# Patient Record
Sex: Male | Born: 1955 | Race: White | Hispanic: No | Marital: Single | Smoking: Never smoker
Health system: Southern US, Community
[De-identification: ages and names within clinical notes are randomized; demographics above are authoritative.]

## PROBLEM LIST (undated history)

## (undated) DIAGNOSIS — Z8739 Personal history of other diseases of the musculoskeletal system and connective tissue: Secondary | ICD-10-CM

## (undated) DIAGNOSIS — E039 Hypothyroidism, unspecified: Secondary | ICD-10-CM

## (undated) DIAGNOSIS — M1990 Unspecified osteoarthritis, unspecified site: Secondary | ICD-10-CM

## (undated) DIAGNOSIS — S83249A Other tear of medial meniscus, current injury, unspecified knee, initial encounter: Secondary | ICD-10-CM

## (undated) DIAGNOSIS — I10 Essential (primary) hypertension: Secondary | ICD-10-CM

## (undated) DIAGNOSIS — N529 Male erectile dysfunction, unspecified: Secondary | ICD-10-CM

## (undated) DIAGNOSIS — E785 Hyperlipidemia, unspecified: Secondary | ICD-10-CM

## (undated) DIAGNOSIS — G4730 Sleep apnea, unspecified: Secondary | ICD-10-CM

## (undated) DIAGNOSIS — M545 Low back pain, unspecified: Secondary | ICD-10-CM

## (undated) DIAGNOSIS — T7840XA Allergy, unspecified, initial encounter: Secondary | ICD-10-CM

## (undated) DIAGNOSIS — K219 Gastro-esophageal reflux disease without esophagitis: Secondary | ICD-10-CM

## (undated) DIAGNOSIS — N4889 Other specified disorders of penis: Secondary | ICD-10-CM

## (undated) DIAGNOSIS — Z87442 Personal history of urinary calculi: Secondary | ICD-10-CM

## (undated) DIAGNOSIS — G479 Sleep disorder, unspecified: Secondary | ICD-10-CM

## (undated) DIAGNOSIS — Z8719 Personal history of other diseases of the digestive system: Secondary | ICD-10-CM

## (undated) DIAGNOSIS — N189 Chronic kidney disease, unspecified: Secondary | ICD-10-CM

## (undated) DIAGNOSIS — E079 Disorder of thyroid, unspecified: Secondary | ICD-10-CM

## (undated) HISTORY — DX: Essential (primary) hypertension: I10

## (undated) HISTORY — DX: Disorder of thyroid, unspecified: E07.9

## (undated) HISTORY — DX: Chronic kidney disease, unspecified: N18.9

## (undated) HISTORY — DX: Allergy, unspecified, initial encounter: T78.40XA

## (undated) HISTORY — DX: Gastro-esophageal reflux disease without esophagitis: K21.9

## (undated) HISTORY — DX: Low back pain, unspecified: M54.50

## (undated) HISTORY — DX: Other specified disorders of penis: N48.89

## (undated) HISTORY — DX: Low back pain: M54.5

## (undated) HISTORY — DX: Male erectile dysfunction, unspecified: N52.9

## (undated) HISTORY — DX: Hyperlipidemia, unspecified: E78.5

## (undated) HISTORY — PX: TONSILLECTOMY: SUR1361

---

## 1999-01-21 ENCOUNTER — Emergency Department (HOSPITAL_COMMUNITY): Admission: EM | Admit: 1999-01-21 | Discharge: 1999-01-21 | Payer: Self-pay | Admitting: Emergency Medicine

## 2005-06-02 ENCOUNTER — Encounter (INDEPENDENT_AMBULATORY_CARE_PROVIDER_SITE_OTHER): Payer: Self-pay | Admitting: Specialist

## 2005-06-02 ENCOUNTER — Ambulatory Visit (HOSPITAL_BASED_OUTPATIENT_CLINIC_OR_DEPARTMENT_OTHER): Admission: RE | Admit: 2005-06-02 | Discharge: 2005-06-02 | Payer: Self-pay | Admitting: Urology

## 2005-10-01 ENCOUNTER — Inpatient Hospital Stay (HOSPITAL_COMMUNITY): Admission: EM | Admit: 2005-10-01 | Discharge: 2005-10-02 | Payer: Self-pay | Admitting: *Deleted

## 2005-10-01 ENCOUNTER — Ambulatory Visit: Payer: Self-pay | Admitting: *Deleted

## 2005-10-01 ENCOUNTER — Emergency Department (HOSPITAL_COMMUNITY): Admission: EM | Admit: 2005-10-01 | Discharge: 2005-10-01 | Payer: Self-pay | Admitting: Emergency Medicine

## 2009-05-28 ENCOUNTER — Inpatient Hospital Stay (HOSPITAL_COMMUNITY): Admission: EM | Admit: 2009-05-28 | Discharge: 2009-05-29 | Payer: Self-pay | Admitting: Emergency Medicine

## 2009-07-02 ENCOUNTER — Ambulatory Visit: Payer: Self-pay | Admitting: Psychiatry

## 2009-07-02 ENCOUNTER — Inpatient Hospital Stay (HOSPITAL_COMMUNITY): Admission: EM | Admit: 2009-07-02 | Discharge: 2009-07-03 | Payer: Self-pay | Admitting: Emergency Medicine

## 2010-05-05 LAB — COMPREHENSIVE METABOLIC PANEL
ALT: 23 U/L (ref 0–53)
AST: 19 U/L (ref 0–37)
Albumin: 3.4 g/dL — ABNORMAL LOW (ref 3.5–5.2)
Alkaline Phosphatase: 62 U/L (ref 39–117)
BUN: 5 mg/dL — ABNORMAL LOW (ref 6–23)
CO2: 23 mEq/L (ref 19–32)
Calcium: 9.7 mg/dL (ref 8.4–10.5)
Chloride: 116 mEq/L — ABNORMAL HIGH (ref 96–112)
Creatinine, Ser: 0.94 mg/dL (ref 0.4–1.5)
GFR calc Af Amer: >60 mL/min (ref >60)
GFR calc non Af Amer: >60 mL/min (ref >60)
Glucose, Bld: 100 mg/dL — ABNORMAL HIGH (ref 70–99)
Potassium: 3.7 mEq/L (ref 3.5–5.1)
Sodium: 142 mEq/L (ref 135–145)
Total Bilirubin: 0.6 mg/dL (ref 0.3–1.2)
Total Protein: 6 g/dL (ref 6.0–8.3)

## 2010-05-05 LAB — RAPID URINE DRUG SCREEN, HOSP PERFORMED
Amphetamines: NOT DETECTED
Barbiturates: NOT DETECTED
Benzodiazepines: NOT DETECTED
Cocaine: NOT DETECTED
Opiates: POSITIVE — AB
Tetrahydrocannabinol: NOT DETECTED

## 2010-05-05 LAB — HEPATIC FUNCTION PANEL
ALT: 25 U/L (ref 0–53)
ALT: 27 U/L (ref 0–53)
ALT: 31 U/L (ref 0–53)
ALT: 33 U/L (ref 0–53)
AST: 19 U/L (ref 0–37)
AST: 22 U/L (ref 0–37)
AST: 23 U/L (ref 0–37)
AST: 31 U/L (ref 0–37)
Albumin: 3.4 g/dL — ABNORMAL LOW (ref 3.5–5.2)
Albumin: 3.6 g/dL (ref 3.5–5.2)
Albumin: 3.6 g/dL (ref 3.5–5.2)
Albumin: 3.8 g/dL (ref 3.5–5.2)
Alkaline Phosphatase: 58 U/L (ref 39–117)
Alkaline Phosphatase: 60 U/L (ref 39–117)
Alkaline Phosphatase: 61 U/L (ref 39–117)
Alkaline Phosphatase: 70 U/L (ref 39–117)
Bilirubin, Direct: 0.1 mg/dL (ref 0.0–0.3)
Bilirubin, Direct: <0.1 mg/dL (ref 0.0–0.3)
Bilirubin, Direct: <0.1 mg/dL (ref 0.0–0.3)
Bilirubin, Direct: <0.1 mg/dL (ref 0.0–0.3)
Indirect Bilirubin: 0.7 mg/dL (ref 0.3–0.9)
Total Bilirubin: 0.6 mg/dL (ref 0.3–1.2)
Total Bilirubin: 0.8 mg/dL (ref 0.3–1.2)
Total Bilirubin: 0.8 mg/dL (ref 0.3–1.2)
Total Bilirubin: 0.9 mg/dL (ref 0.3–1.2)
Total Protein: 5.9 g/dL — ABNORMAL LOW (ref 6.0–8.3)
Total Protein: 6.2 g/dL (ref 6.0–8.3)
Total Protein: 6.5 g/dL (ref 6.0–8.3)
Total Protein: 6.6 g/dL (ref 6.0–8.3)

## 2010-05-05 LAB — BASIC METABOLIC PANEL
BUN: 13 mg/dL (ref 6–23)
CO2: 26 mEq/L (ref 19–32)
Calcium: 10.2 mg/dL (ref 8.4–10.5)
Chloride: 106 mEq/L (ref 96–112)
Creatinine, Ser: 1.2 mg/dL (ref 0.4–1.5)
GFR calc Af Amer: >60 mL/min (ref >60)
GFR calc non Af Amer: >60 mL/min (ref >60)
Glucose, Bld: 121 mg/dL — ABNORMAL HIGH (ref 70–99)
Potassium: 4.1 mEq/L (ref 3.5–5.1)
Sodium: 135 mEq/L (ref 135–145)

## 2010-05-05 LAB — CBC
HCT: 37 % — ABNORMAL LOW (ref 39.0–52.0)
HCT: 40 % (ref 39.0–52.0)
Hemoglobin: 12.4 g/dL — ABNORMAL LOW (ref 13.0–17.0)
Hemoglobin: 13.7 g/dL (ref 13.0–17.0)
MCHC: 33.6 g/dL (ref 30.0–36.0)
MCHC: 34.3 g/dL (ref 30.0–36.0)
MCV: 86.7 fL (ref 78.0–100.0)
MCV: 87.3 fL (ref 78.0–100.0)
Platelets: 113 10*3/uL — ABNORMAL LOW (ref 150–400)
Platelets: 132 10*3/uL — ABNORMAL LOW (ref 150–400)
RBC: 4.24 MIL/uL (ref 4.22–5.81)
RBC: 4.62 MIL/uL (ref 4.22–5.81)
RDW: 14.3 % (ref 11.5–15.5)
RDW: 14.4 % (ref 11.5–15.5)
WBC: 6.3 10*3/uL (ref 4.0–10.5)
WBC: 6.6 10*3/uL (ref 4.0–10.5)

## 2010-05-05 LAB — DIFFERENTIAL
Basophils Absolute: 0 10*3/uL (ref 0.0–0.1)
Basophils Relative: 0 % (ref 0–1)
Eosinophils Absolute: 0 10*3/uL (ref 0.0–0.7)
Eosinophils Relative: 1 % (ref 0–5)
Lymphocytes Relative: 20 % (ref 12–46)
Lymphs Abs: 1.3 10*3/uL (ref 0.7–4.0)
Monocytes Absolute: 0.5 10*3/uL (ref 0.1–1.0)
Monocytes Relative: 7 % (ref 3–12)
Neutro Abs: 4.7 10*3/uL (ref 1.7–7.7)
Neutrophils Relative %: 72 % (ref 43–77)

## 2010-05-05 LAB — SALICYLATE LEVEL: Salicylate Lvl: <4 mg/dL (ref 2.8–20.0)

## 2010-05-05 LAB — PROTIME-INR
INR: 1.12 (ref 0.00–1.49)
INR: 1.13 (ref 0.00–1.49)
Prothrombin Time: 14.3 seconds (ref 11.6–15.2)
Prothrombin Time: 14.4 seconds (ref 11.6–15.2)

## 2010-05-05 LAB — ACETAMINOPHEN LEVEL
Acetaminophen (Tylenol), Serum: 10.7 ug/mL (ref 10–30)
Acetaminophen (Tylenol), Serum: 68.5 ug/mL — ABNORMAL HIGH (ref 10–30)
Acetaminophen (Tylenol), Serum: <10 ug/mL — ABNORMAL LOW (ref 10–30)

## 2010-05-05 LAB — APTT: aPTT: 34 seconds (ref 24–37)

## 2010-05-05 LAB — ETHANOL: Alcohol, Ethyl (B): <5 mg/dL (ref 0–10)

## 2010-05-07 LAB — LIPASE, BLOOD: Lipase: 30 U/L (ref 11–59)

## 2010-05-07 LAB — BASIC METABOLIC PANEL WITH GFR
BUN: 13 mg/dL (ref 6–23)
BUN: 14 mg/dL (ref 6–23)
CO2: 22 meq/L (ref 19–32)
CO2: 27 meq/L (ref 19–32)
Calcium: 10.5 mg/dL (ref 8.4–10.5)
Calcium: 11.1 mg/dL — ABNORMAL HIGH (ref 8.4–10.5)
Chloride: 109 meq/L (ref 96–112)
Chloride: 109 meq/L (ref 96–112)
Creatinine, Ser: 1.2 mg/dL (ref 0.4–1.5)
Creatinine, Ser: 1.32 mg/dL (ref 0.4–1.5)
GFR calc non Af Amer: 57 mL/min — ABNORMAL LOW
GFR calc non Af Amer: >60 mL/min
Glucose, Bld: 103 mg/dL — ABNORMAL HIGH (ref 70–99)
Glucose, Bld: 112 mg/dL — ABNORMAL HIGH (ref 70–99)
Potassium: 3.8 meq/L (ref 3.5–5.1)
Potassium: 4.6 meq/L (ref 3.5–5.1)
Sodium: 139 meq/L (ref 135–145)
Sodium: 141 meq/L (ref 135–145)

## 2010-05-07 LAB — URINALYSIS, ROUTINE W REFLEX MICROSCOPIC
Bilirubin Urine: NEGATIVE
Glucose, UA: NEGATIVE mg/dL
Hgb urine dipstick: NEGATIVE
Ketones, ur: NEGATIVE mg/dL
Nitrite: NEGATIVE
Protein, ur: NEGATIVE mg/dL
Specific Gravity, Urine: 1.006 (ref 1.005–1.030)
Urobilinogen, UA: 0.2 mg/dL (ref 0.0–1.0)
pH: 7 (ref 5.0–8.0)

## 2010-05-07 LAB — HEPATIC FUNCTION PANEL
ALT: 23 U/L (ref 0–53)
AST: 23 U/L (ref 0–37)
Albumin: 3.7 g/dL (ref 3.5–5.2)
Alkaline Phosphatase: 69 U/L (ref 39–117)
Bilirubin, Direct: <0.1 mg/dL (ref 0.0–0.3)
Total Bilirubin: 0.7 mg/dL (ref 0.3–1.2)
Total Protein: 5.6 g/dL — ABNORMAL LOW (ref 6.0–8.3)

## 2010-05-07 LAB — CARDIAC PANEL(CRET KIN+CKTOT+MB+TROPI)
CK, MB: 2.3 ng/mL (ref 0.3–4.0)
CK, MB: 2.8 ng/mL (ref 0.3–4.0)
Relative Index: 0.5 (ref 0.0–2.5)
Relative Index: 1.1 (ref 0.0–2.5)
Total CK: 257 U/L — ABNORMAL HIGH (ref 7–232)
Total CK: 493 U/L — ABNORMAL HIGH (ref 7–232)
Troponin I: 0.02 ng/mL (ref 0.00–0.06)
Troponin I: <0.01 ng/mL (ref 0.00–0.06)

## 2010-05-07 LAB — CK TOTAL AND CKMB (NOT AT ARMC)
CK, MB: 3.3 ng/mL (ref 0.3–4.0)
Relative Index: 1.2 (ref 0.0–2.5)
Total CK: 275 U/L — ABNORMAL HIGH (ref 7–232)

## 2010-05-07 LAB — DIFFERENTIAL
Basophils Absolute: 0 10*3/uL (ref 0.0–0.1)
Basophils Relative: 1 % (ref 0–1)
Eosinophils Absolute: 0.1 10*3/uL (ref 0.0–0.7)
Eosinophils Relative: 1 % (ref 0–5)
Lymphocytes Relative: 25 % (ref 12–46)
Lymphs Abs: 2.2 10*3/uL (ref 0.7–4.0)
Monocytes Absolute: 0.5 10*3/uL (ref 0.1–1.0)
Monocytes Relative: 6 % (ref 3–12)
Neutro Abs: 6.1 10*3/uL (ref 1.7–7.7)
Neutrophils Relative %: 68 % (ref 43–77)

## 2010-05-07 LAB — CBC
HCT: 38.5 % — ABNORMAL LOW (ref 39.0–52.0)
HCT: 39.7 % (ref 39.0–52.0)
Hemoglobin: 13.3 g/dL (ref 13.0–17.0)
Hemoglobin: 13.4 g/dL (ref 13.0–17.0)
MCHC: 33.8 g/dL (ref 30.0–36.0)
MCHC: 34.7 g/dL (ref 30.0–36.0)
MCV: 86.7 fL (ref 78.0–100.0)
MCV: 87.1 fL (ref 78.0–100.0)
Platelets: 123 10*3/uL — ABNORMAL LOW (ref 150–400)
Platelets: 134 10*3/uL — ABNORMAL LOW (ref 150–400)
RBC: 4.42 MIL/uL (ref 4.22–5.81)
RBC: 4.57 MIL/uL (ref 4.22–5.81)
RDW: 14.1 % (ref 11.5–15.5)
RDW: 14.3 % (ref 11.5–15.5)
WBC: 8 10*3/uL (ref 4.0–10.5)
WBC: 8.9 10*3/uL (ref 4.0–10.5)

## 2010-05-07 LAB — POCT CARDIAC MARKERS
CKMB, poc: 2.2 ng/mL (ref 1.0–8.0)
CKMB, poc: 2.3 ng/mL (ref 1.0–8.0)
Myoglobin, poc: 145 ng/mL (ref 12–200)
Myoglobin, poc: 189 ng/mL (ref 12–200)
Troponin i, poc: <0.05 ng/mL (ref 0.00–0.09)
Troponin i, poc: <0.05 ng/mL (ref 0.00–0.09)

## 2010-05-07 LAB — TROPONIN I: Troponin I: 0.02 ng/mL (ref 0.00–0.06)

## 2010-07-04 NOTE — Op Note (Signed)
NAME:  Thomas Pham, Thomas Pham                ACCOUNT NO.:  [id number]   MEDICAL RECORD NO.:  [id number]          PATIENT TYPE:  AMB   LOCATION:  [facility]                         FACILITY:  [facility]   PHYSICIAN:  Boston Service, M.D.DATE OF BIRTH:  Jun 22, 1955   DATE OF PROCEDURE:  06/02/2005  DATE OF DISCHARGE:                                 OPERATIVE REPORT   PREOPERATIVE DIAGNOSIS:  Recurrent infected sebaceous cyst penile shaft.   POSTOPERATIVE DIAGNOSIS:  Recurrent infected sebaceous cyst penile shaft.   PROCEDURE:  Redo circumcision.   SURGEON:  Boston Service, M.D.   ASSISTANT:  None.   ANESTHESIA:  IV sedation and a penile block.   SPECIMENS:  Infected sebaceous cyst.   ESTIMATED BLOOD LOSS:  Minimal.   COMPLICATIONS:  None obvious.   DESCRIPTION OF PROCEDURE:  55 year old male presented to the office May 18, 2005, with a history of a recurrent infected sebaceous cyst distal penile  shaft, has varied in size from about 2.5 to 1.5 cm. No obvious evidence of  inguinal adenopathy. Bilaterally descended testes.  DRE shows a nontender  non nodular prostate.  After a lengthy discussion with the patient and  review of what I think are reasonable risks, benefits and alternatives, he  elected excision of the sebaceous cyst with redo circumcision.   DESCRIPTION OF PROCEDURE:  The patient was prepped and draped in the supine  position after an institution of adequate level of intravenous sedation.  A  penile block with 0.25% lidocaine without epinephrine was instituted.  Once  the block was allowed to set up, a circumferential incision was made  proximal to the infected sebaceous cyst, a similar incision was made  proximal to the original incision, and a ring of densely fibrotic skin was  removed  in a parallel lines technique.  The subcutaneous tissue was reapproximated  with interrupted sutures of 3-0 Vicryl and the skin was reapproximated with  interrupted sutures of 3-0 chromic.   The wound was covered with bacitracin  ointment, dry gauze, and Coban tape.  The patient was returned to recovery  in satisfactory condition.           ______________________________  Boston Service, M.D.     RH/MEDQ  D:  06/02/2005  T:  06/02/2005  Job:  [id number]   cc:   Dellis Anes. Idell Pickles, M.D.  Fax: [phone]

## 2010-07-04 NOTE — Discharge Summary (Signed)
NAME:  Thomas Pham, BRUNSMAN NO.:  [id number]   MEDICAL RECORD NO.:  [id number]          PATIENT TYPE:  IPS   LOCATION:  0301                          FACILITY:  [facility]   PHYSICIAN:  Jasmine Pang, M.D. DATE OF BIRTH:  September 09, 1955   DATE OF ADMISSION:  10/01/2005  DATE OF DISCHARGE:  10/02/2005                                 DISCHARGE SUMMARY   IDENTIFYING INFORMATION:  The patient is a 55 year old single Caucasian  male, who was admitted on a voluntary basis on October 01, 2005, to my  service.   HISTORY OF PRESENT ILLNESS:  The patient has had a history of depression  recently.  He has had increased stress with financial problems.  He also had  conflict with his best friend, who is a male.  He feels that this caused  irreparable damage.  He feels hopeless and helpless over the end of the  relationship.  He has been having passive suicidal ideation.  He states that  the male he has the relationship with has been in rehab before and is only  81 years old.  He states he met her at a bar.  He reports that neither her  family nor his family like the relationship.  The patient also has some  legal charges that are of unclear nature.  He is currently wearing an ankle  bracelet and has to be monitored, 24/7, by his parole officer.  He was vague  when talking about the legal issues.   PAST PSYCHIATRIC HISTORY:  This is the patient's first [facility] admission.  The  patient does see Abel Presto for therapy at [facility].   FAMILY HISTORY:  The patient denies.   SUBSTANCE ABUSE HISTORY:  The patient is a nonsmoker.  He denies any abuse  of drugs or alcohol.   PAST MEDICAL HISTORY:  Medical problems:  Hypertension, hyperlipidemia.   MEDICATIONS:  1. Micardis.  2. Baby aspirin 81 mg.  3. Lipitor 10 mg.   DRUG ALLERGIES:  No known drug allergies.   POSITIVE PHYSICAL FINDINGS:  GENERAL:  The patient was a middle-aged male,  in no acute  distress, wearing a black ankle bracelet.  His physical exam was  done in [facility].  He was found to be grossly healthy.   ADMISSION LABORATORY DATA:  CBC was within normal limits.  Alcohol was less  than 5.  UDS was negative.  BMET was within normal limits.   HOSPITAL COURSE:  Upon admission, the patient was continued on his Lipitor  10 mg p.o. daily, aspirin 81 mg p.o. daily and Micardis/hydrochlorothiazide  40/12.5 mg.  On October 01, 2005, he was also started on Ambien 10 mg p.o.  q.h.s. p.r.n. insomnia.  He stated he has had a significant problem with  insomnia.  He tolerated these medications well and particularly was happy  with getting a good night's sleep.  The patient felt better on the second  day of admission.  He again reiterated he had no intention to harm himself.  He discussed his financial problems and his  problem with his male best  friend.  He feels their relationship is possibly over.  He states he became  upset and stressed.  He made a statement to his friend that, if he was going  to hurt himself, how I would do it.  He states he would never harm himself  and had no intention of doing this.  He was being somewhat manipulative with  his friend, who he was frustrated with.  He has a lot of support from  family.  He also has a lot of support from his work at [employer].  His  parents contacted him last night and stated they would help him financially  with some of this burden.  This was a relief for him.  At the time of  discharge, the patient's mental status had improved markedly.  Mood was less  depressed and anxious, affect wider range, no suicidal and homicidal  ideation, no self-injurious behavior, no auditory or visual hallucinations,  no paranoia or delusions.  Thought processes were logical, linear and goal-  directed.  Thought content revolved still around his male friend, but he  stated he was not going to try to contact her.  She is also out of town  for  a period of time.  He will return home to live with his family.  Cognitive  was grossly within normal limits.  He will return to therapy with Abel Presto  and be assigned a psychiatrist in that practice.  I did not start an  antidepressant during this brief admission, since I would not have a way to  follow up on any possible side effects.   DISCHARGE DIAGNOSES:  AXIS I.  1. Depressive disorder, NOS.  2. Anxiety disorder, NOS.  AXIS II.  None.  AXIS III.  Hypertension, hypercholesterolemia.  AXIS IV.  Severe (economic problem, problems related to the legal system,  other psychosocial problems).  AXIS V.  Current GAF is 43.  GAF upon admission was 35.  GAF highest past  year was 70-75.   DISCHARGE PLANS:  There were no specific activity level or dietary  restrictions.   DISCHARGE MEDICATIONS:  1. Lipitor 10 mg every 6 p.m.  2. Aspirin 81 mg daily.  3. Avapro 150 mg daily.  4. Hydrochlorothiazide 12.5 mg in the a.m.  5. Ambien 10 mg at bedtime as needed for sleep.  6. Klonopin 0.5 mg one pill up to three times daily, if needed for      anxiety.   POST-HOSPITAL CARE PLANS:  The patient has an appointment with Abel Presto,  therapist, on October 06, 2005, at 3 p.m.  He will also be assigned a  psychiatrist in this practice.      Jasmine Pang, M.D.  Electronically Signed     BHS/MEDQ  D:  10/02/2005  T:  10/02/2005  Job:  [id number]

## 2011-02-17 HISTORY — PX: THYROIDECTOMY: SHX17

## 2011-05-31 IMAGING — CR DG CHEST 2V
2 series · 2 of 2 positions shown · non-contrast
Comparison: None

CLINICAL DATA: Chest pain.

CHEST - 2 VIEW

[t chest supine]
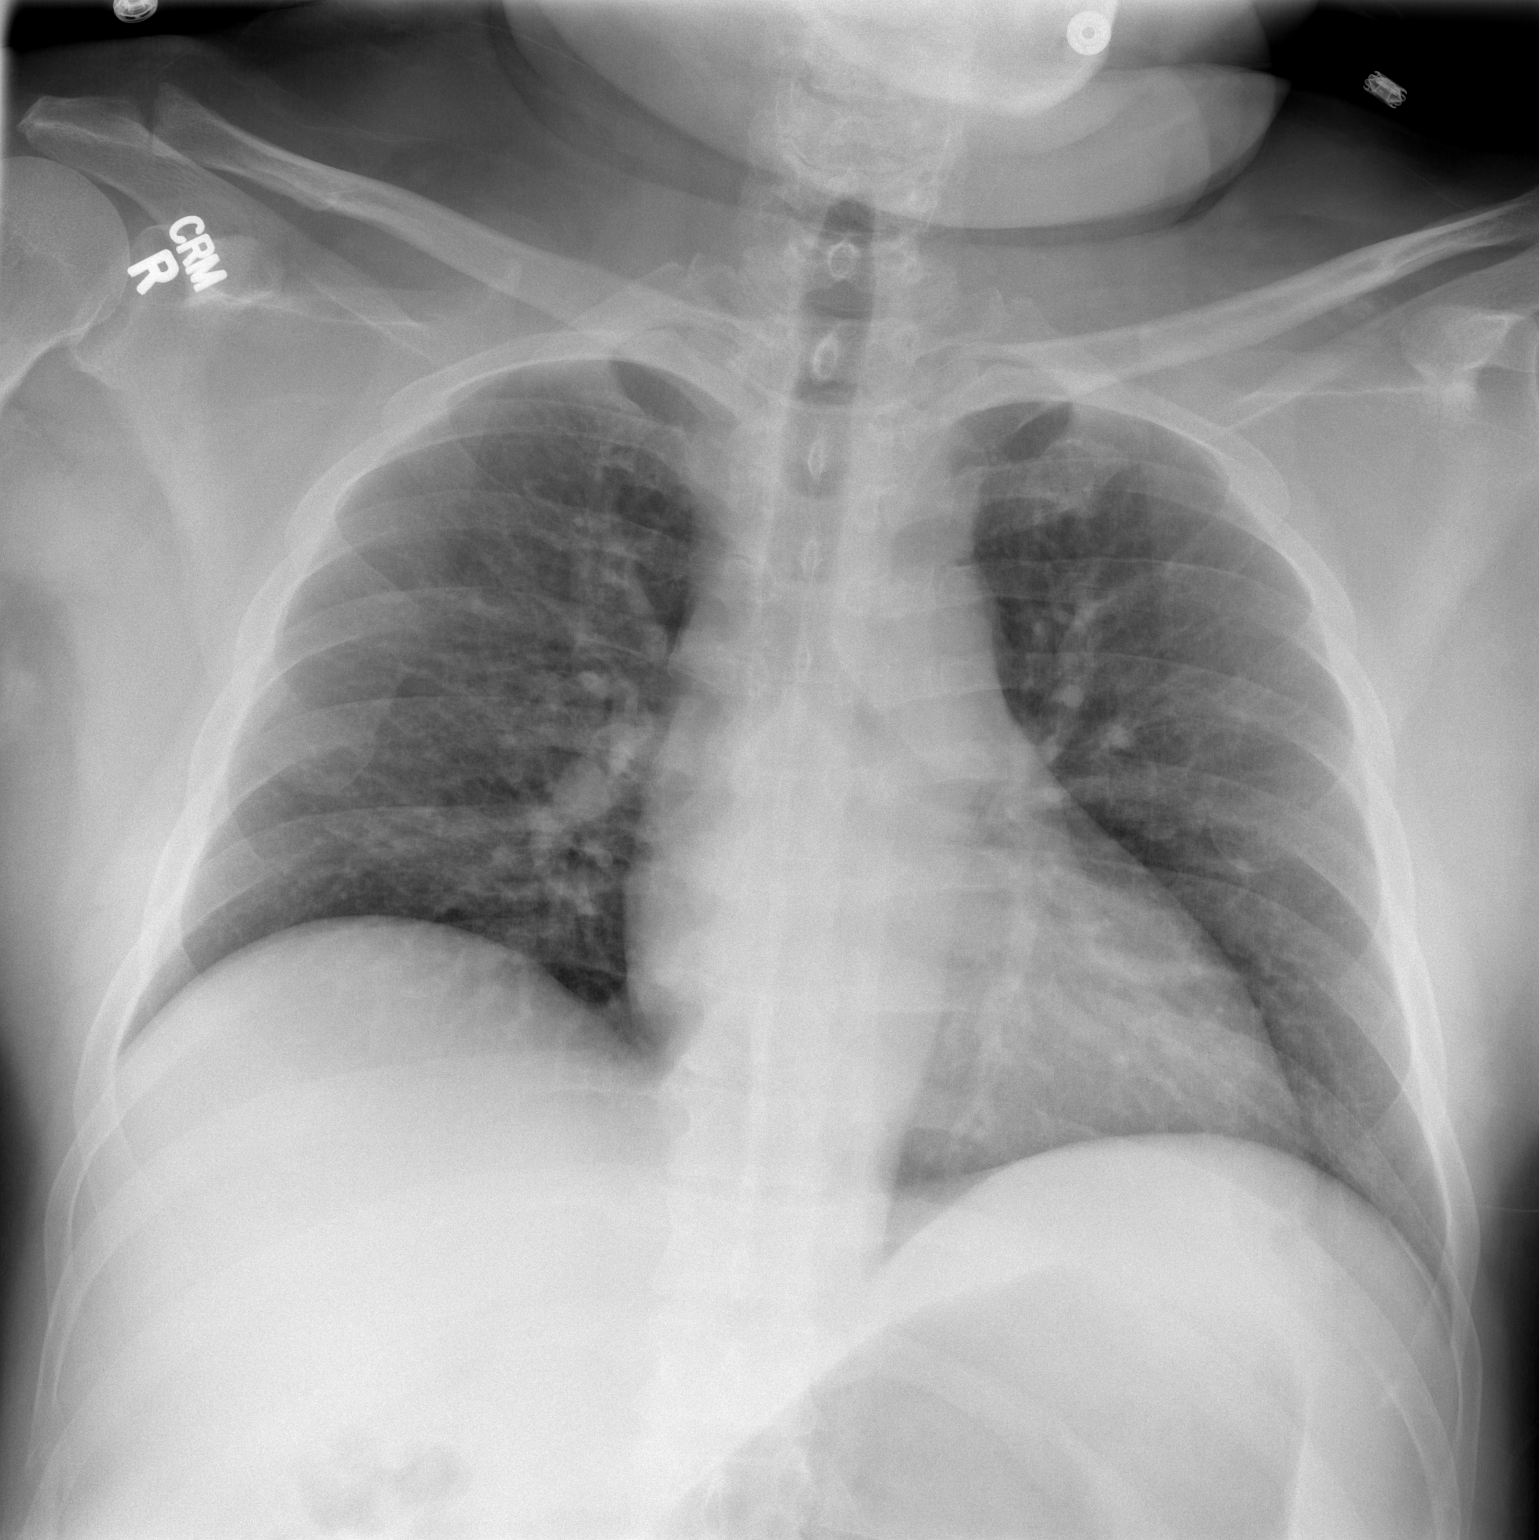

[t chest supine *]
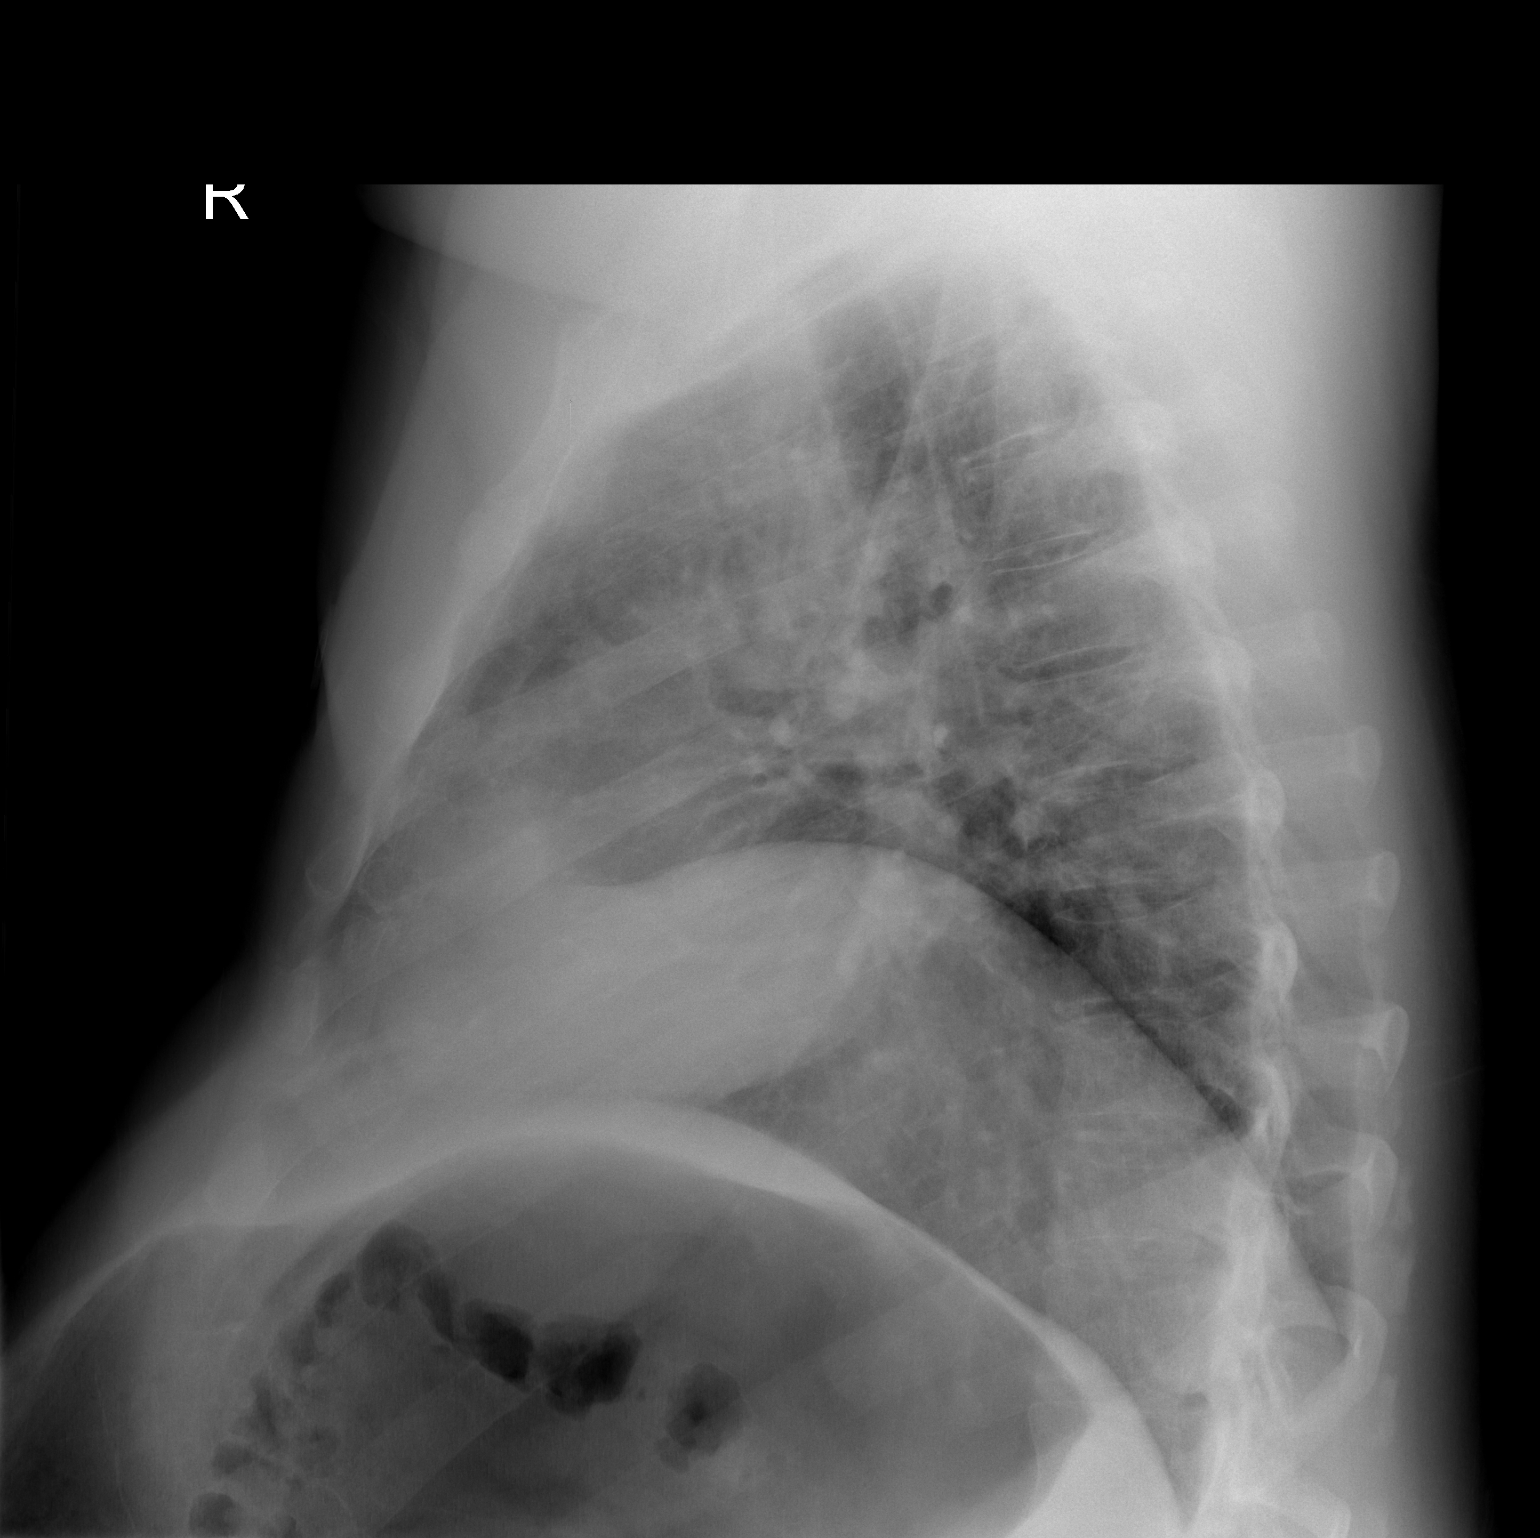

[2 of 2 positions shown; findings below may reference images not displayed]

FINDINGS: The cardiac silhouette, mediastinal and hilar contours
are within normal limits.  The lungs are clear.  The bony thorax is
intact.  Gaseous distention of the stomach is noted.
IMPRESSION: No acute cardiopulmonary findings.
Gaseous distention of the stomach.

## 2011-05-31 IMAGING — CT CT ABD-PELV W/ CM
2 of 5 series · 17 of 46 positions shown, 19 images · IV contrast (agent unspecified)
Comparison: None

CLINICAL DATA: Abdominal pain.

CT ABDOMEN AND PELVIS WITH CONTRAST
TECHNIQUE: Multidetector CT imaging of the abdomen and pelvis was
performed following the standard protocol during bolus
administration of intravenous contrast.
Contrast: 100 ml of Hmnipaque-OVV

[Series 2: routine abdomen · axial · 0.82mm/px · z∈[-492,-42]mm · 14 of 101 slices shown, 16 images]
[im 6/101  soft-tissue]
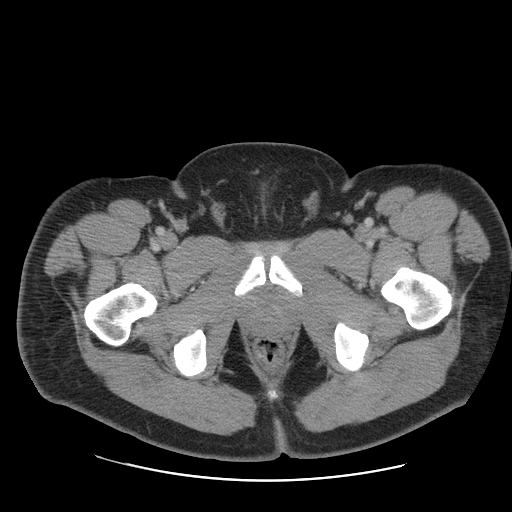
[im 6/101  bone]
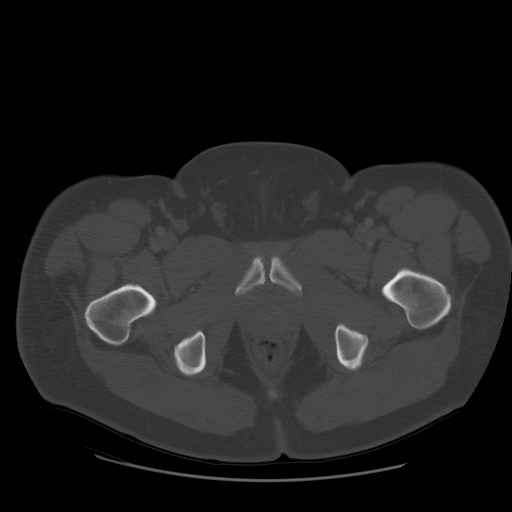
[im 16/101  soft-tissue]
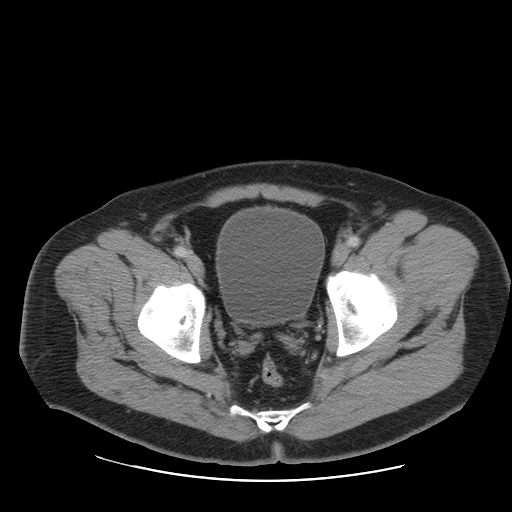
[im 21/101  soft-tissue]
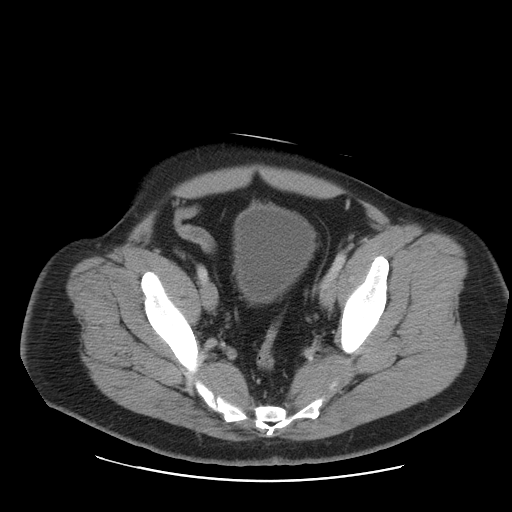
[im 26/101  soft-tissue]
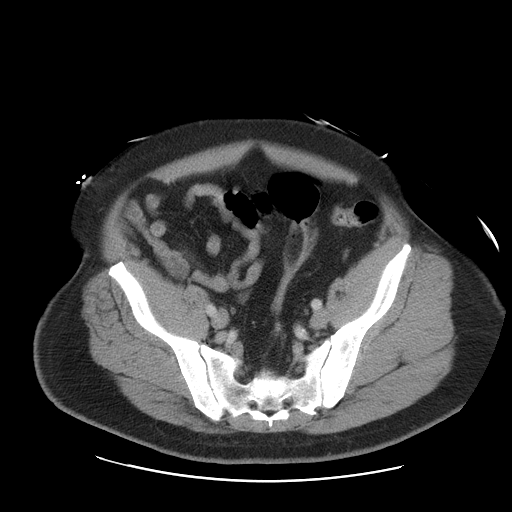
[im 36/101  soft-tissue]
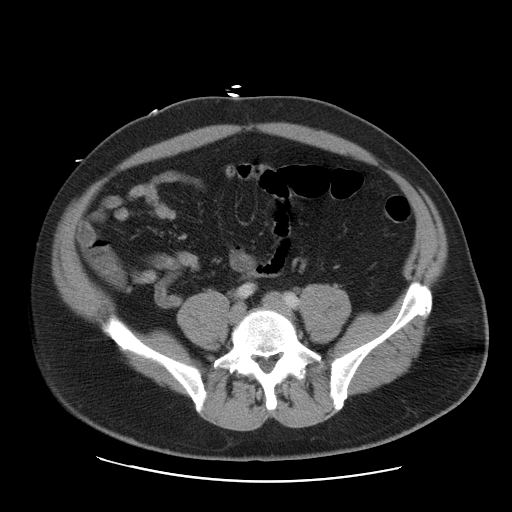
[im 41/101  soft-tissue]
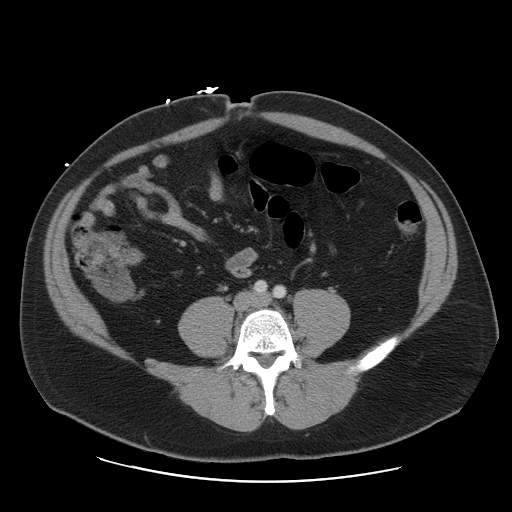
[im 46/101  soft-tissue]
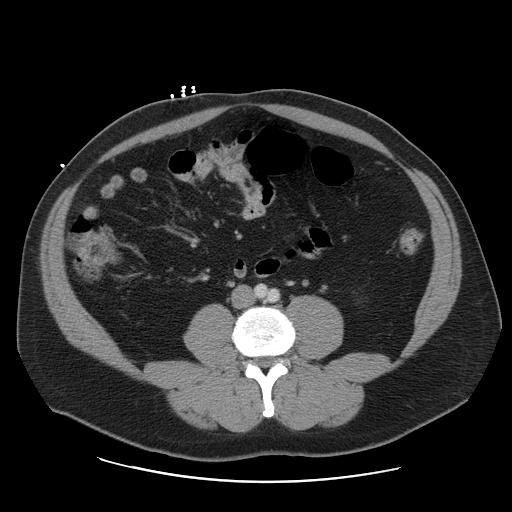
[im 56/101  soft-tissue]
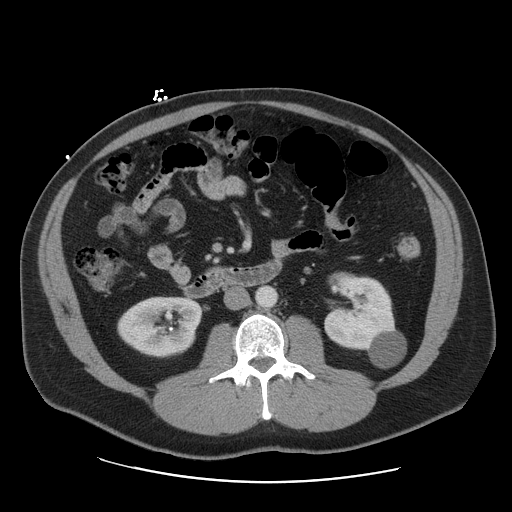
[im 61/101  soft-tissue]
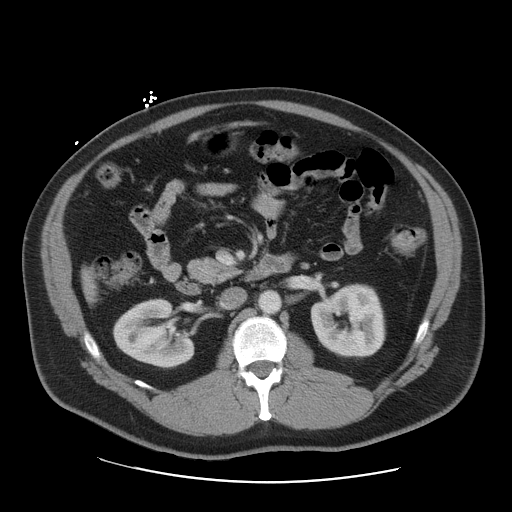
[im 61/101  bone]
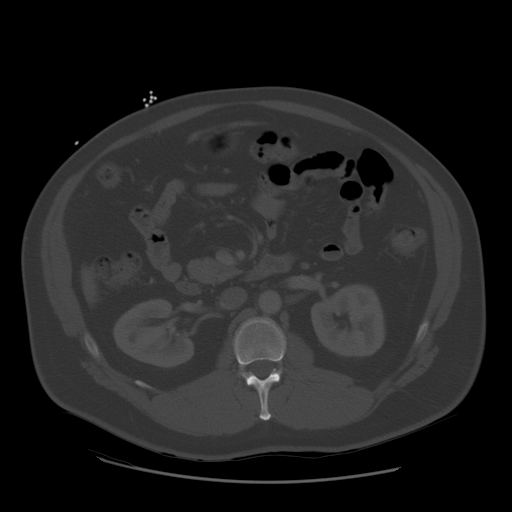
[im 66/101  soft-tissue]
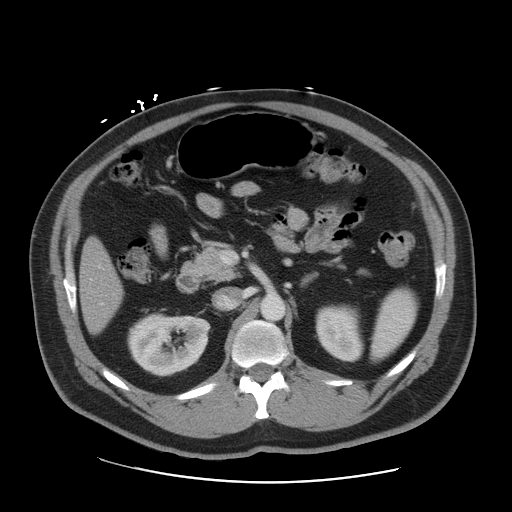
[im 76/101  soft-tissue]
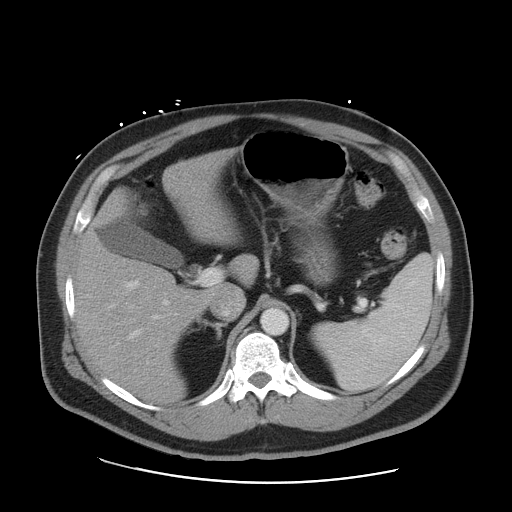
[im 81/101  soft-tissue]
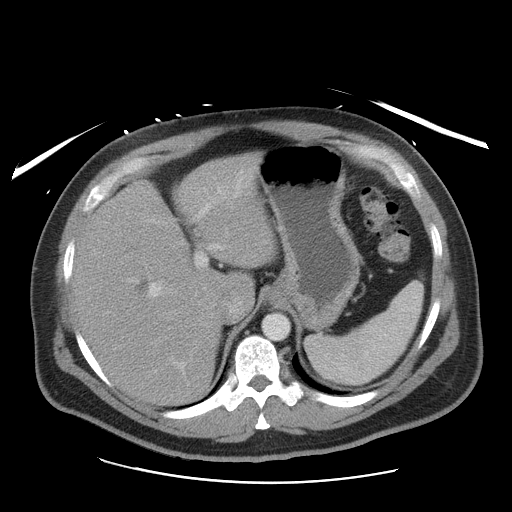
[im 86/101  soft-tissue]
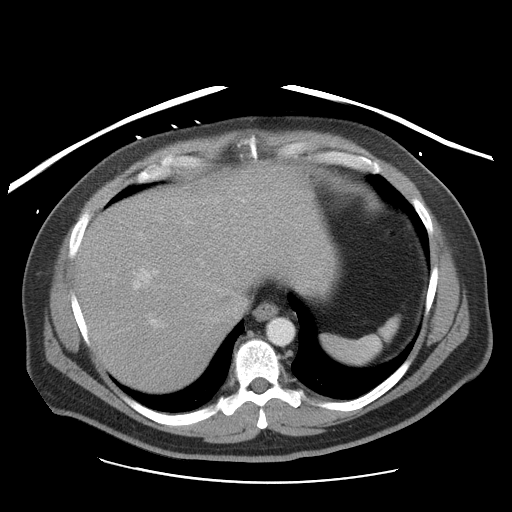
[im 96/101  soft-tissue]
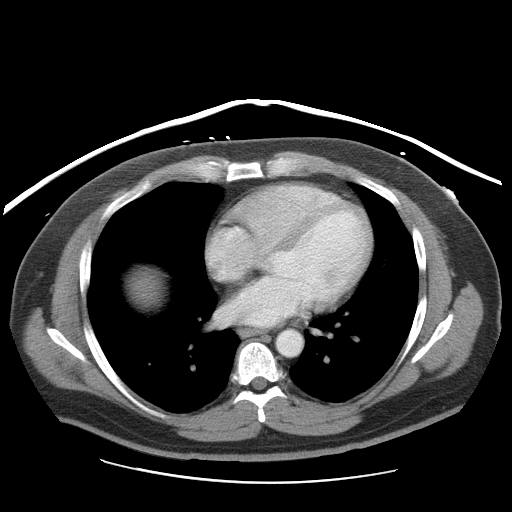

[Series 401: reformatted · coronal · 1.05mm/px · 3 of 135 slices shown]
[im 45/135  soft-tissue]
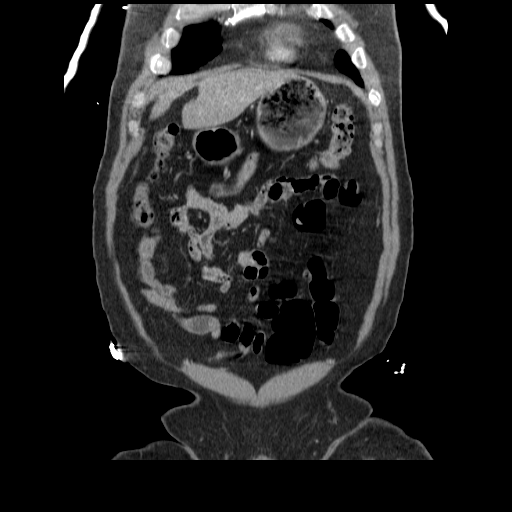
[im 60/135  soft-tissue]
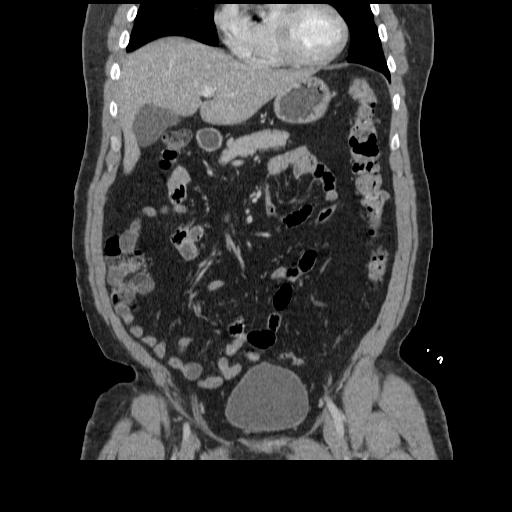
[im 75/135  soft-tissue]
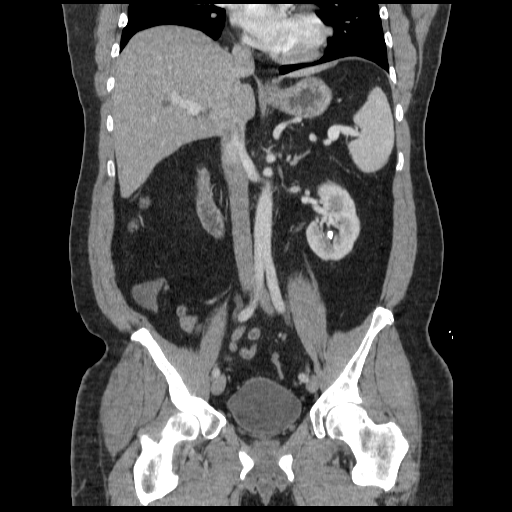

[17 of 46 positions shown; findings below may reference images not displayed]

FINDINGS: The lung bases are clear except for dependent
atelectasis.

There is diffuse fatty infiltration of the liver and a few small
scattered low attenuation lesions which are likely benign cysts.
No biliary dilatation.  The gallbladder appears normal.  The
pancreas is normal.  The spleen is normal in size.  The adrenal
glands and kidneys are unremarkable except for small bilateral
renal calculi and a left renal cyst.  No hydronephrosis.

The stomach, duodenum, small bowel and colon are unremarkable.  No
mesenteric or retroperitoneal masses or adenopathy.  There are
scattered colonic diverticuli.  The appendix is normal.

The aorta is normal in caliber.  The major branch vessels are
normal.  No retroperitoneal process.

The bladder appears normal.  Prostate gland and seminal vesicles
are normal.  No inguinal mass or hernia.

The bony structures are unremarkable.
IMPRESSION: No acute abdominal/pelvic findings, mass lesions or adenopathy.
Bilateral renal calculi.
Mild diffuse fatty infiltration of the liver.

## 2012-07-08 ENCOUNTER — Encounter: Payer: Self-pay | Admitting: Vascular Surgery

## 2012-08-22 ENCOUNTER — Encounter: Payer: Self-pay | Admitting: Vascular Surgery

## 2012-08-23 ENCOUNTER — Ambulatory Visit (INDEPENDENT_AMBULATORY_CARE_PROVIDER_SITE_OTHER): Payer: BC Managed Care – PPO | Admitting: Vascular Surgery

## 2012-08-23 ENCOUNTER — Encounter: Payer: Self-pay | Admitting: Vascular Surgery

## 2012-08-23 VITALS — BP 124/84 | HR 87 | Resp 18 | Ht 68.0 in | Wt 238.0 lb

## 2012-08-23 DIAGNOSIS — R238 Other skin changes: Secondary | ICD-10-CM

## 2012-08-23 DIAGNOSIS — R209 Unspecified disturbances of skin sensation: Secondary | ICD-10-CM | POA: Insufficient documentation

## 2012-08-23 NOTE — Progress Notes (Signed)
[facility] [place]   Patient name: Thomas Pham MRN: [id number] DOB: 1955-03-09 Sex: male   Referred by: Creta Levin  Reason for referral:  Chief Complaint  Patient presents with  . New Evaluation    Patient is c/o coldness in hands, feet and groin areas. Referred by Dr Tonna Corner    HISTORY OF PRESENT ILLNESS: The patient presents today for complaints of cold sensation in his hands feet and occasionally in his groin and inner thigh. He specifically denies any numbness or pain or tingling associated with this. He reports that in the wintertime he will have to keep the heater in his vehicle on high. He feels that he is hot everywhere but his fever remained cold. He can have the sensation in the summertime to reports that he is to wear extra blankets and even socks and the summertime to keep his feet were warm. He does not recall his feet feeling cold to the touch when he has a cold sensation. He specifically denies any color changes associated with his hands or feet during these episodes. He has no tissue loss.  Past Medical History  Diagnosis Date  . Chronic kidney disease   . Allergy   . Hypertension   . Thyroid disease   . GERD (gastroesophageal reflux disease)   . Hyperlipidemia   . Organic impotence   . Penile cyst   . Low back pain     Past Surgical History  Procedure Laterality Date  . Thyroidectomy Right   . Tonsillectomy      History   Social History  . Marital Status: Single    Spouse Name: N/A    Number of Children: N/A  . Years of Education: N/A   Occupational History  . Not on file.   Social History Main Topics  . Smoking status: Never Smoker   . Smokeless tobacco: Never Used  . Alcohol Use: No  . Drug Use: No  . Sexually Active: Not on file   Other Topics Concern  . Not on file   Social History Narrative  . No narrative on file    Family History  Problem Relation Age of Onset  . Hypertension Mother   . Diabetes  Mother     prediabetes  . Diabetes Father   . Heart disease Father   . Arthritis Father   . Other Father     alzheimer  . Hyperlipidemia Father   . Hypertension Father   . Stroke Maternal Grandmother   . Other Paternal Grandmother   . Diabetes Paternal Grandmother   . Heart disease Paternal Grandfather   . Other Paternal Grandfather     migraine headache    Allergies as of 08/23/2012  . (No Known Allergies)    Current Outpatient Prescriptions on File Prior to Visit  Medication Sig Dispense Refill  . aspirin 81 MG tablet Take 81 mg by mouth daily.      . Avanafil (STENDRA) 100 MG TABS Take 100 mg by mouth once.      . Calcium Carbonate-Vitamin D (OSCAL 500/200 D-3 PO) Take 500 mg by mouth daily.      Marland Kitchen HYDROcodone-acetaminophen (NORCO/VICODIN) 5-325 MG per tablet Take 1 tablet by mouth every 6 (six) hours as needed for pain.      . sildenafil (VIAGRA) 100 MG tablet Take 100 mg by mouth daily as needed for erectile dysfunction.      . simvastatin (ZOCOR) 20 MG tablet Take 20 mg by mouth  every evening.      Marland Kitchen amLODipine (NORVASC) 5 MG tablet Take 5 mg by mouth daily.       No current facility-administered medications on file prior to visit.     REVIEW OF SYSTEMS:  Positives indicated with an "X"  CARDIOVASCULAR:  [ ]  chest pain   [ ]  chest pressure   [ ]  palpitations   [ ]  orthopnea   [ ]  dyspnea on exertion   [ ]  claudication   [ ]  rest pain   [ ]  DVT   [ ]  phlebitis PULMONARY:   [ ]  productive cough   [ ]  asthma   [ ]  wheezing NEUROLOGIC:   [ ]  weakness  [ ]  paresthesias  [ ]  aphasia  [ ]  amaurosis  [ ]  dizziness HEMATOLOGIC:   [ ]  bleeding problems   [ ]  clotting disorders MUSCULOSKELETAL:  [ ]  joint pain   [ ]  joint swelling GASTROINTESTINAL: [ ]   blood in stool  [ ]   hematemesis GENITOURINARY:  [ ]   dysuria  [ ]   hematuria PSYCHIATRIC:  [ ]  history of major depression INTEGUMENTARY:  [x ] rashes  [ ]  ulcers CONSTITUTIONAL:  [ ]  fever   [x ] chills  PHYSICAL  EXAMINATION:  General: The patient is a well-nourished male, in no acute distress. Vital signs are BP 124/84  Pulse 87  Resp 18  Ht 5\' 8"  (1.727 m)  Wt 238 lb (107.956 kg)  BMI 36.2 kg/m2 Pulmonary: There is a good air exchange bilaterally without wheezing or rales. Abdomen: Soft and non-tender with normal pitch bowel sounds. Musculoskeletal: There are no major deformities.  There is no significant extremity pain. Neurologic: No focal weakness or paresthesias are detected, Skin: There are no ulcer or rashes noted. Psychiatric: The patient has normal affect. Cardiovascular: There is a regular rate and rhythm without significant murmur appreciated. Pulse status: 2+ radial 2+ femoral 2+ dorsalis pedis and 2+ posterior tibial pulses bilaterally Skin without any cyanosis or tissue loss    Impression and Plan:  I discussed this with the patient. I explained that he has no evidence of arterial insufficiency with a totally normal a peripheral stick vascular exam. He also does not have a clear-cut vasospastic disorder such as Raynaud. He has no color changes in his feet and hands are not cold to the touch when he has a cold sensation. I explained this is not a dangerous situation. He was concerned it to severe peripheral vascular disease in his father. I suspect that he will not have a clear-cut etiology for this. He will followup with his medical doctor for further evaluation. This does not appear to be a neuropathy since his symptom is cold and not numbness. He was reassured this discussion will see [place] again on an as-needed basis    Amyrie Illingworth [facility] [place] Office: [phone]

## 2012-09-05 ENCOUNTER — Other Ambulatory Visit: Payer: Self-pay | Admitting: Dermatology

## 2012-10-20 ENCOUNTER — Other Ambulatory Visit: Payer: Self-pay | Admitting: Dermatology

## 2012-11-29 ENCOUNTER — Emergency Department (HOSPITAL_COMMUNITY)
Admission: EM | Admit: 2012-11-29 | Discharge: 2012-11-29 | Disposition: A | Discharge status: Home / Self Care | Payer: BC Managed Care – PPO | Attending: Emergency Medicine | Admitting: Emergency Medicine

## 2012-11-29 ENCOUNTER — Emergency Department (HOSPITAL_COMMUNITY): Payer: BC Managed Care – PPO

## 2012-11-29 ENCOUNTER — Encounter (HOSPITAL_COMMUNITY): Payer: Self-pay | Admitting: Emergency Medicine

## 2012-11-29 DIAGNOSIS — J3489 Other specified disorders of nose and nasal sinuses: Secondary | ICD-10-CM | POA: Insufficient documentation

## 2012-11-29 DIAGNOSIS — T391X1A Poisoning by 4-Aminophenol derivatives, accidental (unintentional), initial encounter: Secondary | ICD-10-CM | POA: Insufficient documentation

## 2012-11-29 DIAGNOSIS — R0602 Shortness of breath: Secondary | ICD-10-CM | POA: Insufficient documentation

## 2012-11-29 DIAGNOSIS — Y939 Activity, unspecified: Secondary | ICD-10-CM | POA: Insufficient documentation

## 2012-11-29 DIAGNOSIS — R110 Nausea: Secondary | ICD-10-CM | POA: Insufficient documentation

## 2012-11-29 DIAGNOSIS — Z7982 Long term (current) use of aspirin: Secondary | ICD-10-CM | POA: Insufficient documentation

## 2012-11-29 DIAGNOSIS — Z79899 Other long term (current) drug therapy: Secondary | ICD-10-CM | POA: Insufficient documentation

## 2012-11-29 DIAGNOSIS — Z9109 Other allergy status, other than to drugs and biological substances: Secondary | ICD-10-CM | POA: Insufficient documentation

## 2012-11-29 DIAGNOSIS — R0789 Other chest pain: Secondary | ICD-10-CM | POA: Insufficient documentation

## 2012-11-29 DIAGNOSIS — G8929 Other chronic pain: Secondary | ICD-10-CM | POA: Insufficient documentation

## 2012-11-29 DIAGNOSIS — M545 Low back pain, unspecified: Secondary | ICD-10-CM | POA: Insufficient documentation

## 2012-11-29 DIAGNOSIS — E785 Hyperlipidemia, unspecified: Secondary | ICD-10-CM | POA: Insufficient documentation

## 2012-11-29 DIAGNOSIS — F329 Major depressive disorder, single episode, unspecified: Secondary | ICD-10-CM | POA: Insufficient documentation

## 2012-11-29 DIAGNOSIS — F39 Unspecified mood [affective] disorder: Secondary | ICD-10-CM

## 2012-11-29 DIAGNOSIS — F430 Acute stress reaction: Secondary | ICD-10-CM | POA: Insufficient documentation

## 2012-11-29 DIAGNOSIS — R51 Headache: Secondary | ICD-10-CM | POA: Insufficient documentation

## 2012-11-29 DIAGNOSIS — Y929 Unspecified place or not applicable: Secondary | ICD-10-CM | POA: Insufficient documentation

## 2012-11-29 DIAGNOSIS — K219 Gastro-esophageal reflux disease without esophagitis: Secondary | ICD-10-CM | POA: Insufficient documentation

## 2012-11-29 DIAGNOSIS — N189 Chronic kidney disease, unspecified: Secondary | ICD-10-CM | POA: Insufficient documentation

## 2012-11-29 DIAGNOSIS — N529 Male erectile dysfunction, unspecified: Secondary | ICD-10-CM | POA: Insufficient documentation

## 2012-11-29 DIAGNOSIS — E079 Disorder of thyroid, unspecified: Secondary | ICD-10-CM | POA: Insufficient documentation

## 2012-11-29 DIAGNOSIS — R209 Unspecified disturbances of skin sensation: Secondary | ICD-10-CM | POA: Insufficient documentation

## 2012-11-29 DIAGNOSIS — I129 Hypertensive chronic kidney disease with stage 1 through stage 4 chronic kidney disease, or unspecified chronic kidney disease: Secondary | ICD-10-CM | POA: Insufficient documentation

## 2012-11-29 DIAGNOSIS — F3289 Other specified depressive episodes: Secondary | ICD-10-CM | POA: Insufficient documentation

## 2012-11-29 DIAGNOSIS — R109 Unspecified abdominal pain: Secondary | ICD-10-CM | POA: Insufficient documentation

## 2012-11-29 DIAGNOSIS — F32A Depression, unspecified: Secondary | ICD-10-CM

## 2012-11-29 DIAGNOSIS — F411 Generalized anxiety disorder: Secondary | ICD-10-CM | POA: Insufficient documentation

## 2012-11-29 LAB — CBC WITH DIFFERENTIAL/PLATELET
Basophils Absolute: 0 10*3/uL (ref 0.0–0.1)
Basophils Relative: 0 % (ref 0–1)
Eosinophils Absolute: 0.1 10*3/uL (ref 0.0–0.7)
Eosinophils Relative: 1 % (ref 0–5)
HCT: 37.8 % — ABNORMAL LOW (ref 39.0–52.0)
Hemoglobin: 12.4 g/dL — ABNORMAL LOW (ref 13.0–17.0)
Lymphocytes Relative: 33 % (ref 12–46)
Lymphs Abs: 1.8 10*3/uL (ref 0.7–4.0)
MCH: 27.8 pg (ref 26.0–34.0)
MCHC: 32.8 g/dL (ref 30.0–36.0)
MCV: 84.8 fL (ref 78.0–100.0)
Monocytes Absolute: 0.3 10*3/uL (ref 0.1–1.0)
Monocytes Relative: 6 % (ref 3–12)
Neutro Abs: 3.3 10*3/uL (ref 1.7–7.7)
Neutrophils Relative %: 60 % (ref 43–77)
Platelets: 151 10*3/uL (ref 150–400)
RBC: 4.46 MIL/uL (ref 4.22–5.81)
RDW: 14.6 % (ref 11.5–15.5)
WBC: 5.6 10*3/uL (ref 4.0–10.5)

## 2012-11-29 LAB — URINALYSIS, ROUTINE W REFLEX MICROSCOPIC
Bilirubin Urine: NEGATIVE
Glucose, UA: NEGATIVE mg/dL
Hgb urine dipstick: NEGATIVE
Ketones, ur: NEGATIVE mg/dL
Leukocytes, UA: NEGATIVE
Nitrite: NEGATIVE
Protein, ur: NEGATIVE mg/dL
Specific Gravity, Urine: 1.007 (ref 1.005–1.030)
Urobilinogen, UA: 0.2 mg/dL (ref 0.0–1.0)
pH: 6 (ref 5.0–8.0)

## 2012-11-29 LAB — COMPREHENSIVE METABOLIC PANEL
ALT: 26 U/L (ref 0–53)
AST: 25 U/L (ref 0–37)
Albumin: 4.1 g/dL (ref 3.5–5.2)
Alkaline Phosphatase: 72 U/L (ref 39–117)
BUN: 17 mg/dL (ref 6–23)
CO2: 24 mEq/L (ref 19–32)
Calcium: 9.2 mg/dL (ref 8.4–10.5)
Chloride: 99 mEq/L (ref 96–112)
Creatinine, Ser: 1.31 mg/dL (ref 0.50–1.35)
GFR calc Af Amer: 70 mL/min — ABNORMAL LOW (ref >90)
GFR calc non Af Amer: 60 mL/min — ABNORMAL LOW (ref >90)
Glucose, Bld: 111 mg/dL — ABNORMAL HIGH (ref 70–99)
Potassium: 3.6 mEq/L (ref 3.5–5.1)
Sodium: 137 mEq/L (ref 135–145)
Total Bilirubin: 0.3 mg/dL (ref 0.3–1.2)
Total Protein: 7.2 g/dL (ref 6.0–8.3)

## 2012-11-29 LAB — PRO B NATRIURETIC PEPTIDE: Pro B Natriuretic peptide (BNP): 31.2 pg/mL (ref 0–125)

## 2012-11-29 LAB — SALICYLATE LEVEL: Salicylate Lvl: <2 mg/dL — ABNORMAL LOW (ref 2.8–20.0)

## 2012-11-29 LAB — RAPID URINE DRUG SCREEN, HOSP PERFORMED
Amphetamines: NOT DETECTED
Barbiturates: NOT DETECTED
Benzodiazepines: NOT DETECTED
Cocaine: NOT DETECTED
Opiates: NOT DETECTED
Tetrahydrocannabinol: NOT DETECTED

## 2012-11-29 LAB — LIPASE, BLOOD: Lipase: 37 U/L (ref 11–59)

## 2012-11-29 LAB — POCT I-STAT TROPONIN I: Troponin i, poc: 0 ng/mL (ref 0.00–0.08)

## 2012-11-29 LAB — ACETAMINOPHEN LEVEL
Acetaminophen (Tylenol), Serum: 28.6 ug/mL (ref 10–30)
Acetaminophen (Tylenol), Serum: <15 ug/mL (ref 10–30)

## 2012-11-29 LAB — ETHANOL: Alcohol, Ethyl (B): <11 mg/dL (ref 0–11)

## 2012-11-29 MED ORDER — LORAZEPAM 1 MG PO TABS: 1.0000 mg | ORAL_TABLET | Freq: Three times a day (TID) | ORAL | Status: DC | PRN

## 2012-11-29 MED ORDER — IBUPROFEN 400 MG PO TABS: 600.0000 mg | ORAL_TABLET | Freq: Three times a day (TID) | ORAL | Status: DC | PRN

## 2012-11-29 MED ORDER — SIMVASTATIN 20 MG PO TABS
20.0000 mg | ORAL_TABLET | Freq: Every evening | ORAL | Status: DC
  Administered 2012-11-29: 20 mg via ORAL
  Filled 2012-11-29: qty 1

## 2012-11-29 MED ORDER — ASPIRIN EC 81 MG PO TBEC
81.0000 mg | DELAYED_RELEASE_TABLET | Freq: Every day | ORAL | Status: DC
  Administered 2012-11-29: 81 mg via ORAL
  Filled 2012-11-29: qty 1

## 2012-11-29 MED ORDER — IRBESARTAN 75 MG PO TABS
75.0000 mg | ORAL_TABLET | Freq: Every day | ORAL | Status: DC
  Administered 2012-11-29: 75 mg via ORAL
  Filled 2012-11-29: qty 1

## 2012-11-29 MED ORDER — FEBUXOSTAT 40 MG PO TABS
40.0000 mg | ORAL_TABLET | Freq: Every day | ORAL | Status: DC
  Administered 2012-11-29: 40 mg via ORAL
  Filled 2012-11-29: qty 1

## 2012-11-29 MED ORDER — NICOTINE 21 MG/24HR TD PT24: 21.0000 mg | MEDICATED_PATCH | Freq: Every day | TRANSDERMAL | Status: DC

## 2012-11-29 MED ORDER — COLCHICINE 0.6 MG PO TABS
0.6000 mg | ORAL_TABLET | Freq: Every day | ORAL | Status: DC
  Administered 2012-11-29: 0.6 mg via ORAL
  Filled 2012-11-29: qty 1

## 2012-11-29 MED ORDER — ONDANSETRON HCL 4 MG PO TABS: 4.0000 mg | ORAL_TABLET | Freq: Three times a day (TID) | ORAL | Status: DC | PRN

## 2012-11-29 MED ORDER — ALUM & MAG HYDROXIDE-SIMETH 200-200-20 MG/5ML PO SUSP: 30.0000 mL | ORAL | Status: DC | PRN

## 2012-11-29 MED ORDER — ZOLPIDEM TARTRATE 5 MG PO TABS: 5.0000 mg | ORAL_TABLET | Freq: Every evening | ORAL | Status: DC | PRN

## 2012-11-29 MED ORDER — PANTOPRAZOLE SODIUM 40 MG PO TBEC
40.0000 mg | DELAYED_RELEASE_TABLET | Freq: Every day | ORAL | Status: DC
  Administered 2012-11-29: 40 mg via ORAL
  Filled 2012-11-29: qty 1

## 2012-11-29 MED ORDER — MONTELUKAST SODIUM 10 MG PO TABS
10.0000 mg | ORAL_TABLET | Freq: Every day | ORAL | Status: DC
  Filled 2012-11-29: qty 1

## 2012-11-29 NOTE — ED Notes (Signed)
[facility] has called back and they plan to do pt psych eval around 3 pm.

## 2012-11-29 NOTE — ED Notes (Signed)
Called act team to set up teleact assessment. Spoke with toyka.. She advises appointment around 1130.

## 2012-11-29 NOTE — ED Provider Notes (Signed)
CSN: [id number]     Arrival date & time 11/29/12  4098 History   First MD Initiated Contact with Patient 11/29/12 0604     Chief Complaint  Patient presents with  . Drug Overdose  . Nausea  . Abdominal Pain  . Chest Pain   (Consider location/radiation/quality/duration/timing/severity/associated sxs/prior Treatment) HPI Comments: 57 year old male with a past medical history of CKD, hypertension, hyperlipidemia, chronic low back pain and GERD presents to the emergency department after an accidental overdose on Tylenol. Patient states he took around 12- 500 mg extra strength Tylenols over the past 6-7 hours. Over the past few days he has had sinus congestion, headaches and a "stomach ache", has been taking Goody powders with only minimal relief, so last night decided to try to take tylenol extra strength instead. He also takes Vicodin for chronic low back pain, he has taken at least 2-3 of these within the past 12 hours. Admits to associated nausea without vomiting. Denies taking Tylenol intentionally, he has no intentions of suicide. He does admit to an intentional overdose with Tylenol about 3-4 years ago. He is also complaining of 3 days of left-sided chest pressure with associated tingling in his left hand. He has had some shortness of breath, worse on exertion. Admits to being under increased stress and feels this is where his chest pain is coming from. Denies any ringing in his ears, vomiting, diarrhea, confusion, blurred vision, lightheadedness, dizziness or depression.  The history is provided by the patient and the EMS personnel.    Past Medical History  Diagnosis Date  . Chronic kidney disease   . Allergy   . Hypertension   . Thyroid disease   . GERD (gastroesophageal reflux disease)   . Hyperlipidemia   . Organic impotence   . Penile cyst   . Low back pain    Past Surgical History  Procedure Laterality Date  . Thyroidectomy Right   . Tonsillectomy     Family History   Problem Relation Age of Onset  . Hypertension Mother   . Diabetes Mother     prediabetes  . Diabetes Father   . Heart disease Father   . Arthritis Father   . Other Father     alzheimer  . Hyperlipidemia Father   . Hypertension Father   . Stroke Maternal Grandmother   . Other Paternal Grandmother   . Diabetes Paternal Grandmother   . Heart disease Paternal Grandfather   . Other Paternal Grandfather     migraine headache   History  Substance Use Topics  . Smoking status: Never Smoker   . Smokeless tobacco: Never Used  . Alcohol Use: No    Review of Systems  Constitutional: Negative for fever, chills and diaphoresis.  HENT: Positive for congestion and sinus pressure. Negative for tinnitus.   Eyes: Negative for visual disturbance.  Respiratory: Positive for shortness of breath. Negative for cough.   Gastrointestinal: Positive for nausea and abdominal pain. Negative for vomiting and diarrhea.  Genitourinary: Negative.   Skin: Negative for rash.  Neurological: Positive for headaches. Negative for dizziness, syncope, weakness and light-headedness.  Psychiatric/Behavioral: Negative for suicidal ideas and confusion.       Positive for increased stress.  All other systems reviewed and are negative.    Allergies  Review of patient's allergies indicates no known allergies.  Home Medications   Current Outpatient Rx  Name  Route  Sig  Dispense  Refill  . amLODipine (NORVASC) 5 MG tablet   Oral  Take 5 mg by mouth daily.         Marland Kitchen aspirin 81 MG tablet   Oral   Take 81 mg by mouth daily.         . Avanafil (STENDRA) 100 MG TABS   Oral   Take 100 mg by mouth once.         . Benzoyl Peroxide (BENZEPRO EX)   Apply externally   Apply topically daily.         . Calcium Carbonate-Vitamin D (OSCAL 500/200 D-3 PO)   Oral   Take 500 mg by mouth daily.         Marland Kitchen esomeprazole (NEXIUM) 40 MG capsule   Oral   Take 40 mg by mouth daily before breakfast.          . febuxostat (ULORIC) 40 MG tablet   Oral   Take 80 mg by mouth daily.         . flunisolide (NASAREL) 29 MCG/ACT (0.025%) nasal spray   Nasal   Place 2 sprays into the nose as needed for rhinitis. Dose is for each nostril.         Marland Kitchen HYDROcodone-acetaminophen (NORCO/VICODIN) 5-325 MG per tablet   Oral   Take 1 tablet by mouth every 6 (six) hours as needed for pain.         . hydrOXYzine (ATARAX/VISTARIL) 25 MG tablet   Oral   Take 25 mg by mouth 3 (three) times daily as needed for itching.         . montelukast (SINGULAIR) 10 MG tablet   Oral   Take 10 mg by mouth at bedtime.         Marland Kitchen olmesartan (BENICAR) 20 MG tablet   Oral   Take 20 mg by mouth daily.         . sildenafil (VIAGRA) 100 MG tablet   Oral   Take 100 mg by mouth daily as needed for erectile dysfunction.         . simvastatin (ZOCOR) 20 MG tablet   Oral   Take 20 mg by mouth every evening.          There were no vitals taken for this visit. Physical Exam  Nursing note and vitals reviewed. Constitutional: He is oriented to person, place, and time. He appears well-developed and well-nourished. No distress.  Obese  HENT:  Head: Normocephalic and atraumatic.  Mouth/Throat: Oropharynx is clear and moist.  Eyes: Conjunctivae and EOM are normal. Pupils are equal, round, and reactive to light.  Neck: Normal range of motion. Neck supple.  Cardiovascular: Normal rate, regular rhythm, normal heart sounds and intact distal pulses.   Pulmonary/Chest: Effort normal and breath sounds normal. No respiratory distress. He has no wheezes. He has no rales. He exhibits no tenderness.  Abdominal: Soft. Normal appearance and bowel sounds are normal. He exhibits no distension and no mass. There is tenderness in the right upper quadrant and epigastric area. There is no rigidity, no rebound and no guarding.  No peritoneal signs.  Musculoskeletal: Normal range of motion. He exhibits no edema.  Neurological: He is  alert and oriented to person, place, and time.  Skin: Skin is warm and dry. He is not diaphoretic.  Psychiatric: His speech is normal and behavior is normal. His mood appears anxious. He exhibits a depressed mood. He expresses no homicidal and no suicidal ideation.    ED Course  Procedures (including critical care time) Labs Review Labs Reviewed -  No data to display Imaging Review No results found.  EKG Interpretation     Ventricular Rate:  72 PR Interval:  197 QRS Duration: 97 QT Interval:  408 QTC Calculation: 447 R Axis:   -11 Text Interpretation:  Sinus rhythm No significant change since last tracing            MDM   1. Cold hands and feet   2. Tylenol overdose, initial encounter   3. Depression     Patient with accidental OD on acetaminophen, denies any intentions despite having history of intentional OD. He is well appearing and in NAD, stable vital signs. Also with abdominal and chest pain, sob. Hx of kidney disease. Will work patient up as OD, abdominal pain and chest pain. Labs pending- cbc, cmp, lipase, etoh, acetaminophen level, salicylate level, bnp, troponin, ua, uds. EKG, CXR pending. Case discussed with my attending Dr. Norlene Campbell who agrees with plan of care. 9:00 AM Acetaminophen level 28.6, nontoxic levels. Labs are normal. Troponin negative. Chest x-ray clear. I feel patient is appropriate for discharge home. Upon discussing results with patient, he states he does not feel safe going home as it is "complicated". I asked if he has intentions of hurting himself, he is hesitant to answer and states he is very stressed and now depressed. Patient will be moved to pod C for psych evaluation.  Trevor Mace, PA-C 11/30/12 [phone]

## 2012-11-29 NOTE — ED Notes (Signed)
Called house coverage for a sitter. Spoke with candy

## 2012-11-29 NOTE — BH Assessment (Signed)
Assessment Note  Thomas Pham is an 57 y.o. male with history of depression presents to [facility]. Pt with complaints of a continuos sinus headache without relief for several days. Says he took 4 tylenol at 10:30am/11am yesterday morning; due to no relief took 4 more tylenol 2hrs later; again no relief took another 4 later that evening. Patient begin to feel dizzy realizing he may have overdosed on Tylenol taking #12 over the course of the day. Therefore he drove himself to the ED for medical clearance.   Pt says adamently says that this was not a intentional suicide attempt. Sts, "I wouldn't kill myself but if I died I wouldn't really care". He has a history of 1 piror suicide attempt in 2011. This suicide attempt involved patient overdosing on 15 Tylenol. He was hospitalized @ [facility] for a few days. Additionally, patient was hospitalized at [facility] in 2007 for suicidal thoughts only. Pt reports increased depression triggered by several stressors he is facing at this time. Today he was suppose to be in court as he is facing 8 charges related to taking $ from others. Patient is worried that he may face jail time if he is unable to pay restitution cost. He is also dealing with multiple medical issues.   Patient denies HI, AVH's, and alcohol/drug use.   Patient appears to be passively suicidal. Denies SI but says he wouldn't care if he died today. He also displays poor judgement/insight evidenced by taking a large amount of Tylenol in the course of a day. He has a previous history of suicide attempt in the same manor that brought him to the ED today. Writer asked patient what would he like see happen today. Patient sts, "I want to go to [facility] for a few days because I have so many stressors on my mind". Writer informed patient that his case would be discussed with a midlevel of psychiatrist for disposition recommendations. Writer also explained the process further; that if inpatient is recommended, treatment would be  sought at [facility]. However, if no beds at [facility] referrals would be made to find him another facility. Patient at this time sts, "I am not suicidal and I want to go home". Pt sts he will refuse to go to any other facility other than [facility]. If he has to be transferred to another facility he will refuse treatment. Writer will request an additional evaluation by Verne Spurr for further recommendations d/c vs. Inpatient.     Axis I: Major Depression, Recurrent severe without psychotic features Axis II: Deferred Axis III:  Past Medical History  Diagnosis Date  . Chronic kidney disease   . Allergy   . Hypertension   . Thyroid disease   . GERD (gastroesophageal reflux disease)   . Hyperlipidemia   . Organic impotence   . Penile cyst   . Low back pain    Axis IV: other psychosocial or environmental problems, problems related to legal system/crime, problems related to social environment, problems with access to health care services and problems with primary support group Axis V: 31-40 impairment in reality testing  Past Medical History:  Past Medical History  Diagnosis Date  . Chronic kidney disease   . Allergy   . Hypertension   . Thyroid disease   . GERD (gastroesophageal reflux disease)   . Hyperlipidemia   . Organic impotence   . Penile cyst   . Low back pain     Past Surgical History  Procedure Laterality Date  . Thyroidectomy  Right   . Tonsillectomy      Family History:  Family History  Problem Relation Age of Onset  . Hypertension Mother   . Diabetes Mother     prediabetes  . Diabetes Father   . Heart disease Father   . Arthritis Father   . Other Father     alzheimer  . Hyperlipidemia Father   . Hypertension Father   . Stroke Maternal Grandmother   . Other Paternal Grandmother   . Diabetes Paternal Grandmother   . Heart disease Paternal Grandfather   . Other Paternal Grandfather     migraine headache    Social History:  reports that he has never smoked. He has  never used smokeless tobacco. He reports that he does not drink alcohol or use illicit drugs.  Additional Social History:  Alcohol / Drug Use Pain Medications: SEE MAR Prescriptions: SEE MAR Over the Counter: SEE MAR  CIWA: CIWA-Ar BP: 155/98 mmHg Pulse Rate: 69 COWS:    Allergies: No Known Allergies  Home Medications:  (Not in a hospital admission)  OB/GYN Status:  No LMP for male patient.  General Assessment Data Location of Assessment: [facility] ED Is this a Tele or Face-to-Face Assessment?: Tele Assessment Is this an Initial Assessment or a Re-assessment for this encounter?: Initial Assessment Living Arrangements: Parent;Other (Comment) (lives with his mother ) Can pt return to current living arrangement?: Yes Admission Status: Voluntary Is patient capable of signing voluntary admission?: Yes Transfer from: Acute Hospital Referral Source: Self/Family/Friend     [facility] Crisis [facility] Plan Living Arrangements: Parent;Other (Comment) (lives with his mother ) Name of Psychiatrist:  (no psychiatrist ) Name of Therapist:  (no therapist)  Education Status Is patient currently in school?: No  Risk to self Suicidal Ideation: Yes-Currently Present (I'm not suicidal but wouldn't care if I died"- passive SI) Suicidal Intent: Yes-Currently Present (questionable; pt OD taking 12 pills, pt however denies) Is patient at risk for suicide?: Yes (potentially due to his history of suicide attempt 2011-OD) Suicidal Plan?:  (pt denies;however took 12 Tylenol in the course of 24 hrs) Access to Means:  (acces to over the counter meds and Rx meds) Previous Attempts/Gestures: Yes How many times?:  (1 prior attempt in 2011; overdosed; took 15 Tylenol) Other Self Harm Risks:  (none reported) Triggers for Past Attempts: Other (Comment) (pt sts that he was depressed) Intentional Self Injurious Behavior: None Recent stressful life event(s): Other (Comment);Legal Issues (legal issues, court today, must pay  restitution, facing jail) Persecutory voices/beliefs?: No Depression: No Depression Symptoms: Feeling angry/irritable;Feeling worthless/self pity;Loss of interest in usual pleasures;Fatigue Substance abuse history and/or treatment for substance abuse?: No Suicide prevention information given to non-admitted patients: Not applicable  Risk to Others Homicidal Ideation: No Thoughts of Harm to Others: No Current Homicidal Intent: No Current Homicidal Plan: No Access to Homicidal Means: No Identified Victim:  (no victim reported) History of harm to others?: No Assessment of Violence: None Noted Violent Behavior Description:  (patient is calm and cooperative) Does patient have access to weapons?: No Criminal Charges Pending?: Yes (convicted of mult. crimes-8 charges includ. felonies) Describe Pending Criminal Charges:  (crimes related to taking people's money) Does patient have a court date: Yes Court Date:  (today 11/29/2012)  Psychosis Hallucinations: None noted Delusions: None noted  Mental Status Report Appear/Hygiene: Disheveled Eye Contact: Good Motor Activity: Freedom of movement Speech: Logical/coherent Level of Consciousness: Alert;Crying Mood: Depressed Affect: Appropriate to circumstance Anxiety Level: None Thought Processes: Coherent Judgement: Impaired Orientation:  Person;Place;Time;Situation Obsessive Compulsive Thoughts/Behaviors: None  Cognitive Functioning Concentration: Decreased Memory: Recent Intact;Remote Intact IQ: Average Insight: Fair Impulse Control: Fair Appetite: Good Weight Loss:  (noen reported) Weight Gain:  (none reported) Sleep: Decreased Total Hours of Sleep:  ("I wake up at 4 or 5am and cn't get back to sleep") Vegetative Symptoms: None  ADLScreening [facility] Assessment Services) Patient's cognitive ability adequate to safely complete daily activities?: Yes Patient able to express need for assistance with ADLs?: Yes Independently  performs ADLs?: Yes (appropriate for developmental age)  Prior Inpatient Therapy Prior Inpatient Therapy: Yes Prior Therapy Dates:  (2007 and 2001 @ [facility]) Prior Therapy Facilty/Provider(s):  [facility]) Reason for Treatment:  (2007--sucidal thoughts; 2011--suicide attempt by OD)  Prior Outpatient Therapy Prior Outpatient Therapy: No Prior Therapy Dates:  (n/a) Prior Therapy Facilty/Provider(s):  (n/a) Reason for Treatment:  (n/a)  ADL Screening (condition at time of admission) Patient's cognitive ability adequate to safely complete daily activities?: Yes Is the patient deaf or have difficulty hearing?: No Does the patient have difficulty seeing, even when wearing glasses/contacts?: No Does the patient have difficulty concentrating, remembering, or making decisions?: No Patient able to express need for assistance with ADLs?: Yes Does the patient have difficulty dressing or bathing?: No Independently performs ADLs?: Yes (appropriate for developmental age) Communication: Independent Dressing (OT): Independent Grooming: Independent Feeding: Independent Bathing: Independent Toileting: Independent In/Out Bed: Independent Walks in Home: Independent Does the patient have difficulty walking or climbing stairs?: No Weakness of Legs: None Weakness of Arms/Hands: None  Home Assistive Devices/Equipment Home Assistive Devices/Equipment: None    Abuse/Neglect Assessment (Assessment to be complete while patient is alone) Physical Abuse: Denies Verbal Abuse: Denies Sexual Abuse: Denies Exploitation of patient/patient's resources: Denies Self-Neglect: Denies Values / Beliefs Cultural Requests During Hospitalization: None Spiritual Requests During Hospitalization: None   Advance Directives (For Healthcare) Advance Directive: Patient does not have advance directive Nutrition Screen- [facility] Patient's home diet: Regular  Additional Information 1:1 In Past 12 Months?: No CIRT Risk:  No Elopement Risk: No Does patient have medical clearance?: Yes     Disposition:  Disposition Initial Assessment Completed for this Encounter: Yes Disposition of Patient: Other dispositions (Pending evaluation by psychiatry and/or mid level)  On Site Evaluation by:   Reviewed with Physician:    Octaviano Batty 11/29/2012 12:45 PM

## 2012-11-29 NOTE — ED Notes (Signed)
PT DAILY MEDS ORDERED FROM PHARMACY

## 2012-11-29 NOTE — ED Notes (Signed)
LAB AT BEDSIDE TO DRAW TYLENOL LEVEL

## 2012-11-29 NOTE — ED Notes (Signed)
PATIENT HAS VERBALIZED HE IS UNDER A LOT OF STRESS. HE STATES HE HAS PRESSING LEGAL ISSUES AND IS MISSING SUPERIOR COURT DATE TODAY. WANTS REASSURANCE THAT HE WILL HAVE SOME SORT OF DOCUMENTATION THAT HE WAS HERE AND BEING CARED FOR.

## 2012-11-29 NOTE — ED Notes (Signed)
IV started, blood drawn, attempting to use urinal, pt to xray. Alert, NAD, calm. Rates pain, abd & HA, 5/10.

## 2012-11-29 NOTE — ED Provider Notes (Signed)
5:37 PM Pt evaluated by TSS, discussed w/ Dr. Lucianne Muss, who does not feel pt requires inpt treatment. He will f/u as outpt w/ Dr. Lolly Mustache and therapist, Dr. Ophelia Charter  1. Cold hands and feet   2. Tylenol overdose, initial encounter   3. Depression      Shanna Cisco, MD 11/29/12 [phone]

## 2012-11-29 NOTE — Consult Note (Signed)
Telepsych Consultation   Reason for Consult:  Depression, possible SI Referring Physician:   YOSHIO SELIGA is an 57 y.o. male.  Assessment: AXIS I:  Mood Disorder NOS AXIS II:  Deferred AXIS III:   Past Medical History  Diagnosis Date  . Chronic kidney disease   . Allergy   . Hypertension   . Thyroid disease   . GERD (gastroesophageal reflux disease)   . Hyperlipidemia   . Organic impotence   . Penile cyst   . Low back pain    AXIS IV:  economic problems, occupational problems, other psychosocial or environmental problems and problems related to legal system/crime AXIS V:  51-60 moderate symptoms  Plan:  No evidence of imminent risk to self or others at present.   Patient does not meet criteria for psychiatric inpatient admission. Supportive therapy provided about ongoing stressors.  Subjective:   IVIE MAESE is a 57 y.o. male patient admitted with .  HPI:  JONCARLO FRIBERG is an 57 y.o. male with history of depression presents to [facility]. Pt with complaints of a continuos sinus headache without relief for several days. Says he took 4 tylenol at 10:30am/11am yesterday morning; due to no relief took 4 more tylenol 2hrs later; again no relief took another 4 later that evening. Patient begin to feel dizzy realizing he may have overdosed on Tylenol taking #12 over the course of the day. Therefore he drove himself to the ED for medical clearance. Patient states "This is not like when I overdosed last time on tylenol which was intentional. I was trying to self treat a headache. Yes I have been stressed but I can deal with it. I live with my mother and have her support. I will not hurt myself if I leave here under any circumstances. I would like to deal with my stress in a healthy manner."   HPI Elements:   Location:  [facility]. Quality:  Medical problems with possible tylenol overdose. Severity:  Patient appears stable. . Timing:  Over the last week. . Duration:  Stress for many  months. Context:  Medical and legal problems. .  Past Psychiatric History: Past Medical History  Diagnosis Date  . Chronic kidney disease   . Allergy   . Hypertension   . Thyroid disease   . GERD (gastroesophageal reflux disease)   . Hyperlipidemia   . Organic impotence   . Penile cyst   . Low back pain     reports that he has never smoked. He has never used smokeless tobacco. He reports that he does not drink alcohol or use illicit drugs. Family History  Problem Relation Age of Onset  . Hypertension Mother   . Diabetes Mother     prediabetes  . Diabetes Father   . Heart disease Father   . Arthritis Father   . Other Father     alzheimer  . Hyperlipidemia Father   . Hypertension Father   . Stroke Maternal Grandmother   . Other Paternal Grandmother   . Diabetes Paternal Grandmother   . Heart disease Paternal Grandfather   . Other Paternal Grandfather     migraine headache   Family History Substance Abuse: No Family Supports: Yes, List: (feels mother is somewhat supportive ) Living Arrangements: Parent;Other (Comment) (lives with his mother ) Can pt return to current living arrangement?: Yes Allergies:  No Known Allergies  ACT Assessment Complete:  Yes:    Educational Status    Risk to Self: Risk to self  Suicidal Ideation: Yes-Currently Present (I'm not suicidal but wouldn't care if I died"- passive SI) Suicidal Intent: Yes-Currently Present (questionable; pt OD taking 12 pills, pt however denies) Is patient at risk for suicide?: Yes (potentially due to his history of suicide attempt 2011-OD) Suicidal Plan?:  (pt denies;however took 12 Tylenol in the course of 24 hrs) Access to Means:  (acces to over the counter meds and Rx meds) Previous Attempts/Gestures: Yes How many times?:  (1 prior attempt in 2011; overdosed; took 15 Tylenol) Other Self Harm Risks:  (none reported) Triggers for Past Attempts: Other (Comment) (pt sts that he was depressed) Intentional Self  Injurious Behavior: None Recent stressful life event(s): Other (Comment);Legal Issues (legal issues, court today, must pay restitution, facing jail) Persecutory voices/beliefs?: No Depression: No Depression Symptoms: Feeling angry/irritable;Feeling worthless/self pity;Loss of interest in usual pleasures;Fatigue Substance abuse history and/or treatment for substance abuse?: No Suicide prevention information given to non-admitted patients: Not applicable  Risk to Others: Risk to Others Homicidal Ideation: No Thoughts of Harm to Others: No Current Homicidal Intent: No Current Homicidal Plan: No Access to Homicidal Means: No Identified Victim:  (no victim reported) History of harm to others?: No Assessment of Violence: None Noted Violent Behavior Description:  (patient is calm and cooperative) Does patient have access to weapons?: No Criminal Charges Pending?: Yes (convicted of mult. crimes-8 charges includ. felonies) Describe Pending Criminal Charges:  (crimes related to taking people's money) Does patient have a court date: Yes Court Date:  (today 11/29/2012)  Abuse: Abuse/Neglect Assessment (Assessment to be complete while patient is alone) Physical Abuse: Denies Verbal Abuse: Denies Sexual Abuse: Denies Exploitation of patient/patient's resources: Denies Self-Neglect: Denies  Prior Inpatient Therapy: Prior Inpatient Therapy Prior Inpatient Therapy: Yes Prior Therapy Dates:  (2007 and 2001 @ [facility]) Prior Therapy Facilty/Provider(s):  [facility]) Reason for Treatment:  (2007--sucidal thoughts; 2011--suicide attempt by OD)  Prior Outpatient Therapy: Prior Outpatient Therapy Prior Outpatient Therapy: No Prior Therapy Dates:  (n/a) Prior Therapy Facilty/Provider(s):  (n/a) Reason for Treatment:  (n/a)  Additional Information: Additional Information 1:1 In Past 12 Months?: No CIRT Risk: No Elopement Risk: No Does patient have medical clearance?: Yes                   Objective: Blood pressure 157/88, pulse 74, temperature 98.3 F (36.8 C), temperature source Oral, resp. rate 12, SpO2 99.00%.There is no weight on file to calculate BMI. Results for orders placed during the hospital encounter of 11/29/12 (from the past 72 hour(s))  COMPREHENSIVE METABOLIC PANEL     Status: Abnormal   Collection Time    11/29/12  6:30 AM      Result Value Range   Sodium 137  135 - 145 mEq/L   Potassium 3.6  3.5 - 5.1 mEq/L   Chloride 99  96 - 112 mEq/L   CO2 24  19 - 32 mEq/L   Glucose, Bld 111 (*) 70 - 99 mg/dL   BUN 17  6 - 23 mg/dL   Creatinine, Ser 8.11  0.50 - 1.35 mg/dL   Calcium 9.2  8.4 - 91.4 mg/dL   Total Protein 7.2  6.0 - 8.3 g/dL   Albumin 4.1  3.5 - 5.2 g/dL   AST 25  0 - 37 U/L   ALT 26  0 - 53 U/L   Alkaline Phosphatase 72  39 - 117 U/L   Total Bilirubin 0.3  0.3 - 1.2 mg/dL   GFR calc non Af Amer 60 (*) >90  mL/min   GFR calc Af Amer 70 (*) >90 mL/min   Comment: (NOTE)     The eGFR has been calculated using the CKD EPI equation.     This calculation has not been validated in all clinical situations.     eGFR's persistently <90 mL/min signify possible Chronic Kidney     Disease.  CBC WITH DIFFERENTIAL     Status: Abnormal   Collection Time    11/29/12  6:30 AM      Result Value Range   WBC 5.6  4.0 - 10.5 K/uL   RBC 4.46  4.22 - 5.81 MIL/uL   Hemoglobin 12.4 (*) 13.0 - 17.0 g/dL   HCT 96.0 (*) 45.4 - 09.8 %   MCV 84.8  78.0 - 100.0 fL   MCH 27.8  26.0 - 34.0 pg   MCHC 32.8  30.0 - 36.0 g/dL   RDW 11.9  14.7 - 82.9 %   Platelets 151  150 - 400 K/uL   Neutrophils Relative % 60  43 - 77 %   Neutro Abs 3.3  1.7 - 7.7 K/uL   Lymphocytes Relative 33  12 - 46 %   Lymphs Abs 1.8  0.7 - 4.0 K/uL   Monocytes Relative 6  3 - 12 %   Monocytes Absolute 0.3  0.1 - 1.0 K/uL   Eosinophils Relative 1  0 - 5 %   Eosinophils Absolute 0.1  0.0 - 0.7 K/uL   Basophils Relative 0  0 - 1 %   Basophils Absolute 0.0  0.0 - 0.1  K/uL  ETHANOL     Status: None   Collection Time    11/29/12  6:30 AM      Result Value Range   Alcohol, Ethyl (B) <11  0 - 11 mg/dL   Comment:            LOWEST DETECTABLE LIMIT FOR     SERUM ALCOHOL IS 11 mg/dL     FOR MEDICAL PURPOSES ONLY  PRO B NATRIURETIC PEPTIDE     Status: None   Collection Time    11/29/12  6:30 AM      Result Value Range   Pro B Natriuretic peptide (BNP) 31.2  0 - 125 pg/mL  SALICYLATE LEVEL     Status: Abnormal   Collection Time    11/29/12  6:30 AM      Result Value Range   Salicylate Lvl <2.0 (*) 2.8 - 20.0 mg/dL  ACETAMINOPHEN LEVEL     Status: None   Collection Time    11/29/12  6:30 AM      Result Value Range   Acetaminophen (Tylenol), Serum 28.6  10 - 30 ug/mL   Comment:            THERAPEUTIC CONCENTRATIONS VARY     SIGNIFICANTLY. A RANGE OF 10-30     ug/mL MAY BE AN EFFECTIVE     CONCENTRATION FOR MANY PATIENTS.     HOWEVER, SOME ARE BEST TREATED     AT CONCENTRATIONS OUTSIDE THIS     RANGE.     ACETAMINOPHEN CONCENTRATIONS     >150 ug/mL AT 4 HOURS AFTER     INGESTION AND >50 ug/mL AT 12     HOURS AFTER INGESTION ARE     OFTEN ASSOCIATED WITH TOXIC     REACTIONS.  LIPASE, BLOOD     Status: None   Collection Time    11/29/12  6:30 AM  Result Value Range   Lipase 37  11 - 59 U/L  POCT I-STAT TROPONIN I     Status: None   Collection Time    11/29/12  6:45 AM      Result Value Range   Troponin i, poc 0.00  0.00 - 0.08 ng/mL   Comment 3            Comment: Due to the release kinetics of cTnI,     a negative result within the first hours     of the onset of symptoms does not rule out     myocardial infarction with certainty.     If myocardial infarction is still suspected,     repeat the test at appropriate intervals.  URINE RAPID DRUG SCREEN (HOSP PERFORMED)     Status: None   Collection Time    11/29/12  7:48 AM      Result Value Range   Opiates NONE DETECTED  NONE DETECTED   Cocaine NONE DETECTED  NONE DETECTED    Benzodiazepines NONE DETECTED  NONE DETECTED   Amphetamines NONE DETECTED  NONE DETECTED   Tetrahydrocannabinol NONE DETECTED  NONE DETECTED   Barbiturates NONE DETECTED  NONE DETECTED   Comment:            DRUG SCREEN FOR MEDICAL PURPOSES     ONLY.  IF CONFIRMATION IS NEEDED     FOR ANY PURPOSE, NOTIFY LAB     WITHIN 5 DAYS.                LOWEST DETECTABLE LIMITS     FOR URINE DRUG SCREEN     Drug Class       Cutoff (ng/mL)     Amphetamine      1000     Barbiturate      200     Benzodiazepine   200     Tricyclics       300     Opiates          300     Cocaine          300     THC              50  URINALYSIS, ROUTINE W REFLEX MICROSCOPIC     Status: None   Collection Time    11/29/12  7:48 AM      Result Value Range   Color, Urine YELLOW  YELLOW   APPearance CLEAR  CLEAR   Specific Gravity, Urine 1.007  1.005 - 1.030   pH 6.0  5.0 - 8.0   Glucose, UA NEGATIVE  NEGATIVE mg/dL   Hgb urine dipstick NEGATIVE  NEGATIVE   Bilirubin Urine NEGATIVE  NEGATIVE   Ketones, ur NEGATIVE  NEGATIVE mg/dL   Protein, ur NEGATIVE  NEGATIVE mg/dL   Urobilinogen, UA 0.2  0.0 - 1.0 mg/dL   Nitrite NEGATIVE  NEGATIVE   Leukocytes, UA NEGATIVE  NEGATIVE   Comment: MICROSCOPIC NOT DONE ON URINES WITH NEGATIVE PROTEIN, BLOOD, LEUKOCYTES, NITRITE, OR GLUCOSE <1000 mg/dL.  ACETAMINOPHEN LEVEL     Status: None   Collection Time    11/29/12 10:24 AM      Result Value Range   Acetaminophen (Tylenol), Serum <15.0  10 - 30 ug/mL   Comment:            THERAPEUTIC CONCENTRATIONS VARY     SIGNIFICANTLY. A RANGE OF 10-30     ug/mL MAY BE AN EFFECTIVE  CONCENTRATION FOR MANY PATIENTS.     HOWEVER, SOME ARE BEST TREATED     AT CONCENTRATIONS OUTSIDE THIS     RANGE.     ACETAMINOPHEN CONCENTRATIONS     >150 ug/mL AT 4 HOURS AFTER     INGESTION AND >50 ug/mL AT 12     HOURS AFTER INGESTION ARE     OFTEN ASSOCIATED WITH TOXIC     REACTIONS.   Labs are reviewed and are pertinent for WNL  .  Current Facility-Administered Medications  Medication Dose Route Frequency Provider Last Rate Last Dose  . alum & mag hydroxide-simeth (MAALOX/MYLANTA) 200-200-20 MG/5ML suspension 30 mL  30 mL Oral PRN Trevor Mace, PA-C      . aspirin EC tablet 81 mg  81 mg Oral Daily Gerhard Munch, MD   81 mg at 11/29/12 1040  . colchicine tablet 0.6 mg  0.6 mg Oral Daily Gerhard Munch, MD   0.6 mg at 11/29/12 1040  . febuxostat (ULORIC) tablet 40 mg  40 mg Oral Daily Gerhard Munch, MD   40 mg at 11/29/12 1040  . ibuprofen (ADVIL,MOTRIN) tablet 600 mg  600 mg Oral Q8H PRN Trevor Mace, PA-C      . irbesartan (AVAPRO) tablet 75 mg  75 mg Oral Daily Gerhard Munch, MD   75 mg at 11/29/12 1040  . LORazepam (ATIVAN) tablet 1 mg  1 mg Oral Q8H PRN Trevor Mace, PA-C      . montelukast (SINGULAIR) tablet 10 mg  10 mg Oral QHS Gerhard Munch, MD      . ondansetron [facility]) tablet 4 mg  4 mg Oral Q8H PRN Trevor Mace, PA-C      . pantoprazole (PROTONIX) EC tablet 40 mg  40 mg Oral Daily Gerhard Munch, MD   40 mg at 11/29/12 1040  . simvastatin (ZOCOR) tablet 20 mg  20 mg Oral QPM Gerhard Munch, MD      . zolpidem [facility]) tablet 5 mg  5 mg Oral QHS PRN Trevor Mace, PA-C       Current Outpatient Prescriptions  Medication Sig Dispense Refill  . acetaminophen (TYLENOL) 500 MG tablet Take 2,000 mg by mouth every 4 (four) hours as needed for pain.      Marland Kitchen aspirin EC 81 MG tablet Take 81 mg by mouth daily.      . Aspirin-Acetaminophen-Caffeine (GOODY HEADACHE PO) Take 1 packet by mouth every 6 (six) hours as needed (for pain).      . Benzoyl Peroxide (BENZEPRO EX) Apply topically daily.      . Calcium Carbonate-Vitamin D (OSCAL 500/200 D-3 PO) Take 500 mg by mouth 3 (three) times daily.       . colchicine 0.6 MG tablet Take 0.6 mg by mouth daily.      Marland Kitchen esomeprazole (NEXIUM) 40 MG capsule Take 40 mg by mouth daily before breakfast.      . febuxostat (ULORIC) 40 MG tablet Take 40 mg by mouth  daily.       . flunisolide (NASAREL) 29 MCG/ACT (0.025%) nasal spray Place 2 sprays into the nose as needed for rhinitis. Dose is for each nostril.      . halobetasol (ULTRAVATE) 0.05 % cream Apply topically daily.      Marland Kitchen HYDROcodone-acetaminophen (NORCO/VICODIN) 5-325 MG per tablet Take 1 tablet by mouth every 6 (six) hours as needed for pain.      . montelukast (SINGULAIR) 10 MG tablet Take 10 mg by mouth at  bedtime.      Marland Kitchen olmesartan (BENICAR) 20 MG tablet Take 20 mg by mouth daily.      . sildenafil (VIAGRA) 100 MG tablet Take 100 mg by mouth daily as needed for erectile dysfunction.      . simvastatin (ZOCOR) 20 MG tablet Take 20 mg by mouth every evening.      . Vitamin D, Ergocalciferol, (DRISDOL) 50000 UNITS CAPS capsule Take 50,000 Units by mouth every 7 (seven) days. On tuesdays        Psychiatric Specialty Exam:     Blood pressure 157/88, pulse 74, temperature 98.3 F (36.8 C), temperature source Oral, resp. rate 12, SpO2 99.00%.There is no weight on file to calculate BMI.  General Appearance: Casual  Eye Contact::  Good  Speech:  Clear and Coherent  Volume:  Normal  Mood:  Euthymic  Affect:  Full Range  Thought Process:  Goal Directed and Intact  Orientation:  Full (Time, Place, and Person)  Thought Content:  WDL  Suicidal Thoughts:  No  Homicidal Thoughts:  No  Memory:  Immediate;   Good Recent;   Good Remote;   Good  Judgement:  Fair  Insight:  Present  Psychomotor Activity:  Normal  Concentration:  Good  Recall:  Good  Akathisia:  No  Handed:  Right  AIMS (if indicated):     Assets:  Communication Skills Desire for Improvement Intimacy Leisure Time Physical Health Resilience  Sleep:      Treatment Plan Summary: Discussed case with Dr. Lucianne Muss. The patient does not appear to be a danger to himself or anyone else. Patient does not have symptoms that would warrant an inpatient admission. He is open to being set up with an outpatient provider. Appointments have  been set up for patient with Dr. Lolly Mustache at [facility] for 12/01/12 at 11 am. Also set up with a therapist Forde Radon for appointment on 12/07/12 11am. Patient is safe to be discharged home with his follow up in place.   Disposition: Disposition Initial Assessment Completed for this Encounter: Yes Disposition of Patient: Other dispositions (Pending evaluation by psychiatry and/or mid level)  Rinnah Peppel NP-C 11/29/2012 3:33 PM

## 2012-11-29 NOTE — ED Notes (Signed)
Pt denies depression, SI, HI. States has been under a lot of stress, "but I am not suicidal".

## 2012-11-29 NOTE — ED Notes (Signed)
telepsych in progress 

## 2012-11-29 NOTE — ED Notes (Addendum)
Report received, assumed care.  

## 2012-11-29 NOTE — ED Notes (Signed)
ED PA at bedside

## 2012-11-29 NOTE — ED Notes (Signed)
telepsych completed 

## 2012-11-29 NOTE — ED Notes (Addendum)
PSYCH EVAL HAS BEEN COMPLETED. AWAITING TO HEAR FROM PROVIDER AS TO PT DISPOSITION. PT SPOKE WITH LAURA

## 2012-11-29 NOTE — ED Notes (Addendum)
Here for accidental OD on Tylenol. Thinks he took 12-500mg  tylenols over the last 6-7 hrs, also mentions some vicodin and goodies within the same timeframe. Admits intentional OD 3-4 yrs ago, but this was not intentional. Admits to lots of stress. Reports recent abd discomfort. Was taking pain meds for this reason. Goodies was not helping, so took tylenol. abd pain and nausea for 3-4days. Also reports recent chest pressure, some sob and tingling in L hand, onset this am, thinks it might be related to stress. Here for concern for OD. (Denies: vd, fever, visual changes, ringing in ears or other sx). Alert, NAD, calm, interactive, resps e/u, speaking in clear complete sentences. Last took 4-500mg  tylenol 3 hrs ago.

## 2012-11-29 NOTE — ED Notes (Signed)
ED PA at BS 

## 2012-11-29 NOTE — BH Assessment (Signed)
Writer will complete a telepsych on this patient approx. [phone]. Writer called examining providing Zella Ball for clinicals before seeing patient.

## 2012-11-29 NOTE — ED Notes (Signed)
PATIENT HAS AMBULATED TO POD C. SITTER IS AT BEDSIDE.

## 2012-11-29 NOTE — ED Notes (Signed)
Lab back to draw sample. Per lab tech the main lab has lost sample he drew earlier

## 2012-11-29 NOTE — BH Assessment (Signed)
Beth at OV - pt has been declined due to lack of acuity.  Evette Cristal, [place] Assessment Counselor

## 2012-11-29 NOTE — ED Notes (Signed)
PT NOW WAITING TO SPEAK WITH PSYCH AROUND 1330

## 2012-12-01 ENCOUNTER — Ambulatory Visit (HOSPITAL_COMMUNITY): Payer: BC Managed Care – PPO | Admitting: Psychiatry

## 2012-12-01 NOTE — ED Provider Notes (Signed)
Medical screening examination/treatment/procedure(s) were performed by non-physician practitioner and as supervising physician I was immediately available for consultation/collaboration.  Jamonica Schoff M Kallie Depolo, MD 12/01/12 0932 

## 2012-12-01 NOTE — Consult Note (Signed)
Case discussed and plan formulated

## 2012-12-07 ENCOUNTER — Ambulatory Visit (HOSPITAL_COMMUNITY): Payer: BC Managed Care – PPO | Admitting: Psychology

## 2013-01-02 ENCOUNTER — Ambulatory Visit (HOSPITAL_COMMUNITY): Payer: BC Managed Care – PPO | Admitting: Psychiatry

## 2013-10-17 ENCOUNTER — Other Ambulatory Visit: Payer: Self-pay | Admitting: Orthopedic Surgery

## 2013-10-17 NOTE — Progress Notes (Signed)
Preoperative surgical orders have been place into the Epic hospital system for Clayborne Artist on 10/17/2013, 10:05 AM  by Mickel Crow for surgery on 11/08/2013.  Preop Knee Scope orders including IV Tylenol and IV Decadron as long as there are no contraindications to the above medications. Arlee Muslim, PA-C

## 2013-10-30 ENCOUNTER — Encounter (HOSPITAL_COMMUNITY): Payer: Self-pay | Admitting: Pharmacy Technician

## 2013-11-01 ENCOUNTER — Other Ambulatory Visit (HOSPITAL_COMMUNITY): Payer: Self-pay | Admitting: *Deleted

## 2013-11-02 ENCOUNTER — Encounter (INDEPENDENT_AMBULATORY_CARE_PROVIDER_SITE_OTHER): Payer: Self-pay

## 2013-11-02 ENCOUNTER — Encounter (HOSPITAL_COMMUNITY): Payer: Self-pay

## 2013-11-02 ENCOUNTER — Encounter (HOSPITAL_COMMUNITY)
Admission: RE | Admit: 2013-11-02 | Discharge: 2013-11-02 | Disposition: A | Discharge status: Home / Self Care | Payer: BC Managed Care – PPO | Source: Ambulatory Visit | Attending: Orthopedic Surgery | Admitting: Orthopedic Surgery

## 2013-11-02 DIAGNOSIS — Z01812 Encounter for preprocedural laboratory examination: Secondary | ICD-10-CM | POA: Diagnosis present

## 2013-11-02 DIAGNOSIS — M23305 Other meniscus derangements, unspecified medial meniscus, unspecified knee: Secondary | ICD-10-CM | POA: Diagnosis present

## 2013-11-02 HISTORY — DX: Sleep disorder, unspecified: G47.9

## 2013-11-02 HISTORY — DX: Personal history of urinary calculi: Z87.442

## 2013-11-02 HISTORY — DX: Personal history of other diseases of the musculoskeletal system and connective tissue: Z87.39

## 2013-11-02 HISTORY — DX: Unspecified osteoarthritis, unspecified site: M19.90

## 2013-11-02 HISTORY — DX: Other tear of medial meniscus, current injury, unspecified knee, initial encounter: S83.249A

## 2013-11-02 LAB — CBC
HCT: 43.7 % (ref 39.0–52.0)
Hemoglobin: 14.6 g/dL (ref 13.0–17.0)
MCH: 28.2 pg (ref 26.0–34.0)
MCHC: 33.4 g/dL (ref 30.0–36.0)
MCV: 84.4 fL (ref 78.0–100.0)
Platelets: 142 10*3/uL — ABNORMAL LOW (ref 150–400)
RBC: 5.18 MIL/uL (ref 4.22–5.81)
RDW: 14.2 % (ref 11.5–15.5)
WBC: 6.1 10*3/uL (ref 4.0–10.5)

## 2013-11-02 LAB — BASIC METABOLIC PANEL
Anion gap: 13 (ref 5–15)
BUN: 20 mg/dL (ref 6–23)
CO2: 26 mEq/L (ref 19–32)
Calcium: 9.4 mg/dL (ref 8.4–10.5)
Chloride: 99 mEq/L (ref 96–112)
Creatinine, Ser: 1.26 mg/dL (ref 0.50–1.35)
GFR calc Af Amer: 73 mL/min — ABNORMAL LOW (ref >90)
GFR calc non Af Amer: 63 mL/min — ABNORMAL LOW (ref >90)
Glucose, Bld: 146 mg/dL — ABNORMAL HIGH (ref 70–99)
Potassium: 4.1 mEq/L (ref 3.7–5.3)
Sodium: 138 mEq/L (ref 137–147)

## 2013-11-02 NOTE — Progress Notes (Signed)
11/02/13 0900  OBSTRUCTIVE SLEEP APNEA  Have you ever been diagnosed with sleep apnea through a sleep study? No  Do you snore loudly (loud enough to be heard through closed doors)?  1  Do you often feel tired, fatigued, or sleepy during the daytime? 0  Has anyone observed you stop breathing during your sleep? 0  Do you have, or are you being treated for high blood pressure? 1  BMI more than 35 kg/m2? 0  Age over 56 years old? 1  Neck circumference greater than 40 cm/16 inches? 1  Gender: 1  Obstructive Sleep Apnea Score 5  Score 4 or greater  Results sent to PCP

## 2013-11-02 NOTE — Patient Instructions (Addendum)
Thomas Pham  11/02/2013                           YOUR PROCEDURE IS SCHEDULED ON: 11/08/13               ENTER THRU [facility] MAIN [facility]PITAL ENTRANCE AND                            FOLLOW  SIGNS TO SHORT STAY CENTER                 ARRIVE AT SHORT STAY AT:  7:15 AM               CALL THIS NUMBER IF ANY PROBLEMS THE DAY OF SURGERY :               [phone]                                REMEMBER:   Do not eat food or drink liquids AFTER MIDNIGHT                  Take these medicines the morning of surgery with               A SIPS OF WATER :  COLCHICINE / NEXIUM / ULORIC / SINGULAIR / ZANTAC / ZOCOR / HYDROCODONE IF NEEDED    Do not wear jewelry, make-up   Do not wear lotions, powders, or perfumes.   Do not shave legs or underarms 12 hrs. before surgery (men may shave face)  Do not bring valuables to the hospital.  Contacts, dentures or bridgework may not be worn into surgery.  Leave suitcase in the car. After surgery it may be brought to your room.  For patients admitted to the hospital more than one night, checkout time is            11:00 AM                                                       The day of discharge.   Patients discharged the day of surgery will not be allowed to drive home.            If going home same day of surgery, must have someone stay with you              FIRST 24 hrs at home and arrange for some one to drive you              home from hospital.   ________________________________________________________________________                                                                        [name]  Before surgery, you can play an important role.  Because skin is not sterile, your skin needs to be as free of germs as possible.  You can reduce the number of germs on your skin by washing with CHG (chlorahexidine gluconate) soap before surgery.  CHG is an antiseptic cleaner which kills germs and bonds with  the skin to continue killing germs even after washing. Please DO NOT use if you have an allergy to CHG or antibacterial soaps.  If your skin becomes reddened/irritated stop using the CHG and inform your nurse when you arrive at Short Stay. Do not shave (including legs and underarms) for at least 48 hours prior to the first CHG shower.  You may shave your face. Please follow these instructions carefully:   1.  Shower with CHG Soap the night before surgery and the  morning of Surgery.   2.  If you choose to wash your hair, wash your hair first as usual with your  normal  Shampoo.   3.  After you shampoo, rinse your hair and body thoroughly to remove the  shampoo.                                         4.  Use CHG as you would any other liquid soap.  You can apply chg directly  to the skin and wash . Gently wash with scrungie or clean wascloth    5.  Apply the CHG Soap to your body ONLY FROM THE NECK DOWN.   Do not use on open                           Wound or open sores. Avoid contact with eyes, ears mouth and genitals (private parts).                        Genitals (private parts) with your normal soap.              6.  Wash thoroughly, paying special attention to the area where your surgery  will be performed.   7.  Thoroughly rinse your body with warm water from the neck down.   8.  DO NOT shower/wash with your normal soap after using and rinsing off  the CHG Soap .                9.  Pat yourself dry with a clean towel.             10.  Wear clean pajamas.             11.  Place clean sheets on your bed the night of your first shower and do not  sleep with pets.  Day of Surgery : Do not apply any lotions/deodorants the morning of surgery.  Please wear clean clothes to the hospital/surgery center.  FAILURE TO FOLLOW THESE INSTRUCTIONS MAY RESULT IN THE CANCELLATION OF YOUR SURGERY    PATIENT  SIGNATURE_________________________________  ______________________________________________________________________     Thomas Pham  An incentive spirometer is a tool that can help keep your lungs clear and active. This tool measures how well you are filling your lungs with each breath. Taking long deep breaths may help reverse or decrease the chance of developing breathing (pulmonary) problems (especially infection) following:  A long period of time when you are unable to move or be active. BEFORE THE PROCEDURE   If the spirometer includes an indicator to show your best effort, your nurse  or respiratory therapist will set it to a desired goal.  If possible, sit up straight or lean slightly forward. Try not to slouch.  Hold the incentive spirometer in an upright position. INSTRUCTIONS FOR USE  1. Sit on the edge of your bed if possible, or sit up as far as you can in bed or on a chair. 2. Hold the incentive spirometer in an upright position. 3. Breathe out normally. 4. Place the mouthpiece in your mouth and seal your lips tightly around it. 5. Breathe in slowly and as deeply as possible, raising the piston or the ball toward the top of the column. 6. Hold your breath for 3-5 seconds or for as long as possible. Allow the piston or ball to fall to the bottom of the column. 7. Remove the mouthpiece from your mouth and breathe out normally. 8. Rest for a few seconds and repeat Steps 1 through 7 at least 10 times every 1-2 hours when you are awake. Take your time and take a few normal breaths between deep breaths. 9. The spirometer may include an indicator to show your best effort. Use the indicator as a goal to work toward during each repetition. 10. After each set of 10 deep breaths, practice coughing to be sure your lungs are clear. If you have an incision (the cut made at the time of surgery), support your incision when coughing by placing a pillow or rolled up towels firmly  against it. Once you are able to get out of bed, walk around indoors and cough well. You may stop using the incentive spirometer when instructed by your caregiver.  RISKS AND COMPLICATIONS  Take your time so you do not get dizzy or light-headed.  If you are in pain, you may need to take or ask for pain medication before doing incentive spirometry. It is harder to take a deep breath if you are having pain. AFTER USE  Rest and breathe slowly and easily.  It can be helpful to keep track of a log of your progress. Your caregiver can provide you with a simple table to help with this. If you are using the spirometer at home, follow these instructions: [place] IF:   You are having difficultly using the spirometer.  You have trouble using the spirometer as often as instructed.  Your pain medication is not giving enough relief while using the spirometer.  You develop fever of 100.5 F (38.1 C) or higher. SEEK IMMEDIATE MEDICAL CARE IF:   You cough up bloody sputum that had not been present before.  You develop fever of 102 F (38.9 C) or greater.  You develop worsening pain at or near the incision site. MAKE SURE YOU:   Understand these instructions.  Will watch your condition.  Will get help right away if you are not doing well or get worse. Document Released: 06/15/2006 Document Revised: 04/27/2011 Document Reviewed: 08/16/2006 [facility] Patient Information 2014 [place], [place].   ________________________________________________________________________

## 2014-01-03 ENCOUNTER — Inpatient Hospital Stay (HOSPITAL_COMMUNITY): Admission: RE | Admit: 2014-01-03 | Payer: Self-pay | Source: Ambulatory Visit

## 2014-01-08 ENCOUNTER — Inpatient Hospital Stay (HOSPITAL_COMMUNITY): Admission: RE | Admit: 2014-01-08 | Payer: Self-pay | Source: Ambulatory Visit

## 2014-02-24 ENCOUNTER — Ambulatory Visit: Payer: Self-pay | Admitting: Orthopedic Surgery

## 2014-02-24 NOTE — Progress Notes (Signed)
Preoperative surgical orders have been place into the Epic hospital system for Thomas Pham on 02/24/2014, 9:12 AM  by Mickel Crow for surgery on 03/07/2014.  Preop Knee Scope orders including IV Tylenol and IV Decadron as long as there are no contraindications to the above medications. Arlee Muslim, PA-C

## 2014-03-01 ENCOUNTER — Other Ambulatory Visit (HOSPITAL_COMMUNITY): Payer: Self-pay | Admitting: *Deleted

## 2014-03-02 ENCOUNTER — Encounter (HOSPITAL_COMMUNITY): Payer: Self-pay

## 2014-03-02 ENCOUNTER — Encounter (HOSPITAL_COMMUNITY)
Admission: RE | Admit: 2014-03-02 | Discharge: 2014-03-02 | Disposition: A | Discharge status: Home / Self Care | Payer: BLUE CROSS/BLUE SHIELD | Source: Ambulatory Visit | Attending: Orthopedic Surgery | Admitting: Orthopedic Surgery

## 2014-03-02 ENCOUNTER — Ambulatory Visit (HOSPITAL_COMMUNITY)
Admission: RE | Admit: 2014-03-02 | Discharge: 2014-03-02 | Disposition: A | Discharge status: Home / Self Care | Payer: BLUE CROSS/BLUE SHIELD | Source: Ambulatory Visit | Attending: Anesthesiology | Admitting: Anesthesiology

## 2014-03-02 ENCOUNTER — Other Ambulatory Visit: Payer: Self-pay | Admitting: Surgical

## 2014-03-02 DIAGNOSIS — Z01818 Encounter for other preprocedural examination: Secondary | ICD-10-CM | POA: Diagnosis not present

## 2014-03-02 DIAGNOSIS — I10 Essential (primary) hypertension: Secondary | ICD-10-CM

## 2014-03-02 LAB — CBC
HCT: 45.7 % (ref 39.0–52.0)
Hemoglobin: 14.7 g/dL (ref 13.0–17.0)
MCH: 27.5 pg (ref 26.0–34.0)
MCHC: 32.2 g/dL (ref 30.0–36.0)
MCV: 85.6 fL (ref 78.0–100.0)
Platelets: 156 10*3/uL (ref 150–400)
RBC: 5.34 MIL/uL (ref 4.22–5.81)
RDW: 14.6 % (ref 11.5–15.5)
WBC: 7.5 10*3/uL (ref 4.0–10.5)

## 2014-03-02 LAB — BASIC METABOLIC PANEL
Anion gap: 8 (ref 5–15)
BUN: 19 mg/dL (ref 6–23)
CO2: 28 mmol/L (ref 19–32)
Calcium: 9 mg/dL (ref 8.4–10.5)
Chloride: 99 mEq/L (ref 96–112)
Creatinine, Ser: 1.34 mg/dL (ref 0.50–1.35)
GFR calc Af Amer: 67 mL/min — ABNORMAL LOW (ref >90)
GFR calc non Af Amer: 58 mL/min — ABNORMAL LOW (ref >90)
Glucose, Bld: 172 mg/dL — ABNORMAL HIGH (ref 70–99)
Potassium: 3.7 mmol/L (ref 3.5–5.1)
Sodium: 135 mmol/L (ref 135–145)

## 2014-03-02 LAB — URINALYSIS, ROUTINE W REFLEX MICROSCOPIC
Bilirubin Urine: NEGATIVE
Glucose, UA: NEGATIVE mg/dL
Ketones, ur: NEGATIVE mg/dL
Leukocytes, UA: NEGATIVE
Nitrite: NEGATIVE
Protein, ur: NEGATIVE mg/dL
Specific Gravity, Urine: 1.02 (ref 1.005–1.030)
Urobilinogen, UA: 0.2 mg/dL (ref 0.0–1.0)
pH: 6 (ref 5.0–8.0)

## 2014-03-02 LAB — URINE MICROSCOPIC-ADD ON

## 2014-03-02 NOTE — Progress Notes (Signed)
Drew please put in [facility]  consent form with correct knee to have surgery on! Thank you!

## 2014-03-02 NOTE — Progress Notes (Signed)
   03/02/14 1233  OBSTRUCTIVE SLEEP APNEA  Have you ever been diagnosed with sleep apnea through a sleep study? No  Do you snore loudly (loud enough to be heard through closed doors)?  0  Do you often feel tired, fatigued, or sleepy during the daytime? 1  Has anyone observed you stop breathing during your sleep? 0  Do you have, or are you being treated for high blood pressure? 1  BMI more than 35 kg/m2? 1  Age over 57 years old? 1  Neck circumference greater than 40 cm/16 inches? 1  Gender: 1  Obstructive Sleep Apnea Score 6  Score 4 or greater  Results sent to PCP

## 2014-03-02 NOTE — Patient Instructions (Signed)
ABELINO TIPPIN  03/02/2014   Your procedure is scheduled on: Wednesday 03/07/2014  Report to [facility] Main  Entrance and follow signs to               [place] at [place] AM.  Call this number if you have problems the morning of surgery [phone]   Remember:  Do not eat food or drink liquids :After Midnight.     Take these medicines the morning of surgery with A SIP OF WATER: Singulair, Nasarel nasal spray, Nexium                               You may not have any metal on your body including hair pins and              piercings  Do not wear jewelry, make-up, lotions, powders or perfumes.             Do not wear nail polish.  Do not shave  48 hours prior to surgery.              Men may shave face and neck.   Do not bring valuables to the hospital. [place].  Contacts, dentures or bridgework may not be worn into surgery.  Leave suitcase in the car. After surgery it may be brought to your room.     Patients discharged the day of surgery will not be allowed to drive home.  Name and phone number of your driver:  Special Instructions: N/A              Please read over the following fact sheets you were given: _____________________________________________________________________             [facility] - Preparing for Surgery Before surgery, you can play an important role.  Because skin is not sterile, your skin needs to be as free of germs as possible.  You can reduce the number of germs on your skin by washing with CHG (chlorahexidine gluconate) soap before surgery.  CHG is an antiseptic cleaner which kills germs and bonds with the skin to continue killing germs even after washing. Please DO NOT use if you have an allergy to CHG or antibacterial soaps.  If your skin becomes reddened/irritated stop using the CHG and inform your nurse when you arrive at Short Stay. Do not shave (including legs and  underarms) for at least 48 hours prior to the first CHG shower.  You may shave your face/neck. Please follow these instructions carefully:  1.  Shower with CHG Soap the night before surgery and the  morning of Surgery.  2.  If you choose to wash your hair, wash your hair first as usual with your  normal  shampoo.  3.  After you shampoo, rinse your hair and body thoroughly to remove the  shampoo.                           4.  Use CHG as you would any other liquid soap.  You can apply chg directly  to the skin and wash  Gently with a scrungie or clean washcloth.  5.  Apply the CHG Soap to your body ONLY FROM THE NECK DOWN.   Do not use on face/ open                           Wound or open sores. Avoid contact with eyes, ears mouth and genitals (private parts).                       Wash face,  Genitals (private parts) with your normal soap.             6.  Wash thoroughly, paying special attention to the area where your surgery  will be performed.  7.  Thoroughly rinse your body with warm water from the neck down.  8.  DO NOT shower/wash with your normal soap after using and rinsing off  the CHG Soap.                9.  Pat yourself dry with a clean towel.            10.  Wear clean pajamas.            11.  Place clean sheets on your bed the night of your first shower and do not  sleep with pets. Day of Surgery : Do not apply any lotions/deodorants the morning of surgery.  Please wear clean clothes to the hospital/surgery center.  FAILURE TO FOLLOW THESE INSTRUCTIONS MAY RESULT IN THE CANCELLATION OF YOUR SURGERY PATIENT SIGNATURE_________________________________  NURSE SIGNATURE__________________________________  ________________________________________________________________________   [name]  An incentive spirometer is a tool that can help keep your lungs clear and active. This tool measures how well you are filling your lungs with each breath. Taking  long deep breaths may help reverse or decrease the chance of developing breathing (pulmonary) problems (especially infection) following:  A long period of time when you are unable to move or be active. BEFORE THE PROCEDURE   If the spirometer includes an indicator to show your best effort, your nurse or respiratory therapist will set it to a desired goal.  If possible, sit up straight or lean slightly forward. Try not to slouch.  Hold the incentive spirometer in an upright position. INSTRUCTIONS FOR USE  1. Sit on the edge of your bed if possible, or sit up as far as you can in bed or on a chair. 2. Hold the incentive spirometer in an upright position. 3. Breathe out normally. 4. Place the mouthpiece in your mouth and seal your lips tightly around it. 5. Breathe in slowly and as deeply as possible, raising the piston or the ball toward the top of the column. 6. Hold your breath for 3-5 seconds or for as long as possible. Allow the piston or ball to fall to the bottom of the column. 7. Remove the mouthpiece from your mouth and breathe out normally. 8. Rest for a few seconds and repeat Steps 1 through 7 at least 10 times every 1-2 hours when you are awake. Take your time and take a few normal breaths between deep breaths. 9. The spirometer may include an indicator to show your best effort. Use the indicator as a goal to work toward during each repetition. 10. After each set of 10 deep breaths, practice coughing to be sure your lungs are clear. If you have an incision (the cut made at the time of surgery),  support your incision when coughing by placing a pillow or rolled up towels firmly against it. Once you are able to get out of bed, walk around indoors and cough well. You may stop using the incentive spirometer when instructed by your caregiver.  RISKS AND COMPLICATIONS  Take your time so you do not get dizzy or light-headed.  If you are in pain, you may need to take or ask for pain  medication before doing incentive spirometry. It is harder to take a deep breath if you are having pain. AFTER USE  Rest and breathe slowly and easily.  It can be helpful to keep track of a log of your progress. Your caregiver can provide you with a simple table to help with this. If you are using the spirometer at home, follow these instructions: [place] IF:   You are having difficultly using the spirometer.  You have trouble using the spirometer as often as instructed.  Your pain medication is not giving enough relief while using the spirometer.  You develop fever of 100.5 F (38.1 C) or higher. SEEK IMMEDIATE MEDICAL CARE IF:   You cough up bloody sputum that had not been present before.  You develop fever of 102 F (38.9 C) or greater.  You develop worsening pain at or near the incision site. MAKE SURE YOU:   Understand these instructions.  Will watch your condition.  Will get help right away if you are not doing well or get worse. Document Released: 06/15/2006 Document Revised: 04/27/2011 Document Reviewed: 08/16/2006 [facility] Patient Information 2014 [place], [place].   ________________________________________________________________________

## 2014-03-02 NOTE — Progress Notes (Signed)
Called [place] and talked to [employer], [place] as patient hasalot of questions concerning recovery time and his activity after surgery as his work requires him to drive at long distances and he states he needs to return to work probably the Monday after surgery on Wednesday.

## 2014-03-06 DIAGNOSIS — S83249A Other tear of medial meniscus, current injury, unspecified knee, initial encounter: Secondary | ICD-10-CM | POA: Diagnosis present

## 2014-03-06 NOTE — Anesthesia Preprocedure Evaluation (Addendum)
Anesthesia Evaluation  Patient identified by MRN, date of birth, ID band Patient awake    Reviewed: Allergy & Precautions, H&P , NPO status , Patient's Chart, lab work & pertinent test results  Airway Mallampati: III  TM Distance: >3 FB Neck ROM: Full    Dental no notable dental hx. (+) Teeth Intact, Dental Advisory Given   Pulmonary neg pulmonary ROS,  breath sounds clear to auscultation  Pulmonary exam normal       Cardiovascular hypertension, On Medications Rhythm:Regular Rate:Normal     Neuro/Psych negative neurological ROS  negative psych ROS   GI/Hepatic Neg liver ROS, GERD-  Medicated and Controlled,  Endo/Other  negative endocrine ROS  Renal/GU negative Renal ROS  negative genitourinary   Musculoskeletal   Abdominal   Peds  Hematology negative hematology ROS (+)   Anesthesia Other Findings   Reproductive/Obstetrics negative OB ROS                           Anesthesia Physical Anesthesia Plan  ASA: II  Anesthesia Plan: General   Post-op Pain Management:    Induction: Intravenous  Airway Management Planned: LMA  Additional Equipment:   Intra-op Plan:   Post-operative Plan: Extubation in OR  Informed Consent: I have reviewed the patients History and Physical, chart, labs and discussed the procedure including the risks, benefits and alternatives for the proposed anesthesia with the patient or authorized representative who has indicated his/her understanding and acceptance.   Dental advisory given  Plan Discussed with: CRNA  Anesthesia Plan Comments:         Anesthesia Quick Evaluation

## 2014-03-06 NOTE — H&P (Signed)
[name] is a 59 y.o. male who presents with left knee pain.  HPI- . Knee Pain: Patient presents with knee pain involving the  left knee. Onset of the symptoms was several months ago. Inciting event: none known. Current symptoms include giving out, locking and pain located medially. Pain is aggravated by lateral movements, squatting and walking.  Patient has had no prior knee problems. Evaluation to date: MRI: abnormal medial meniscal tear. Treatment to date: corticosteroid injection which was not very effective.  Past Medical History  Diagnosis Date  . Chronic kidney disease   . Allergy   . Hypertension   . Thyroid disease   . GERD (gastroesophageal reflux disease)   . Hyperlipidemia   . Organic impotence   . Penile cyst   . Low back pain   . Medial meniscus tear     LEFT  . Arthritis   . History of kidney stones   . Difficulty sleeping   . History of gout     Past Surgical History  Procedure Laterality Date  . Thyroidectomy Right   . Tonsillectomy      Prior to Admission medications   Medication Sig Start Date End Date Taking? Authorizing Provider  aspirin EC 81 MG tablet Take 81 mg by mouth daily.   Yes Historical Provider, MD  Calcium Carbonate-Vitamin D (OSCAL 500/200 D-3 PO) Take 500 mg by mouth 3 (three) times daily.    Yes Historical Provider, MD  cetirizine (ZYRTEC) 10 MG tablet Take 10 mg by mouth daily.   Yes Historical Provider, MD  esomeprazole (NEXIUM) 20 MG capsule Take 20 mg by mouth daily.    Yes Historical Provider, MD  flunisolide (NASAREL) 29 MCG/ACT (0.025%) nasal spray Place 2 sprays into the nose as needed for rhinitis. Dose is for each nostril.   Yes Historical Provider, MD  HYDROcodone-acetaminophen (NORCO/VICODIN) 5-325 MG per tablet Take 1-2 tablets by mouth every 6 (six) hours as needed for moderate pain or severe pain.    Yes Historical Provider, MD  montelukast (SINGULAIR) 10 MG tablet Take 10 mg by mouth every morning.    Yes Historical  Provider, MD  olmesartan (BENICAR) 20 MG tablet Take 20 mg by mouth every morning.    Yes Historical Provider, MD  ranitidine (ZANTAC) 150 MG tablet Take 150 mg by mouth 2 (two) times daily.   Yes Historical Provider, MD  sildenafil (VIAGRA) 100 MG tablet Take 100 mg by mouth daily as needed for erectile dysfunction.   Yes Historical Provider, MD  simvastatin (ZOCOR) 40 MG tablet Take 40 mg by mouth daily.   Yes Historical Provider, MD  Vitamin D, Ergocalciferol, (DRISDOL) 50000 UNITS CAPS capsule Take 50,000 Units by mouth every 7 (seven) days. On tuesdays   Yes Historical Provider, MD  colchicine 0.6 MG tablet Take 0.6 mg by mouth daily.    Historical Provider, MD  febuxostat (ULORIC) 40 MG tablet Take 40 mg by mouth daily.     Historical Provider, MD   KNEE EXAM antalgic gait, soft tissue tenderness over medial joint line, no effusion, negative pivot-shift, collateral ligaments intact  Physical Examination: General appearance - alert, well appearing, and in no distress Mental status - alert, oriented to person, place, and time Chest - clear to auscultation, no wheezes, rales or rhonchi, symmetric air entry Heart - normal rate, regular rhythm, normal S1, S2, no murmurs, rubs, clicks or gallops Abdomen - soft, nontender, nondistended, no masses or organomegaly Neurological - alert, oriented, normal speech,  no focal findings or movement disorder noted   Asessment/Plan--- Left knee medial meniscal tear- - Plan left knee arthroscopy with meniscal debridement. Procedure risks and potential comps discussed with patient who elects to proceed. Goals are decreased pain and increased function with a high likelihood of achieving both

## 2014-03-07 ENCOUNTER — Ambulatory Visit (HOSPITAL_COMMUNITY): Payer: BLUE CROSS/BLUE SHIELD | Admitting: Anesthesiology

## 2014-03-07 ENCOUNTER — Ambulatory Visit (HOSPITAL_COMMUNITY)
Admission: RE | Admit: 2014-03-07 | Discharge: 2014-03-07 | Disposition: A | Discharge status: Home / Self Care | Payer: BLUE CROSS/BLUE SHIELD | Source: Ambulatory Visit | Attending: Orthopedic Surgery | Admitting: Orthopedic Surgery

## 2014-03-07 ENCOUNTER — Encounter (HOSPITAL_COMMUNITY): Payer: Self-pay | Admitting: Certified Registered Nurse Anesthetist

## 2014-03-07 ENCOUNTER — Encounter (HOSPITAL_COMMUNITY)
Admission: RE | Disposition: A | Discharge status: Home / Self Care | Payer: Self-pay | Source: Ambulatory Visit | Attending: Orthopedic Surgery

## 2014-03-07 DIAGNOSIS — S83242A Other tear of medial meniscus, current injury, left knee, initial encounter: Secondary | ICD-10-CM | POA: Insufficient documentation

## 2014-03-07 DIAGNOSIS — E785 Hyperlipidemia, unspecified: Secondary | ICD-10-CM | POA: Diagnosis not present

## 2014-03-07 DIAGNOSIS — Z79899 Other long term (current) drug therapy: Secondary | ICD-10-CM | POA: Insufficient documentation

## 2014-03-07 DIAGNOSIS — Y929 Unspecified place or not applicable: Secondary | ICD-10-CM | POA: Insufficient documentation

## 2014-03-07 DIAGNOSIS — Y999 Unspecified external cause status: Secondary | ICD-10-CM | POA: Insufficient documentation

## 2014-03-07 DIAGNOSIS — I129 Hypertensive chronic kidney disease with stage 1 through stage 4 chronic kidney disease, or unspecified chronic kidney disease: Secondary | ICD-10-CM | POA: Diagnosis not present

## 2014-03-07 DIAGNOSIS — Y939 Activity, unspecified: Secondary | ICD-10-CM | POA: Insufficient documentation

## 2014-03-07 DIAGNOSIS — E079 Disorder of thyroid, unspecified: Secondary | ICD-10-CM | POA: Insufficient documentation

## 2014-03-07 DIAGNOSIS — Z7982 Long term (current) use of aspirin: Secondary | ICD-10-CM | POA: Insufficient documentation

## 2014-03-07 DIAGNOSIS — X58XXXA Exposure to other specified factors, initial encounter: Secondary | ICD-10-CM | POA: Diagnosis not present

## 2014-03-07 DIAGNOSIS — M109 Gout, unspecified: Secondary | ICD-10-CM | POA: Insufficient documentation

## 2014-03-07 DIAGNOSIS — K219 Gastro-esophageal reflux disease without esophagitis: Secondary | ICD-10-CM | POA: Diagnosis not present

## 2014-03-07 DIAGNOSIS — N189 Chronic kidney disease, unspecified: Secondary | ICD-10-CM | POA: Insufficient documentation

## 2014-03-07 DIAGNOSIS — Z87442 Personal history of urinary calculi: Secondary | ICD-10-CM | POA: Diagnosis not present

## 2014-03-07 DIAGNOSIS — M1990 Unspecified osteoarthritis, unspecified site: Secondary | ICD-10-CM | POA: Insufficient documentation

## 2014-03-07 DIAGNOSIS — S83249A Other tear of medial meniscus, current injury, unspecified knee, initial encounter: Secondary | ICD-10-CM | POA: Diagnosis present

## 2014-03-07 HISTORY — PX: KNEE ARTHROSCOPY: SHX127

## 2014-03-07 LAB — GLUCOSE, CAPILLARY: Glucose-Capillary: 87 mg/dL (ref 70–99)

## 2014-03-07 SURGERY — ARTHROSCOPY, KNEE
Anesthesia: General | Site: Knee | Laterality: Left

## 2014-03-07 MED ORDER — BUPIVACAINE-EPINEPHRINE 0.25% -1:200000 IJ SOLN
INTRAMUSCULAR | Status: DC | PRN
  Administered 2014-03-07: 20 mL

## 2014-03-07 MED ORDER — HYDROCODONE-ACETAMINOPHEN 5-325 MG PO TABS: 1.0000 | ORAL_TABLET | ORAL | Status: DC | PRN

## 2014-03-07 MED ORDER — EPHEDRINE SULFATE 50 MG/ML IJ SOLN
INTRAMUSCULAR | Status: AC
  Filled 2014-03-07: qty 1

## 2014-03-07 MED ORDER — LACTATED RINGERS IV SOLN: INTRAVENOUS | Status: DC

## 2014-03-07 MED ORDER — LACTATED RINGERS IV SOLN
INTRAVENOUS | Status: DC | PRN
  Administered 2014-03-07: 10:00:00 via INTRAVENOUS

## 2014-03-07 MED ORDER — SODIUM CHLORIDE 0.9 % IV SOLN: INTRAVENOUS | Status: DC

## 2014-03-07 MED ORDER — ACETAMINOPHEN 10 MG/ML IV SOLN
1000.0000 mg | Freq: Once | INTRAVENOUS | Status: AC
  Administered 2014-03-07: 1000 mg via INTRAVENOUS
  Filled 2014-03-07: qty 100

## 2014-03-07 MED ORDER — HYDROMORPHONE HCL 1 MG/ML IJ SOLN
0.2500 mg | INTRAMUSCULAR | Status: DC | PRN
  Administered 2014-03-07 (×2): 0.5 mg via INTRAVENOUS

## 2014-03-07 MED ORDER — HYDROMORPHONE HCL 1 MG/ML IJ SOLN
INTRAMUSCULAR | Status: AC
  Filled 2014-03-07: qty 1

## 2014-03-07 MED ORDER — CHLORHEXIDINE GLUCONATE 4 % EX LIQD: 60.0000 mL | Freq: Once | CUTANEOUS | Status: DC

## 2014-03-07 MED ORDER — SODIUM CHLORIDE 0.9 % IJ SOLN
INTRAMUSCULAR | Status: AC
  Filled 2014-03-07: qty 10

## 2014-03-07 MED ORDER — FENTANYL CITRATE 0.05 MG/ML IJ SOLN
INTRAMUSCULAR | Status: AC
  Filled 2014-03-07: qty 5

## 2014-03-07 MED ORDER — METHOCARBAMOL 500 MG PO TABS: 500.0000 mg | ORAL_TABLET | Freq: Four times a day (QID) | ORAL | Status: DC

## 2014-03-07 MED ORDER — ONDANSETRON HCL 4 MG/2ML IJ SOLN
INTRAMUSCULAR | Status: DC | PRN
  Administered 2014-03-07: 4 mg via INTRAVENOUS

## 2014-03-07 MED ORDER — PROPOFOL 10 MG/ML IV BOLUS
INTRAVENOUS | Status: DC | PRN
  Administered 2014-03-07: 200 mg via INTRAVENOUS

## 2014-03-07 MED ORDER — MIDAZOLAM HCL 5 MG/5ML IJ SOLN
INTRAMUSCULAR | Status: DC | PRN
  Administered 2014-03-07: 2 mg via INTRAVENOUS

## 2014-03-07 MED ORDER — LIDOCAINE HCL (CARDIAC) 20 MG/ML IV SOLN
INTRAVENOUS | Status: DC | PRN
  Administered 2014-03-07: 100 mg via INTRAVENOUS

## 2014-03-07 MED ORDER — LACTATED RINGERS IR SOLN
Status: DC | PRN
  Administered 2014-03-07: 6000 mL

## 2014-03-07 MED ORDER — DEXAMETHASONE SODIUM PHOSPHATE 10 MG/ML IJ SOLN
10.0000 mg | Freq: Once | INTRAMUSCULAR | Status: AC
  Administered 2014-03-07: 10 mg via INTRAVENOUS

## 2014-03-07 MED ORDER — BUPIVACAINE-EPINEPHRINE (PF) 0.25% -1:200000 IJ SOLN
INTRAMUSCULAR | Status: AC
  Filled 2014-03-07: qty 30

## 2014-03-07 MED ORDER — LIDOCAINE HCL (CARDIAC) 20 MG/ML IV SOLN
INTRAVENOUS | Status: AC
  Filled 2014-03-07: qty 5

## 2014-03-07 MED ORDER — CEFAZOLIN SODIUM-DEXTROSE 2-3 GM-% IV SOLR
INTRAVENOUS | Status: AC
Start: 2014-03-07 — End: 2014-03-07
  Filled 2014-03-07: qty 50

## 2014-03-07 MED ORDER — FENTANYL CITRATE 0.05 MG/ML IJ SOLN
INTRAMUSCULAR | Status: DC | PRN
  Administered 2014-03-07 (×4): 50 ug via INTRAVENOUS

## 2014-03-07 MED ORDER — PROPOFOL 10 MG/ML IV BOLUS
INTRAVENOUS | Status: AC
  Filled 2014-03-07: qty 20

## 2014-03-07 MED ORDER — MIDAZOLAM HCL 2 MG/2ML IJ SOLN
INTRAMUSCULAR | Status: AC
  Filled 2014-03-07: qty 2

## 2014-03-07 MED ORDER — ONDANSETRON HCL 4 MG/2ML IJ SOLN
INTRAMUSCULAR | Status: AC
  Filled 2014-03-07: qty 2

## 2014-03-07 MED ORDER — CEFAZOLIN SODIUM-DEXTROSE 2-3 GM-% IV SOLR
2.0000 g | INTRAVENOUS | Status: AC
  Administered 2014-03-07: 2 g via INTRAVENOUS

## 2014-03-07 MED ORDER — ATROPINE SULFATE 0.4 MG/ML IJ SOLN
INTRAMUSCULAR | Status: AC
  Filled 2014-03-07: qty 1

## 2014-03-07 SURGICAL SUPPLY — 26 items
BANDAGE ELASTIC 6 VELCRO ST LF (GAUZE/BANDAGES/DRESSINGS) ×5 IMPLANT
BLADE 4.2CUDA (BLADE) ×3 IMPLANT
BLADE SURG SZ11 CARB STEEL (BLADE) IMPLANT
CUFF TOURN SGL QUICK 34 (TOURNIQUET CUFF) ×3
CUFF TRNQT CYL 34X4X40X1 (TOURNIQUET CUFF) ×1 IMPLANT
DRAPE U-SHAPE 47X51 STRL (DRAPES) ×3 IMPLANT
DRSG EMULSION OIL 3X3 NADH (GAUZE/BANDAGES/DRESSINGS) ×3 IMPLANT
DRSG PAD ABDOMINAL 8X10 ST (GAUZE/BANDAGES/DRESSINGS) ×3 IMPLANT
DURAPREP 26ML APPLICATOR (WOUND CARE) ×3 IMPLANT
GAUZE SPONGE 4X4 12PLY STRL (GAUZE/BANDAGES/DRESSINGS) ×3 IMPLANT
GLOVE BIO SURGEON STRL SZ8 (GLOVE) ×3 IMPLANT
GLOVE BIOGEL PI IND STRL 8 (GLOVE) ×1 IMPLANT
GLOVE BIOGEL PI INDICATOR 8 (GLOVE) ×2
GOWN STRL REUS W/TWL LRG LVL3 (GOWN DISPOSABLE) ×3 IMPLANT
KIT BASIN OR (CUSTOM PROCEDURE TRAY) ×3 IMPLANT
MANIFOLD NEPTUNE II (INSTRUMENTS) ×3 IMPLANT
PACK ARTHROSCOPY WL (CUSTOM PROCEDURE TRAY) ×3 IMPLANT
PACK ICE MAXI GEL EZY WRAP (MISCELLANEOUS) ×9 IMPLANT
PADDING CAST COTTON 6X4 STRL (CAST SUPPLIES) ×4 IMPLANT
POSITIONER SURGICAL ARM (MISCELLANEOUS) ×3 IMPLANT
SET ARTHROSCOPY TUBING (MISCELLANEOUS) ×3
SET ARTHROSCOPY TUBING LN (MISCELLANEOUS) ×1 IMPLANT
SUT ETHILON 4 0 PS 2 18 (SUTURE) ×3 IMPLANT
TOWEL OR 17X26 10 PK STRL BLUE (TOWEL DISPOSABLE) ×3 IMPLANT
WAND 90 DEG TURBOVAC W/CORD (SURGICAL WAND) ×2 IMPLANT
WRAP KNEE MAXI GEL POST OP (GAUZE/BANDAGES/DRESSINGS) ×3 IMPLANT

## 2014-03-07 NOTE — Anesthesia Postprocedure Evaluation (Signed)
  Anesthesia Post-op Note  Patient: Thomas Pham  Procedure(s) Performed: Procedure(s): LEFT ARTHROSCOPY KNEE WITH MEDIAL MENISCECTOMY  DEBRIDEMENT AND CHONDROPLASTY  (Left)  Patient Location: PACU  Anesthesia Type:General  Level of Consciousness: awake and alert   Airway and Oxygen Therapy: Patient Spontanous Breathing  Post-op Pain: none  Post-op Assessment: Post-op Vital signs reviewed, Patient's Cardiovascular Status Stable and Respiratory Function Stable  Post-op Vital Signs: Reviewed  Filed Vitals:   03/07/14 1120  BP:   Pulse: 64  Temp:   Resp: 16    Complications: No apparent anesthesia complications

## 2014-03-07 NOTE — Discharge Instructions (Signed)
Arthroscopic Procedure, Knee °An arthroscopic procedure can find what is wrong with your knee. °PROCEDURE °Arthroscopy is a surgical technique that allows your orthopedic surgeon to diagnose and treat your knee injury with accuracy. They will look into your knee through a small instrument. This is almost like a small (pencil sized) telescope. Because arthroscopy affects your knee less than open knee surgery, you can anticipate a more rapid recovery. Taking an active role by following your caregiver's instructions will help with rapid and complete recovery. Use crutches, rest, elevation, ice, and knee exercises as instructed. The length of recovery depends on various factors including type of injury, age, physical condition, medical conditions, and your rehabilitation. °Your knee is the joint between the large bones (femur and tibia) in your leg. Cartilage covers these bone ends which are smooth and slippery and allow your knee to bend and move smoothly. Two menisci, thick, semi-lunar shaped pads of cartilage which form a rim inside the joint, help absorb shock and stabilize your knee. Ligaments bind the bones together and support your knee joint. Muscles move the joint, help support your knee, and take stress off the joint itself. Because of this all programs and physical therapy to rehabilitate an injured or repaired knee require rebuilding and strengthening your muscles. °AFTER THE PROCEDURE °· After the procedure, you will be moved to a recovery area until most of the effects of the medication have worn off. Your caregiver will discuss the test results with you.  °· Only take over-the-counter or prescription medicines for pain, discomfort, or fever as directed by your caregiver.  °SEEK MEDICAL CARE IF:  °· You have increased bleeding from your wounds.  °· You see redness, swelling, or have increasing pain in your wounds.  °· You have pus coming from your wound.  °· You have an oral temperature above 102° F (38.9°  C).  °· You notice a bad smell coming from the wound or dressing.  °· You have severe pain with any motion of your knee.  °SEEK IMMEDIATE MEDICAL CARE IF:  °· You develop a rash.  °· You have difficulty breathing.  °· You have any allergic problems.  °FURTHER INSTRUCTIONS: °· You may start showering two days after being discharged home but do not submerge the incisions under water.  °· Change dressing 48 hours after the procedure and then cover the small incisions with band aids until your follow up visit. °· Avoid periods of inactivity such as sitting longer than an hour when not asleep. This helps prevent blood clots.  °· You may put full weight on your legs and walk as much as is comfortable.  °· Do not drive while taking narcotics.  °Wear the elastic stockings for three weeks following surgery during the day but you may remove then at night. °· Make sure you keep all of your appointments after your operation with all of your doctors and caregivers. You should call the office at [phone] and make an appointment for approximately one week after the date of your surgery. °· Please pick up a stool softener and laxative for home use as long as you are requiring pain medications. °· ICE to the affected knee every three hours for 30 minutes at a time and then as needed for pain and swelling.  Continue to use ice on the knee for pain and swelling from surgery. You may notice swelling that will progress down to the foot and ankle.  This is normal after surgery.  Elevate the   leg when you are not up walking on it.   °RANGE OF MOTION AND STRENGTHENING EXERCISES  °Rehabilitation of the knee is important following a knee injury or an operation. After just a few days of immobilization, the muscles of the thigh which control the knee become weakened and shrink (atrophy). Knee exercises are designed to build up the tone and strength of the thigh muscles and to improve knee motion. Often times heat used for twenty to thirty  minutes before working out will loosen up your tissues and help with improving the range of motion but do not use heat for the first two weeks following surgery. These exercises can be done on a training (exercise) mat, on the floor, on a table or on a bed. Use what ever works the best and is most comfortable for you Knee exercises include: ° ° ° ° ° ° °QUAD STRENGTHENING EXERCISES °Strengthening Quadriceps Sets ° °Tighten muscles on top of thigh by pushing knees down into floor or table. °Hold for 20 seconds. Repeat 10 times. °Do 2 sessions per day. ° ° ° °Strengthening Terminal Knee Extension ° °With knee bent over bolster, straighten knee by tightening muscle on top of thigh. Be sure to keep bottom of knee on bolster. °Hold for 20 seconds. Repeat 10 times. °Do 2 sessions per day. ° ° °Straight Leg with Bent Knee ° °Lie on back with opposite leg bent. Keep involved knee slightly bent at knee and raise leg 4-6". Hold for 10 seconds. °Repeat 20 times per set. °Do 2 sets per session. °Do 2 sessions per day. ° °

## 2014-03-07 NOTE — Transfer of Care (Signed)
Immediate Anesthesia Transfer of Care Note  Patient: Thomas Pham  Procedure(s) Performed: Procedure(s) (LRB): LEFT ARTHROSCOPY KNEE WITH MEDIAL MENISCECTOMY  DEBRIDEMENT AND CHONDROPLASTY  (Left)  Patient Location: PACU  Anesthesia Type: General  Level of Consciousness: sedated, patient cooperative and responds to stimulation  Airway & Oxygen Therapy: Patient Spontanous Breathing and Patient connected to face mask oxgen  Post-op Assessment: Report given to PACU RN and Post -op Vital signs reviewed and stable  Post vital signs: Reviewed and stable  Complications: No apparent anesthesia complications

## 2014-03-07 NOTE — Anesthesia Procedure Notes (Signed)
Procedure Name: LMA Insertion Date/Time: 03/07/2014 10:08 AM Performed by: Montel Clock Pre-anesthesia Checklist: Patient identified, Emergency Drugs available, Suction available, Patient being monitored and Timeout performed Patient Re-evaluated:Patient Re-evaluated prior to inductionOxygen Delivery Method: Circle system utilized Preoxygenation: Pre-oxygenation with 100% oxygen Intubation Type: IV induction Ventilation: Mask ventilation without difficulty LMA: LMA with gastric port inserted LMA Size: 4.0 Number of attempts: 1 Dental Injury: Teeth and Oropharynx as per pre-operative assessment

## 2014-03-07 NOTE — Op Note (Signed)
Preoperative diagnosis-  Left knee medial meniscal tear  Postoperative diagnosis Left- knee medial meniscal tear plus  Chondral defect  Procedure- Left knee arthroscopy with medial meniscal debridement and chondroplasty   Surgeon- Dione Plover. Kepler Mccabe, MD  Anesthesia-General  EBL-  Minimal  Complications- None  Condition- PACU - hemodynamically stable.  Brief clinical note- -Thomas Pham is a 59 y.o.  male with a several month history of left knee pain and mechanical symptoms. Exam and history suggested medial meniscal tear confirmed by MRI. The patient presents now for arthroscopy and debridement   Procedure in detail -       After successful administration of General anesthetic, a tourmiquet is placed high on the Left  thigh and the Left lower extremity is prepped and draped in the usual sterile fashion. Time out is performed by the surgical team. Standard superomedial and inferolateral portal sites are marked and incisions made with an 11 blade. The inflow cannula is passed through the superomedial portal and camera through the inferolateral portal and inflow is initiated. Arthroscopic visualization proceeds.      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 The medial and lateral gutters are visualized and there are  no loose bodies. Flexion and valgus force is applied to the knee and the medial compartment is entered. A spinal needle is passed into the joint through the site marked for the inferomedial portal. A small incision is made and the dilator passed into the joint. The findings for the medial compartment are unstable tear body and posterior horn of the medial meniscus with Grade II chondral changes medial femoral condyle . The tear is debrided to a stable base with baskets and a shaver and sealed off with the Arthrocare. The shaver is used to debride the unstable cartilage to a stable cartilaginous base with  stable edges. It is probed and found to be stable.    The intercondylar notch is visualized and the ACL appears normal. The lateral compartment is entered and the findings are normal.The shaver is used to debride the unstable cartilage in the trochlea to a stable cartilaginous base with stable edges. It is probed and found to be stable.     The joint is again inspected and there are no other tears, defects or loose bodies identified. The arthroscopic equipment is then removed from the inferior portals which are closed with interrupted 4-0 nylon. 20 ml of .25% Marcaine with epinephrine are injected through the inflow cannula and the cannula is then removed and the portal closed with nylon. The incisions are cleaned and dried and a bulky sterile dressing is applied. The patient is then awakened and transported to recovery in stable condition.   03/07/2014, 10:44 AM

## 2014-03-07 NOTE — Interval H&P Note (Signed)
History and Physical Interval Note:  03/07/2014 9:52 AM  Thomas Pham  has presented today for surgery, with the diagnosis of LEFT KNEE MEDIAL MENISCUS TEAR  The various methods of treatment have been discussed with the patient and family. After consideration of risks, benefits and other options for treatment, the patient has consented to  Procedure(s): LEFT ARTHROSCOPY KNEE WITH DEBRIDEMENT (Left) as a surgical intervention .  The patient's history has been reviewed, patient examined, no change in status, stable for surgery.  I have reviewed the patient's chart and labs.  Questions were answered to the patient's satisfaction.     Gearlean Alf

## 2014-03-08 ENCOUNTER — Encounter (HOSPITAL_COMMUNITY): Payer: Self-pay | Admitting: Orthopedic Surgery

## 2014-09-13 ENCOUNTER — Other Ambulatory Visit: Payer: Self-pay | Admitting: Dermatology

## 2014-09-13 DIAGNOSIS — C4491 Basal cell carcinoma of skin, unspecified: Secondary | ICD-10-CM

## 2014-09-13 HISTORY — DX: Basal cell carcinoma of skin, unspecified: C44.91

## 2015-01-17 ENCOUNTER — Other Ambulatory Visit: Payer: Self-pay | Admitting: Dermatology

## 2015-03-20 ENCOUNTER — Other Ambulatory Visit: Payer: Self-pay | Admitting: *Deleted

## 2015-03-20 DIAGNOSIS — I739 Peripheral vascular disease, unspecified: Secondary | ICD-10-CM

## 2015-04-26 ENCOUNTER — Encounter: Payer: Self-pay | Admitting: Vascular Surgery

## 2015-04-30 ENCOUNTER — Encounter: Payer: Self-pay | Admitting: Vascular Surgery

## 2015-04-30 ENCOUNTER — Ambulatory Visit (INDEPENDENT_AMBULATORY_CARE_PROVIDER_SITE_OTHER): Payer: BLUE CROSS/BLUE SHIELD | Admitting: Vascular Surgery

## 2015-04-30 ENCOUNTER — Ambulatory Visit (HOSPITAL_COMMUNITY)
Admission: RE | Admit: 2015-04-30 | Discharge: 2015-04-30 | Disposition: A | Discharge status: Home / Self Care | Payer: BLUE CROSS/BLUE SHIELD | Source: Ambulatory Visit | Attending: Vascular Surgery | Admitting: Vascular Surgery

## 2015-04-30 VITALS — BP 138/84 | HR 75 | Temp 97.7°F | Resp 20 | Ht 68.0 in | Wt 240.0 lb

## 2015-04-30 DIAGNOSIS — I129 Hypertensive chronic kidney disease with stage 1 through stage 4 chronic kidney disease, or unspecified chronic kidney disease: Secondary | ICD-10-CM | POA: Diagnosis not present

## 2015-04-30 DIAGNOSIS — E785 Hyperlipidemia, unspecified: Secondary | ICD-10-CM | POA: Insufficient documentation

## 2015-04-30 DIAGNOSIS — I739 Peripheral vascular disease, unspecified: Secondary | ICD-10-CM

## 2015-04-30 DIAGNOSIS — R252 Cramp and spasm: Secondary | ICD-10-CM

## 2015-04-30 DIAGNOSIS — N189 Chronic kidney disease, unspecified: Secondary | ICD-10-CM | POA: Diagnosis not present

## 2015-04-30 DIAGNOSIS — R0989 Other specified symptoms and signs involving the circulatory and respiratory systems: Secondary | ICD-10-CM | POA: Diagnosis present

## 2015-04-30 NOTE — Progress Notes (Signed)
[facility] [place] [facility]  Patient name: Thomas Pham MRN: [id number] DOB: 1955-05-18 Sex: male  REASON FOR VISIT: Patient resents today for discussion of his lower extremity symptoms. He is concerned that he may have intermittent claudication related to arterial insufficiency. I had seen him in July 2014 where he had the concerns regarding cold sensation in his feet and hands. That time he did not appear to have any evidence of arterial insufficiency and was reassured. He reports now that he has severe cramping in his right leg and can occasionally have associated his left foot as well. This is mainly from his ankle down into his foot and can be brought on by stretching or positioning his foot. He does not have any classic calf claudication with walking. He reports that he underwent thyroidectomy 2014 and had difficulty with a calcium metabolization. Since this is been partially corrected this has improved. His father had severe peripheral vascular occlusive disease and he is concerned this may be present itself as well.    Past Medical History  Diagnosis Date  . Chronic kidney disease   . Allergy   . Hypertension   . Thyroid disease   . GERD (gastroesophageal reflux disease)   . Hyperlipidemia   . Organic impotence   . Penile cyst   . Low back pain   . Medial meniscus tear     LEFT  . Arthritis   . History of kidney stones   . Difficulty sleeping   . History of gout     Family History  Problem Relation Age of Onset  . Hypertension Mother   . Diabetes Mother     prediabetes  . Diabetes Father   . Heart disease Father   . Arthritis Father   . Other Father     alzheimer  . Hyperlipidemia Father   . Hypertension Father   . Stroke Maternal Grandmother   . Other Paternal Grandmother   . Diabetes Paternal Grandmother   . Heart disease Paternal Grandfather   . Other Paternal Grandfather     migraine headache    SOCIAL HISTORY: Social History  Substance  Use Topics  . Smoking status: Never Smoker   . Smokeless tobacco: Never Used  . Alcohol Use: No    No Known Allergies  Current Outpatient Prescriptions  Medication Sig Dispense Refill  . aspirin EC 81 MG tablet Take 81 mg by mouth daily.    . Calcium Carbonate-Vitamin D (OSCAL 500/200 D-3 PO) Take 500 mg by mouth 3 (three) times daily.     . celecoxib (CELEBREX) 200 MG capsule Take 200 mg by mouth daily.    . cetirizine (ZYRTEC) 10 MG tablet Take 10 mg by mouth daily.    . colchicine 0.6 MG tablet Take 0.6 mg by mouth daily.    Marland Kitchen esomeprazole (NEXIUM) 20 MG capsule Take 20 mg by mouth daily.     . febuxostat (ULORIC) 40 MG tablet Take 40 mg by mouth daily.    . flunisolide (NASAREL) 29 MCG/ACT (0.025%) nasal spray Place 2 sprays into the nose as needed for rhinitis. Dose is for each nostril.    Marland Kitchen HYDROcodone-acetaminophen (NORCO/VICODIN) 5-325 MG per tablet Take 1-2 tablets by mouth every 4 (four) hours as needed for moderate pain or severe pain. 40 tablet 0  . levothyroxine (SYNTHROID, LEVOTHROID) 50 MCG tablet Take 50 mcg by mouth daily before breakfast.    . montelukast (SINGULAIR) 10 MG tablet Take 10 mg by mouth  every morning.     . olmesartan (BENICAR) 20 MG tablet Take 20 mg by mouth every morning.     . ranitidine (ZANTAC) 150 MG tablet Take 150 mg by mouth 2 (two) times daily.    . sildenafil (VIAGRA) 100 MG tablet Take 100 mg by mouth daily as needed for erectile dysfunction.    . simvastatin (ZOCOR) 40 MG tablet Take 40 mg by mouth daily.    . Vitamin D, Ergocalciferol, (DRISDOL) 50000 UNITS CAPS capsule Take 50,000 Units by mouth every 7 (seven) days. On tuesdays    . methocarbamol (ROBAXIN) 500 MG tablet Take 1 tablet (500 mg total) by mouth 4 (four) times daily. As needed for muscle spasm (Patient not taking: Reported on 04/30/2015) 30 tablet 1   No current facility-administered medications for this visit.    REVIEW OF SYSTEMS:  [X]  denotes positive finding, [ ]  denotes  negative finding Cardiac  Comments:  Chest pain or chest pressure:    Shortness of breath upon exertion:    Short of breath when lying flat:    Irregular heart rhythm:        Vascular    Pain in calf, thigh, or hip brought on by ambulation: x   Pain in feet at night that wakes you up from your sleep:  x Nocturnal l cramps   Blood clot in your veins:    Leg swelling:         Pulmonary    Oxygen at home:    Productive cough:     Wheezing:         Neurologic    Sudden weakness in arms or legs:     Sudden numbness in arms or legs:     Sudden onset of difficulty speaking or slurred speech:    Temporary loss of vision in one eye:     Problems with dizziness:         Gastrointestinal    Blood in stool:     Vomited blood:         Genitourinary    Burning when urinating:     Blood in urine:        Psychiatric    Major depression:         Hematologic    Bleeding problems:    Problems with blood clotting too easily:        Skin    Rashes or ulcers:        Constitutional    Fever or chills:      PHYSICAL EXAM: Filed Vitals:   04/30/15 0948  BP: 138/84  Pulse: 75  Temp: 97.7 F (36.5 C)  TempSrc: Oral  Resp: 20  Height: 5\' 8"  (1.727 m)  Weight: 240 lb (108.863 kg)  SpO2: 96%    GENERAL: The patient is a well-nourished male, in no acute distress. The vital signs are documented above. CARDIAC: There is a regular rate and rhythm.  VASCULAR: 2+ radial and 2+ dorsalis pedis pulses bilaterally2 PULMONARY: There is good air exchange bilaterally without wheezing or rales. ABDOMEN: Soft and non-tender with normal pitched bowel sounds.  MUSCULOSKELETAL: There are no major deformities or cyanosis. NEUROLOGIC: No focal weakness or paresthesias are detected. SKIN: There are no ulcers or rashes noted. PSYCHIATRIC: The patient has a normal affect.  DATA:  Noninvasive vascular laboratory studies from today were reviewed with the patient. This shows completely normal ankle  arm indices and digital pressures bilaterally. Also with completely normal triphasic waveforms in  his anterior tibial and posterior tibial bilaterally  MEDICAL ISSUES: I discussed with patient. I do not see any evidence of arterial insufficiency. Does not sound typical of neurologic issues since this is brought about by positioning. I explained that in all likelihood he may end up seeing multiple specialists and have no specific answer regarding his symptoms. He is to follow-up with his endocrinologist to assure that there is no ongoing issues with calcium metabolization is as well. He was reassured this discussion will see [place] again on as-needed basis No Follow-up on file.   Curt Jews [facility] [place]: [phone]

## 2015-07-18 ENCOUNTER — Other Ambulatory Visit: Payer: Self-pay | Admitting: Dermatology

## 2015-08-29 ENCOUNTER — Other Ambulatory Visit: Payer: Self-pay | Admitting: Dermatology

## 2015-12-13 ENCOUNTER — Ambulatory Visit: Payer: Self-pay | Admitting: Orthopedic Surgery

## 2016-02-26 ENCOUNTER — Other Ambulatory Visit (HOSPITAL_COMMUNITY): Payer: Self-pay | Admitting: *Deleted

## 2016-02-26 NOTE — Progress Notes (Signed)
Dec 11, 2015 [facility] on chart Medical clearance  breejante williams pa 12-11-15 on chart lov breejanrte williams pa 12-16-15 on chart

## 2016-02-26 NOTE — Patient Instructions (Addendum)
Thomas Pham  02/26/2016   Your procedure is scheduled on: 03-04-16  Report to [facility]  Entrance take [facility]  elevators to 3rd floor to  [place] at 1000 AM.  Call this number if you have problems the morning of surgery [phone]   Remember: ONLY 1 PERSON MAY GO WITH YOU TO SHORT STAY TO GET  READY MORNING OF [name].  Do not eat food  :After Midnight., clear liquids from midnight until 700 am day of surgery, nothing by mouth after 700 am day of surgery.     Take these medicines the morning of surgery with A SIP OF WATER: colchicine, uloric, ranitidine, oscal(patient prefers take oscal)  hydrocone if needed, levothyroxine (synthroid), singulair                               You may not have any metal on your body including hair pins and              piercings  Do not wear jewelry, make-up, lotions, powders or perfumes, deodorant             Do not wear nail polish.  Do not shave  48 hours prior to surgery.              Men may shave face and neck.   Do not bring valuables to the hospital. Portal.  Contacts, dentures or bridgework may not be worn into surgery.  Leave suitcase in the car. After surgery it may be brought to your room.                  Please read over the following fact sheets you were given: _____________________________________________________________________                CLEAR LIQUID DIET   Foods Allowed                                                                     Foods Excluded  Coffee and tea, regular and decaf                             liquids that you cannot  Plain Jell-O in any flavor                                             see through such as: Fruit ices (not with fruit pulp)                                     milk, soups, orange juice  Iced Popsicles  All solid food Carbonated beverages, regular and  diet                                    Cranberry, grape and apple juices Sports drinks like Gatorade Lightly seasoned clear broth or consume(fat free) Sugar, honey syrup  Sample Menu Breakfast                                Lunch                                     Supper Cranberry juice                    Beef broth                            Chicken broth Jell-O                                     Grape juice                           Apple juice Coffee or tea                        Jell-O                                      Popsicle                                                Coffee or tea                        Coffee or tea  _____________________________________________________________________  [facility] - Preparing for Surgery Before surgery, you can play an important role.  Because skin is not sterile, your skin needs to be as free of germs as possible.  You can reduce the number of germs on your skin by washing with CHG (chlorahexidine gluconate) soap before surgery.  CHG is an antiseptic cleaner which kills germs and bonds with the skin to continue killing germs even after washing. Please DO NOT use if you have an allergy to CHG or antibacterial soaps.  If your skin becomes reddened/irritated stop using the CHG and inform your nurse when you arrive at Short Stay. Do not shave (including legs and underarms) for at least 48 hours prior to the first CHG shower.  You may shave your face/neck. Please follow these instructions carefully:  1.  Shower with CHG Soap the night before surgery and the  morning of Surgery.  2.  If you choose to wash your hair, wash your hair first as usual with your  normal  shampoo.  3.  After you shampoo, rinse your hair and body thoroughly to remove the  shampoo.  4.  Use CHG as you would any other liquid soap.  You can apply chg directly  to the skin and wash                       Gently with a scrungie or clean washcloth.  5.   Apply the CHG Soap to your body ONLY FROM THE NECK DOWN.   Do not use on face/ open                           Wound or open sores. Avoid contact with eyes, ears mouth and genitals (private parts).                       Wash face,  Genitals (private parts) with your normal soap.             6.  Wash thoroughly, paying special attention to the area where your surgery  will be performed.  7.  Thoroughly rinse your body with warm water from the neck down.  8.  DO NOT shower/wash with your normal soap after using and rinsing off  the CHG Soap.                9.  Pat yourself dry with a clean towel.            10.  Wear clean pajamas.            11.  Place clean sheets on your bed the night of your first shower and do not  sleep with pets. Day of Surgery : Do not apply any lotions/deodorants the morning of surgery.  Please wear clean clothes to the hospital/surgery center.  FAILURE TO FOLLOW THESE INSTRUCTIONS MAY RESULT IN THE CANCELLATION OF YOUR SURGERY PATIENT SIGNATURE_________________________________  NURSE SIGNATURE__________________________________  ________________________________________________________________________   Thomas Pham  An incentive spirometer is a tool that can help keep your lungs clear and active. This tool measures how well you are filling your lungs with each breath. Taking long deep breaths may help reverse or decrease the chance of developing breathing (pulmonary) problems (especially infection) following:  A long period of time when you are unable to move or be active. BEFORE THE PROCEDURE   If the spirometer includes an indicator to show your best effort, your nurse or respiratory therapist will set it to a desired goal.  If possible, sit up straight or lean slightly forward. Try not to slouch.  Hold the incentive spirometer in an upright position. INSTRUCTIONS FOR USE  1. Sit on the edge of your bed if possible, or sit up as far as you can in bed  or on a chair. 2. Hold the incentive spirometer in an upright position. 3. Breathe out normally. 4. Place the mouthpiece in your mouth and seal your lips tightly around it. 5. Breathe in slowly and as deeply as possible, raising the piston or the ball toward the top of the column. 6. Hold your breath for 3-5 seconds or for as long as possible. Allow the piston or ball to fall to the bottom of the column. 7. Remove the mouthpiece from your mouth and breathe out normally. 8. Rest for a few seconds and repeat Steps 1 through 7 at least 10 times every 1-2 hours when you are awake. Take your time and take a few normal breaths between deep breaths. 9. The spirometer may include an indicator to  show your best effort. Use the indicator as a goal to work toward during each repetition. 10. After each set of 10 deep breaths, practice coughing to be sure your lungs are clear. If you have an incision (the cut made at the time of surgery), support your incision when coughing by placing a pillow or rolled up towels firmly against it. Once you are able to get out of bed, walk around indoors and cough well. You may stop using the incentive spirometer when instructed by your caregiver.  RISKS AND COMPLICATIONS  Take your time so you do not get dizzy or light-headed.  If you are in pain, you may need to take or ask for pain medication before doing incentive spirometry. It is harder to take a deep breath if you are having pain. AFTER USE  Rest and breathe slowly and easily.  It can be helpful to keep track of a log of your progress. Your caregiver can provide you with a simple table to help with this. If you are using the spirometer at home, follow these instructions: [place] IF:   You are having difficultly using the spirometer.  You have trouble using the spirometer as often as instructed.  Your pain medication is not giving enough relief while using the spirometer.  You develop fever of 100.5  F (38.1 C) or higher. SEEK IMMEDIATE MEDICAL CARE IF:   You cough up bloody sputum that had not been present before.  You develop fever of 102 F (38.9 C) or greater.  You develop worsening pain at or near the incision site. MAKE SURE YOU:   Understand these instructions.  Will watch your condition.  Will get help right away if you are not doing well or get worse. Document Released: 06/15/2006 Document Revised: 04/27/2011 Document Reviewed: 08/16/2006 ExitCare Patient Information 2014 ExitCare, [place].   ________________________________________________________________________  WHAT IS A BLOOD TRANSFUSION? Blood Transfusion Information  A transfusion is the replacement of blood or some of its parts. Blood is made up of multiple cells which provide different functions.  Red blood cells carry oxygen and are used for blood loss replacement.  White blood cells fight against infection.  Platelets control bleeding.  Plasma helps clot blood.  Other blood products are available for specialized needs, such as hemophilia or other clotting disorders. BEFORE THE TRANSFUSION  Who gives blood for transfusions?   Healthy volunteers who are fully evaluated to make sure their blood is safe. This is blood bank blood. Transfusion therapy is the safest it has ever been in the practice of medicine. Before blood is taken from a donor, a complete history is taken to make sure that person has no history of diseases nor engages in risky social behavior (examples are intravenous drug use or sexual activity with multiple partners). The donor's travel history is screened to minimize risk of transmitting infections, such as malaria. The donated blood is tested for signs of infectious diseases, such as HIV and hepatitis. The blood is then tested to be sure it is compatible with you in order to minimize the chance of a transfusion reaction. If you or a relative donates blood, this is often done in  anticipation of surgery and is not appropriate for emergency situations. It takes many days to process the donated blood. RISKS AND COMPLICATIONS Although transfusion therapy is very safe and saves many lives, the main dangers of transfusion include:   Getting an infectious disease.  Developing a transfusion reaction. This is an allergic reaction  to something in the blood you were given. Every precaution is taken to prevent this. The decision to have a blood transfusion has been considered carefully by your caregiver before blood is given. Blood is not given unless the benefits outweigh the risks. AFTER THE TRANSFUSION  Right after receiving a blood transfusion, you will usually feel much better and more energetic. This is especially true if your red blood cells have gotten low (anemic). The transfusion raises the level of the red blood cells which carry oxygen, and this usually causes an energy increase.  The nurse administering the transfusion will monitor you carefully for complications. HOME CARE INSTRUCTIONS  No special instructions are needed after a transfusion. You may find your energy is better. Speak with your caregiver about any limitations on activity for underlying diseases you may have. SEEK MEDICAL CARE IF:   Your condition is not improving after your transfusion.  You develop redness or irritation at the intravenous (IV) site. SEEK IMMEDIATE MEDICAL CARE IF:  Any of the following symptoms occur over the next 12 hours:  Shaking chills.  You have a temperature by mouth above 102 F (38.9 C), not controlled by medicine.  Chest, back, or muscle pain.  People around you feel you are not acting correctly or are confused.  Shortness of breath or difficulty breathing.  Dizziness and fainting.  You get a rash or develop hives.  You have a decrease in urine output.  Your urine turns a dark color or changes to pink, red, or brown. Any of the following symptoms occur over  the next 10 days:  You have a temperature by mouth above 102 F (38.9 C), not controlled by medicine.  Shortness of breath.  Weakness after normal activity.  The white part of the eye turns yellow (jaundice).  You have a decrease in the amount of urine or are urinating less often.  Your urine turns a dark color or changes to pink, red, or brown. Document Released: 01/31/2000 Document Revised: 04/27/2011 Document Reviewed: 09/19/2007 [facility] Patient Information 2014 [place], [place].  _______________________________________________________________________

## 2016-02-28 ENCOUNTER — Encounter (HOSPITAL_COMMUNITY)
Admission: RE | Admit: 2016-02-28 | Discharge: 2016-02-28 | Disposition: A | Discharge status: Home / Self Care | Payer: BLUE CROSS/BLUE SHIELD | Source: Ambulatory Visit | Attending: Orthopedic Surgery | Admitting: Orthopedic Surgery

## 2016-02-28 ENCOUNTER — Encounter (HOSPITAL_COMMUNITY): Payer: Self-pay

## 2016-02-28 DIAGNOSIS — M1712 Unilateral primary osteoarthritis, left knee: Secondary | ICD-10-CM | POA: Diagnosis not present

## 2016-02-28 DIAGNOSIS — Z01812 Encounter for preprocedural laboratory examination: Secondary | ICD-10-CM | POA: Diagnosis present

## 2016-02-28 HISTORY — DX: Hypothyroidism, unspecified: E03.9

## 2016-02-28 HISTORY — DX: Sleep apnea, unspecified: G47.30

## 2016-02-28 HISTORY — DX: Personal history of other diseases of the digestive system: Z87.19

## 2016-02-28 LAB — CBC
HCT: 43.3 % (ref 39.0–52.0)
Hemoglobin: 14.3 g/dL (ref 13.0–17.0)
MCH: 27.5 pg (ref 26.0–34.0)
MCHC: 33 g/dL (ref 30.0–36.0)
MCV: 83.3 fL (ref 78.0–100.0)
Platelets: 112 10*3/uL — ABNORMAL LOW (ref 150–400)
RBC: 5.2 MIL/uL (ref 4.22–5.81)
RDW: 14.3 % (ref 11.5–15.5)
WBC: 6.2 10*3/uL (ref 4.0–10.5)

## 2016-02-28 LAB — URINALYSIS, ROUTINE W REFLEX MICROSCOPIC
Bacteria, UA: NONE SEEN
Bilirubin Urine: NEGATIVE
Glucose, UA: >=500 mg/dL — AB
Hgb urine dipstick: NEGATIVE
Ketones, ur: NEGATIVE mg/dL
Leukocytes, UA: NEGATIVE
Nitrite: NEGATIVE
Protein, ur: NEGATIVE mg/dL
Specific Gravity, Urine: 1.002 — ABNORMAL LOW (ref 1.005–1.030)
Squamous Epithelial / LPF: NONE SEEN
pH: 6 (ref 5.0–8.0)

## 2016-02-28 LAB — COMPREHENSIVE METABOLIC PANEL
ALT: 31 U/L (ref 17–63)
AST: 29 U/L (ref 15–41)
Albumin: 4.4 g/dL (ref 3.5–5.0)
Alkaline Phosphatase: 72 U/L (ref 38–126)
Anion gap: 9 (ref 5–15)
BUN: 19 mg/dL (ref 6–20)
CO2: 27 mmol/L (ref 22–32)
Calcium: 9 mg/dL (ref 8.9–10.3)
Chloride: 101 mmol/L (ref 101–111)
Creatinine, Ser: 1.33 mg/dL — ABNORMAL HIGH (ref 0.61–1.24)
GFR calc Af Amer: >60 mL/min (ref >60)
GFR calc non Af Amer: 57 mL/min — ABNORMAL LOW (ref >60)
Glucose, Bld: 182 mg/dL — ABNORMAL HIGH (ref 65–99)
Potassium: 3.5 mmol/L (ref 3.5–5.1)
Sodium: 137 mmol/L (ref 135–145)
Total Bilirubin: 0.7 mg/dL (ref 0.3–1.2)
Total Protein: 7.2 g/dL (ref 6.5–8.1)

## 2016-02-28 LAB — ABO/RH: ABO/RH(D): O POS

## 2016-02-28 LAB — PROTIME-INR
INR: 0.97
Prothrombin Time: 12.9 seconds (ref 11.4–15.2)

## 2016-02-28 LAB — SURGICAL PCR SCREEN
MRSA, PCR: NEGATIVE
Staphylococcus aureus: POSITIVE — AB

## 2016-02-28 LAB — APTT: aPTT: 36 seconds (ref 24–36)

## 2016-02-28 NOTE — Progress Notes (Signed)
CBC results faxed to Dr Wynelle Link pod by epic

## 2016-03-03 ENCOUNTER — Ambulatory Visit: Payer: Self-pay | Admitting: Orthopedic Surgery

## 2016-03-03 NOTE — H&P (Signed)
Thomas Pham DOB: 12-02-1955 Single / Language: Thomas Pham / Race: White Male Date of Admission:  03/04/2016 CC:  Left Knee Pain History of Present Illness The patient is a 61 year old male who comes in  for a preoperative History and Physical. The patient is scheduled for a left total knee arthroplasty to be performed by Dr. Dione Plover. Aluisio, MD at [facility] on 03-04-2016. The patient is a 62 year old male who presented for follow up of their knee. The patient is being followed for their bilateral knee pain and osteoarthritis. They are now month(s) out from Synvisc left knee. cortisone injection right knee. Symptoms reported include: pain, aching, throbbing and pain with weightbearing. The patient feels that they are doing poorly and report their pain level to be mild to moderate. Current treatment includes: bracing (left knee). The following medication has been used for pain control: Hydrocodone (and Celebrex for back issues. Glucosamine). The patient has reported symptom improvement with: Cortisone injections (cortisone did help the right knee.) while they have not gotten any relief of their symptoms with: viscosupplementation (pt. is unsure if Synvisc helped the left knee). Unfortunately the left knee has gotten progressively worse. It is hurting with most all activities. He is having increasing functional problems and knees giving out on him. He is not having any hip pain, lower extremity weakness or paresthesia with this. He is ready to get the knee fixed at this time. They have been treated conservatively in the past for the above stated problem and despite conservative measures, they continue to have progressive pain and severe functional limitations and dysfunction. They have failed non-operative management including home exercise, medications, and injections. It is felt that they would benefit from undergoing total joint replacement. Risks and benefits of the procedure have been discussed  with the patient and they elect to proceed with surgery. There are no active contraindications to surgery such as ongoing infection or rapidly progressive neurological disease.  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 father Osteoarthritis  father Congestive Heart Failure  Father. father Cerebrovascular Accident  grandmother mothers side Heart Disease  father and grandfather fathers side Diabetes Mellitus  father  Social History Tobacco use  Never smoker. never smoker Marital status  single Alcohol use  current drinker; drinks beer, wine and hard liquor; only occasionally per week Living situation  live with parents Illicit drug use  no Pain Contract  no Number of flights of stairs before winded  2-3 Current work status  working full time Children  0 Exercise  Exercises weekly; does running / walking Drug/Alcohol Rehab (Previously)  no Drug/Alcohol Rehab (Currently)  no  Medication History  Relistor (150MG  Tablet, Oral) Active. Glucosamine 1500 Complex (Oral) Specific strength unknown - Active. Levothyroxine Sodium (75MCG Tablet, Oral) Active. ZyrTEC Allergy (10MG  Capsule, Oral) Active. Ranitidine Acid Reducer (75MG  Tablet, Oral) Active. Calcium (500MG  Tablet, Oral) Active. (Os Cal) Vitamin D2 (Oral) Specific strength unknown - Active. (50,000mg  1 per week) Colcrys (0.6MG  Tablet, Oral) Active. Uloric (40MG  Tablet, Oral) Active. Flunisolide (29 MCG/ACT(0.025%) Solution,  Nasal) Active. (prn) Viagra (100MG  Tablet,  Oral) Active. (prn) NexIUM (40MG  Capsule DR, Oral) Active. Montelukast Sodium (10MG  Tablet, Oral) Active. Simvastatin (20MG  Tablet, Oral) Active. CeleBREX (200MG  Capsule, Oral daily) Active. Hydrocodone-Acetaminophen (5-325MG  Tablet, Oral) Active. Aspirin (81MG  Capsule, 1 (one) Oral) Active. Benicar (20MG  Tablet, Oral) Active.  Past Surgical History  Thyroidectomy; Subtotal  right Tonsillectomy  Arthroscopic Knee Surgery - Left     Review of Systems General Not Present- Chills, Fatigue, Fever, Memory Loss, Night Sweats, Weight Gain and Weight Loss. Skin Not Present- Eczema, Hives, Itching, Lesions and Rash. HEENT Not Present- Dentures, Double Vision, Headache, Hearing Loss, Tinnitus and Visual Loss. Respiratory Not Present- Allergies, Chronic Cough, Coughing up blood, Shortness of breath at rest and Shortness of breath with exertion. Cardiovascular Not Present- Chest Pain, Difficulty Breathing Lying Down, Murmur, Palpitations, Racing/skipping heartbeats and Swelling. Gastrointestinal Not Present- Abdominal Pain, Bloody Stool, Constipation, Diarrhea, Difficulty Swallowing, Heartburn, Jaundice, Loss of appetitie, Nausea and Vomiting. Male Genitourinary Not Present- Blood in Urine, Discharge, Flank Pain, Incontinence, Painful Urination, Urgency, Urinary frequency, Urinary Retention, Urinating at Night and Weak urinary stream. Musculoskeletal Present- Joint Pain. Not Present- Back Pain, Joint Swelling, Morning Stiffness, Muscle Pain, Muscle Weakness and Spasms. Neurological Not Present- Blackout spells, Difficulty with balance, Dizziness, Paralysis, Tremor and Weakness. Psychiatric Not Present- Insomnia.  Vitals Weight: 235 lb Height: 68in Weight was reported by patient. Height was reported by patient. Body Surface Area: 2.19 m Body Mass Index: 35.73 kg/m  Pulse: 72 (Regular)  BP: 132/82 (Sitting, Right Arm,  Standard)  Physical Exam  General Mental Status -Alert, cooperative and good historian. General Appearance-pleasant, Not in acute distress. Orientation-Oriented X3. Build & Nutrition-Well nourished and Well developed.  Head and Neck Head-normocephalic, atraumatic . Neck Global Assessment - supple, no bruit auscultated on the right, no bruit auscultated on the left.  Eye Pupil - Bilateral-PERR Motion - Bilateral-EOMI.  Chest and Lung Exam Auscultation Breath sounds - clear at anterior chest wall and clear at posterior chest wall. Adventitious sounds - No Adventitious sounds.  Cardiovascular Auscultation Rhythm - Regular rate and rhythm. Heart Sounds - S1 WNL and S2 WNL. Murmurs & Other Heart Sounds - Auscultation of the heart reveals - No Murmurs.  Abdomen Palpation/Percussion Tenderness - Abdomen is non-tender to palpation. Rigidity (guarding) - Abdomen is soft. Auscultation Auscultation of the abdomen reveals - Bowel sounds normal.  Male Genitourinary Note: Not done, not pertinent to present illness   Musculoskeletal Note: On exam, he is alert and oriented, in no apparent distress. His left hip shows normal range of motion with no discomfort. Left knee, no effusion with range of motion 5 to 130. Marked crepitus on range of motion, tender medial greater than lateral with no instability noted.  New radiographs taken today, AP both knees and lateral of the left showed that he has got bone-on-bone arthritis, medial and patellofemoral in that left knee.  Assessment & Plan Primary osteoarthritis of right knee (M17.11) Primary osteoarthritis of left knee (M17.12)  Note:Surgical Plans: Left Total Knee Replacement  Disposition: Home  PCP: [facility] - Patient has been seen preoperatively and felt to be stable for surgery.  IV TXA  Anesthesia Issues: None  Signed electronically by Ok Edwards, III PA-C

## 2016-03-04 ENCOUNTER — Inpatient Hospital Stay (HOSPITAL_COMMUNITY): Payer: BLUE CROSS/BLUE SHIELD | Admitting: Anesthesiology

## 2016-03-04 ENCOUNTER — Inpatient Hospital Stay (HOSPITAL_COMMUNITY)
Admission: RE | Admit: 2016-03-04 | Discharge: 2016-03-05 | DRG: 470 | Disposition: A | Discharge status: Home / Self Care | Payer: BLUE CROSS/BLUE SHIELD | Source: Ambulatory Visit | Attending: Orthopedic Surgery | Admitting: Orthopedic Surgery

## 2016-03-04 ENCOUNTER — Encounter (HOSPITAL_COMMUNITY)
Admission: RE | Disposition: A | Discharge status: Home / Self Care | Payer: Self-pay | Source: Ambulatory Visit | Attending: Orthopedic Surgery

## 2016-03-04 ENCOUNTER — Encounter (HOSPITAL_COMMUNITY): Payer: Self-pay | Admitting: *Deleted

## 2016-03-04 DIAGNOSIS — I10 Essential (primary) hypertension: Secondary | ICD-10-CM | POA: Diagnosis present

## 2016-03-04 DIAGNOSIS — E0590 Thyrotoxicosis, unspecified without thyrotoxic crisis or storm: Secondary | ICD-10-CM | POA: Diagnosis present

## 2016-03-04 DIAGNOSIS — M25762 Osteophyte, left knee: Secondary | ICD-10-CM | POA: Diagnosis present

## 2016-03-04 DIAGNOSIS — Z833 Family history of diabetes mellitus: Secondary | ICD-10-CM

## 2016-03-04 DIAGNOSIS — Z8249 Family history of ischemic heart disease and other diseases of the circulatory system: Secondary | ICD-10-CM

## 2016-03-04 DIAGNOSIS — M170 Bilateral primary osteoarthritis of knee: Principal | ICD-10-CM | POA: Diagnosis present

## 2016-03-04 DIAGNOSIS — E7800 Pure hypercholesterolemia, unspecified: Secondary | ICD-10-CM | POA: Diagnosis present

## 2016-03-04 DIAGNOSIS — Z79899 Other long term (current) drug therapy: Secondary | ICD-10-CM

## 2016-03-04 DIAGNOSIS — Z7982 Long term (current) use of aspirin: Secondary | ICD-10-CM

## 2016-03-04 DIAGNOSIS — M109 Gout, unspecified: Secondary | ICD-10-CM | POA: Diagnosis present

## 2016-03-04 DIAGNOSIS — M25562 Pain in left knee: Secondary | ICD-10-CM | POA: Diagnosis present

## 2016-03-04 DIAGNOSIS — K219 Gastro-esophageal reflux disease without esophagitis: Secondary | ICD-10-CM | POA: Diagnosis present

## 2016-03-04 DIAGNOSIS — M179 Osteoarthritis of knee, unspecified: Secondary | ICD-10-CM | POA: Diagnosis present

## 2016-03-04 DIAGNOSIS — M1710 Unilateral primary osteoarthritis, unspecified knee: Secondary | ICD-10-CM

## 2016-03-04 HISTORY — PX: TOTAL KNEE ARTHROPLASTY: SHX125

## 2016-03-04 LAB — TYPE AND SCREEN
ABO/RH(D): O POS
Antibody Screen: NEGATIVE

## 2016-03-04 SURGERY — ARTHROPLASTY, KNEE, TOTAL
Anesthesia: Spinal | Site: Knee | Laterality: Left

## 2016-03-04 MED ORDER — MEPERIDINE HCL 50 MG/ML IJ SOLN: 6.2500 mg | INTRAMUSCULAR | Status: DC | PRN

## 2016-03-04 MED ORDER — RIVAROXABAN 10 MG PO TABS
10.0000 mg | ORAL_TABLET | Freq: Every day | ORAL | Status: DC
  Administered 2016-03-05: 08:00:00 10 mg via ORAL
  Filled 2016-03-04: qty 1

## 2016-03-04 MED ORDER — FEBUXOSTAT 40 MG PO TABS
40.0000 mg | ORAL_TABLET | Freq: Every day | ORAL | Status: DC
  Administered 2016-03-05: 40 mg via ORAL
  Filled 2016-03-04: qty 1

## 2016-03-04 MED ORDER — MONTELUKAST SODIUM 10 MG PO TABS
10.0000 mg | ORAL_TABLET | Freq: Every morning | ORAL | Status: DC
  Administered 2016-03-05: 10 mg via ORAL
  Filled 2016-03-04: qty 1

## 2016-03-04 MED ORDER — FENTANYL CITRATE (PF) 100 MCG/2ML IJ SOLN
INTRAMUSCULAR | Status: AC
  Filled 2016-03-04: qty 2

## 2016-03-04 MED ORDER — NALOXEGOL OXALATE 25 MG PO TABS
25.0000 mg | ORAL_TABLET | Freq: Every day | ORAL | Status: DC
  Filled 2016-03-04: qty 1

## 2016-03-04 MED ORDER — PROPOFOL 10 MG/ML IV BOLUS
INTRAVENOUS | Status: AC
  Filled 2016-03-04: qty 40

## 2016-03-04 MED ORDER — PROPOFOL 10 MG/ML IV BOLUS
INTRAVENOUS | Status: AC
  Filled 2016-03-04: qty 20

## 2016-03-04 MED ORDER — PHENOL 1.4 % MT LIQD: 1.0000 | OROMUCOSAL | Status: DC | PRN

## 2016-03-04 MED ORDER — PROPOFOL 500 MG/50ML IV EMUL
INTRAVENOUS | Status: DC | PRN
  Administered 2016-03-04: 85 ug/kg/min via INTRAVENOUS

## 2016-03-04 MED ORDER — ONDANSETRON HCL 4 MG PO TABS: 4.0000 mg | ORAL_TABLET | Freq: Four times a day (QID) | ORAL | Status: DC | PRN

## 2016-03-04 MED ORDER — LACTATED RINGERS IV SOLN
INTRAVENOUS | Status: DC
  Administered 2016-03-04 (×3): via INTRAVENOUS

## 2016-03-04 MED ORDER — HYDROCHLOROTHIAZIDE 12.5 MG PO CAPS
12.5000 mg | ORAL_CAPSULE | Freq: Every day | ORAL | Status: DC
  Administered 2016-03-05: 12.5 mg via ORAL
  Filled 2016-03-04: qty 1

## 2016-03-04 MED ORDER — MENTHOL 3 MG MT LOZG: 1.0000 | LOZENGE | OROMUCOSAL | Status: DC | PRN

## 2016-03-04 MED ORDER — MIDAZOLAM HCL 2 MG/2ML IJ SOLN
INTRAMUSCULAR | Status: AC
  Filled 2016-03-04: qty 2

## 2016-03-04 MED ORDER — BUPIVACAINE HCL (PF) 0.75 % IJ SOLN
INTRAMUSCULAR | Status: DC | PRN
  Administered 2016-03-04: 2 mL via INTRATHECAL

## 2016-03-04 MED ORDER — METHOCARBAMOL 500 MG PO TABS
500.0000 mg | ORAL_TABLET | Freq: Four times a day (QID) | ORAL | Status: DC | PRN
  Administered 2016-03-04: 500 mg via ORAL
  Filled 2016-03-04: qty 1

## 2016-03-04 MED ORDER — TRAMADOL HCL 50 MG PO TABS: 50.0000 mg | ORAL_TABLET | Freq: Four times a day (QID) | ORAL | Status: DC | PRN

## 2016-03-04 MED ORDER — POLYETHYLENE GLYCOL 3350 17 G PO PACK: 17.0000 g | PACK | Freq: Every day | ORAL | Status: DC | PRN

## 2016-03-04 MED ORDER — ROPIVACAINE HCL 7.5 MG/ML IJ SOLN
INTRAMUSCULAR | Status: AC
  Filled 2016-03-04: qty 20

## 2016-03-04 MED ORDER — SODIUM CHLORIDE 0.9 % IV SOLN
1000.0000 mg | Freq: Once | INTRAVENOUS | Status: AC
  Administered 2016-03-04: 17:00:00 1000 mg via INTRAVENOUS
  Filled 2016-03-04: qty 10

## 2016-03-04 MED ORDER — METOCLOPRAMIDE HCL 5 MG/ML IJ SOLN: 5.0000 mg | Freq: Three times a day (TID) | INTRAMUSCULAR | Status: DC | PRN

## 2016-03-04 MED ORDER — PANTOPRAZOLE SODIUM 40 MG PO TBEC
40.0000 mg | DELAYED_RELEASE_TABLET | Freq: Every day | ORAL | Status: DC
  Administered 2016-03-05: 12:00:00 40 mg via ORAL

## 2016-03-04 MED ORDER — PROPOFOL 500 MG/50ML IV EMUL
INTRAVENOUS | Status: DC | PRN
  Administered 2016-03-04: 30 mg via INTRAVENOUS

## 2016-03-04 MED ORDER — DIPHENHYDRAMINE HCL 12.5 MG/5ML PO ELIX: 12.5000 mg | ORAL_SOLUTION | ORAL | Status: DC | PRN

## 2016-03-04 MED ORDER — DEXAMETHASONE SODIUM PHOSPHATE 10 MG/ML IJ SOLN
10.0000 mg | Freq: Once | INTRAMUSCULAR | Status: AC
  Administered 2016-03-04: 10 mg via INTRAVENOUS

## 2016-03-04 MED ORDER — METOCLOPRAMIDE HCL 5 MG PO TABS: 5.0000 mg | ORAL_TABLET | Freq: Three times a day (TID) | ORAL | Status: DC | PRN

## 2016-03-04 MED ORDER — MORPHINE SULFATE (PF) 2 MG/ML IV SOLN: 1.0000 mg | INTRAVENOUS | Status: DC | PRN

## 2016-03-04 MED ORDER — SODIUM CHLORIDE 0.9 % IV SOLN
INTRAVENOUS | Status: DC
  Administered 2016-03-04: 17:00:00 100 mL/h via INTRAVENOUS

## 2016-03-04 MED ORDER — BUPIVACAINE LIPOSOME 1.3 % IJ SUSP
20.0000 mL | Freq: Once | INTRAMUSCULAR | Status: DC
  Filled 2016-03-04: qty 20

## 2016-03-04 MED ORDER — METHYLNALTREXONE BROMIDE 150 MG PO TABS
150.0000 mg | ORAL_TABLET | Freq: Every day | ORAL | Status: DC
Start: 2016-03-05 — End: 2016-03-04

## 2016-03-04 MED ORDER — METHOCARBAMOL 1000 MG/10ML IJ SOLN
500.0000 mg | Freq: Four times a day (QID) | INTRAVENOUS | Status: DC | PRN
  Administered 2016-03-04: 500 mg via INTRAVENOUS
  Filled 2016-03-04: qty 550
  Filled 2016-03-04: qty 5

## 2016-03-04 MED ORDER — OLMESARTAN MEDOXOMIL-HCTZ 20-12.5 MG PO TABS: 1.0000 | ORAL_TABLET | Freq: Every day | ORAL | Status: DC

## 2016-03-04 MED ORDER — SODIUM CHLORIDE 0.9 % IJ SOLN
INTRAMUSCULAR | Status: AC
  Filled 2016-03-04: qty 50

## 2016-03-04 MED ORDER — ACETAMINOPHEN 650 MG RE SUPP: 650.0000 mg | Freq: Four times a day (QID) | RECTAL | Status: DC | PRN

## 2016-03-04 MED ORDER — FENTANYL CITRATE (PF) 100 MCG/2ML IJ SOLN: 25.0000 ug | INTRAMUSCULAR | Status: DC | PRN

## 2016-03-04 MED ORDER — FLEET ENEMA 7-19 GM/118ML RE ENEM: 1.0000 | ENEMA | Freq: Once | RECTAL | Status: DC | PRN

## 2016-03-04 MED ORDER — MIDAZOLAM HCL 2 MG/2ML IJ SOLN
2.0000 mg | Freq: Once | INTRAMUSCULAR | Status: AC
  Administered 2016-03-04: 2 mg via INTRAVENOUS

## 2016-03-04 MED ORDER — CHLORHEXIDINE GLUCONATE 4 % EX LIQD: 60.0000 mL | Freq: Once | CUTANEOUS | Status: DC

## 2016-03-04 MED ORDER — FAMOTIDINE 20 MG PO TABS
20.0000 mg | ORAL_TABLET | Freq: Two times a day (BID) | ORAL | Status: DC
  Filled 2016-03-04 (×2): qty 1

## 2016-03-04 MED ORDER — ONDANSETRON HCL 4 MG/2ML IJ SOLN
INTRAMUSCULAR | Status: DC | PRN
  Administered 2016-03-04: 4 mg via INTRAVENOUS

## 2016-03-04 MED ORDER — ROPIVACAINE HCL 7.5 MG/ML IJ SOLN
INTRAMUSCULAR | Status: DC | PRN
  Administered 2016-03-04: 20 mL via PERINEURAL

## 2016-03-04 MED ORDER — ACETAMINOPHEN 10 MG/ML IV SOLN
INTRAVENOUS | Status: AC
  Filled 2016-03-04: qty 100

## 2016-03-04 MED ORDER — IRBESARTAN 150 MG PO TABS
150.0000 mg | ORAL_TABLET | Freq: Every day | ORAL | Status: DC
  Administered 2016-03-05: 12:00:00 150 mg via ORAL
  Filled 2016-03-04: qty 1

## 2016-03-04 MED ORDER — DOCUSATE SODIUM 100 MG PO CAPS
100.0000 mg | ORAL_CAPSULE | Freq: Two times a day (BID) | ORAL | Status: DC
  Administered 2016-03-04 – 2016-03-05 (×2): 100 mg via ORAL
  Filled 2016-03-04 (×2): qty 1

## 2016-03-04 MED ORDER — DEXAMETHASONE SODIUM PHOSPHATE 10 MG/ML IJ SOLN
10.0000 mg | Freq: Once | INTRAMUSCULAR | Status: AC
Start: 2016-03-05 — End: 2016-03-05
  Administered 2016-03-05: 10 mg via INTRAVENOUS
  Filled 2016-03-04: qty 1

## 2016-03-04 MED ORDER — CEFAZOLIN SODIUM-DEXTROSE 2-4 GM/100ML-% IV SOLN
INTRAVENOUS | Status: AC
  Filled 2016-03-04: qty 100

## 2016-03-04 MED ORDER — FENTANYL CITRATE (PF) 100 MCG/2ML IJ SOLN
INTRAMUSCULAR | Status: DC | PRN
  Administered 2016-03-04: 50 ug via INTRAVENOUS

## 2016-03-04 MED ORDER — SODIUM CHLORIDE 0.9 % IJ SOLN
INTRAMUSCULAR | Status: DC | PRN
  Administered 2016-03-04: 50 mL

## 2016-03-04 MED ORDER — SODIUM CHLORIDE 0.9 % IR SOLN
Status: DC | PRN
  Administered 2016-03-04: 1000 mL

## 2016-03-04 MED ORDER — FENTANYL CITRATE (PF) 100 MCG/2ML IJ SOLN
100.0000 ug | Freq: Once | INTRAMUSCULAR | Status: AC
  Administered 2016-03-04: 100 ug via INTRAVENOUS

## 2016-03-04 MED ORDER — ACETAMINOPHEN 325 MG PO TABS: 650.0000 mg | ORAL_TABLET | Freq: Four times a day (QID) | ORAL | Status: DC | PRN

## 2016-03-04 MED ORDER — BUPIVACAINE IN DEXTROSE 0.75-8.25 % IT SOLN: INTRATHECAL | Status: DC | PRN

## 2016-03-04 MED ORDER — MIDAZOLAM HCL 5 MG/5ML IJ SOLN
INTRAMUSCULAR | Status: DC | PRN
  Administered 2016-03-04: 1 mg via INTRAVENOUS

## 2016-03-04 MED ORDER — ACETAMINOPHEN 500 MG PO TABS
1000.0000 mg | ORAL_TABLET | Freq: Four times a day (QID) | ORAL | Status: DC
  Administered 2016-03-04 – 2016-03-05 (×3): 1000 mg via ORAL
  Filled 2016-03-04 (×3): qty 2

## 2016-03-04 MED ORDER — PHENYLEPHRINE 40 MCG/ML (10ML) SYRINGE FOR IV PUSH (FOR BLOOD PRESSURE SUPPORT)
PREFILLED_SYRINGE | INTRAVENOUS | Status: AC
  Filled 2016-03-04: qty 10

## 2016-03-04 MED ORDER — ACETAMINOPHEN 10 MG/ML IV SOLN
1000.0000 mg | Freq: Once | INTRAVENOUS | Status: AC
  Administered 2016-03-04: 1000 mg via INTRAVENOUS
  Filled 2016-03-04: qty 100

## 2016-03-04 MED ORDER — TRANEXAMIC ACID 1000 MG/10ML IV SOLN
1000.0000 mg | INTRAVENOUS | Status: AC
  Administered 2016-03-04: 1000 mg via INTRAVENOUS
  Filled 2016-03-04: qty 10

## 2016-03-04 MED ORDER — COLCHICINE 0.6 MG PO TABS
0.6000 mg | ORAL_TABLET | Freq: Every morning | ORAL | Status: DC
Start: 2016-03-05 — End: 2016-03-05
  Administered 2016-03-05: 12:00:00 0.6 mg via ORAL
  Filled 2016-03-04: qty 1

## 2016-03-04 MED ORDER — SIMVASTATIN 20 MG PO TABS
40.0000 mg | ORAL_TABLET | Freq: Every day | ORAL | Status: DC
  Administered 2016-03-04: 40 mg via ORAL
  Filled 2016-03-04: qty 2

## 2016-03-04 MED ORDER — LACTATED RINGERS IV SOLN: INTRAVENOUS | Status: DC

## 2016-03-04 MED ORDER — LEVOTHYROXINE SODIUM 75 MCG PO TABS
75.0000 ug | ORAL_TABLET | Freq: Every day | ORAL | Status: DC
  Administered 2016-03-05: 75 ug via ORAL
  Filled 2016-03-04: qty 1

## 2016-03-04 MED ORDER — ONDANSETRON HCL 4 MG/2ML IJ SOLN: 4.0000 mg | Freq: Four times a day (QID) | INTRAMUSCULAR | Status: DC | PRN

## 2016-03-04 MED ORDER — METOCLOPRAMIDE HCL 5 MG/ML IJ SOLN: 10.0000 mg | Freq: Once | INTRAMUSCULAR | Status: DC | PRN

## 2016-03-04 MED ORDER — 0.9 % SODIUM CHLORIDE (POUR BTL) OPTIME
TOPICAL | Status: DC | PRN
  Administered 2016-03-04: 1000 mL

## 2016-03-04 MED ORDER — BUPIVACAINE LIPOSOME 1.3 % IJ SUSP
INTRAMUSCULAR | Status: DC | PRN
  Administered 2016-03-04: 20 mL

## 2016-03-04 MED ORDER — LORATADINE 10 MG PO TABS
10.0000 mg | ORAL_TABLET | Freq: Every day | ORAL | Status: DC
  Administered 2016-03-04: 22:00:00 10 mg via ORAL
  Filled 2016-03-04: qty 1

## 2016-03-04 MED ORDER — BISACODYL 10 MG RE SUPP: 10.0000 mg | Freq: Every day | RECTAL | Status: DC | PRN

## 2016-03-04 MED ORDER — PHENYLEPHRINE HCL 10 MG/ML IJ SOLN
INTRAVENOUS | Status: DC | PRN
  Administered 2016-03-04: 20 ug/min via INTRAVENOUS

## 2016-03-04 MED ORDER — OXYCODONE HCL 5 MG PO TABS
5.0000 mg | ORAL_TABLET | ORAL | Status: DC | PRN
  Administered 2016-03-04 (×2): 5 mg via ORAL
  Administered 2016-03-05 (×4): 10 mg via ORAL
  Filled 2016-03-04 (×2): qty 2
  Filled 2016-03-04 (×2): qty 1
  Filled 2016-03-04: qty 2
  Filled 2016-03-04 (×2): qty 1

## 2016-03-04 MED ORDER — CEFAZOLIN SODIUM-DEXTROSE 2-4 GM/100ML-% IV SOLN
2.0000 g | Freq: Four times a day (QID) | INTRAVENOUS | Status: AC
  Administered 2016-03-04 – 2016-03-05 (×2): 2 g via INTRAVENOUS
  Filled 2016-03-04 (×2): qty 100

## 2016-03-04 MED ORDER — CEFAZOLIN SODIUM-DEXTROSE 2-4 GM/100ML-% IV SOLN
2.0000 g | INTRAVENOUS | Status: AC
  Administered 2016-03-04: 2 g via INTRAVENOUS
  Filled 2016-03-04: qty 100

## 2016-03-04 SURGICAL SUPPLY — 50 items
BAG DECANTER FOR FLEXI CONT (MISCELLANEOUS) ×1 IMPLANT
BAG SPEC THK2 15X12 ZIP CLS (MISCELLANEOUS) ×1
BAG ZIPLOCK 12X15 (MISCELLANEOUS) ×3 IMPLANT
BANDAGE ACE 6X5 VEL STRL LF (GAUZE/BANDAGES/DRESSINGS) ×3 IMPLANT
BLADE SAG 18X100X1.27 (BLADE) ×3 IMPLANT
BLADE SAW SGTL 11.0X1.19X90.0M (BLADE) ×3 IMPLANT
BOWL SMART MIX CTS (DISPOSABLE) ×3 IMPLANT
CAPT KNEE TOTAL 3 ATTUNE ×2 IMPLANT
CEMENT HV SMART SET (Cement) ×6 IMPLANT
CLOSURE WOUND 1/2 X4 (GAUZE/BANDAGES/DRESSINGS) ×1
CLOTH BEACON ORANGE TIMEOUT ST (SAFETY) ×3 IMPLANT
CUFF TOURN SGL QUICK 34 (TOURNIQUET CUFF) ×3
CUFF TRNQT CYL 34X4X40X1 (TOURNIQUET CUFF) ×1 IMPLANT
DECANTER SPIKE VIAL GLASS SM (MISCELLANEOUS) ×3 IMPLANT
DRAPE U-SHAPE 47X51 STRL (DRAPES) ×3 IMPLANT
DRSG ADAPTIC 3X8 NADH LF (GAUZE/BANDAGES/DRESSINGS) ×3 IMPLANT
DRSG PAD ABDOMINAL 8X10 ST (GAUZE/BANDAGES/DRESSINGS) ×3 IMPLANT
DURAPREP 26ML APPLICATOR (WOUND CARE) ×3 IMPLANT
ELECT REM PT RETURN 9FT ADLT (ELECTROSURGICAL) ×3
ELECTRODE REM PT RTRN 9FT ADLT (ELECTROSURGICAL) ×1 IMPLANT
EVACUATOR 1/8 PVC DRAIN (DRAIN) ×3 IMPLANT
GAUZE SPONGE 4X4 12PLY STRL (GAUZE/BANDAGES/DRESSINGS) ×3 IMPLANT
GLOVE BIO SURGEON STRL SZ7.5 (GLOVE) IMPLANT
GLOVE BIO SURGEON STRL SZ8 (GLOVE) ×3 IMPLANT
GLOVE BIOGEL PI IND STRL 6.5 (GLOVE) IMPLANT
GLOVE BIOGEL PI IND STRL 8 (GLOVE) ×1 IMPLANT
GLOVE BIOGEL PI INDICATOR 6.5 (GLOVE)
GLOVE BIOGEL PI INDICATOR 8 (GLOVE) ×10
GLOVE SURG SS PI 6.5 STRL IVOR (GLOVE) IMPLANT
GOWN STRL REUS W/TWL LRG LVL3 (GOWN DISPOSABLE) ×7 IMPLANT
GOWN STRL REUS W/TWL XL LVL3 (GOWN DISPOSABLE) ×2 IMPLANT
HANDPIECE INTERPULSE COAX TIP (DISPOSABLE) ×3
IMMOBILIZER KNEE 20 (SOFTGOODS) ×3
IMMOBILIZER KNEE 20 THIGH 36 (SOFTGOODS) ×1 IMPLANT
MANIFOLD NEPTUNE II (INSTRUMENTS) ×3 IMPLANT
NS IRRIG 1000ML POUR BTL (IV SOLUTION) ×3 IMPLANT
PACK TOTAL KNEE CUSTOM (KITS) ×3 IMPLANT
PADDING CAST COTTON 6X4 STRL (CAST SUPPLIES) ×5 IMPLANT
POSITIONER SURGICAL ARM (MISCELLANEOUS) ×3 IMPLANT
SET HNDPC FAN SPRY TIP SCT (DISPOSABLE) ×1 IMPLANT
STRIP CLOSURE SKIN 1/2X4 (GAUZE/BANDAGES/DRESSINGS) ×3 IMPLANT
SUT MNCRL AB 4-0 PS2 18 (SUTURE) ×3 IMPLANT
SUT VIC AB 2-0 CT1 27 (SUTURE) ×9
SUT VIC AB 2-0 CT1 TAPERPNT 27 (SUTURE) ×3 IMPLANT
SUT VLOC 180 0 24IN GS25 (SUTURE) ×3 IMPLANT
SYR 50ML LL SCALE MARK (SYRINGE) ×3 IMPLANT
TRAY FOLEY W/METER SILVER 16FR (SET/KITS/TRAYS/PACK) ×3 IMPLANT
WATER STERILE IRR 1500ML POUR (IV SOLUTION) ×3 IMPLANT
WRAP KNEE MAXI GEL POST OP (GAUZE/BANDAGES/DRESSINGS) ×3 IMPLANT
YANKAUER SUCT BULB TIP 10FT TU (MISCELLANEOUS) ×3 IMPLANT

## 2016-03-04 NOTE — Anesthesia Postprocedure Evaluation (Signed)
Anesthesia Post Note  Patient: Thomas Pham  Procedure(s) Performed: Procedure(s) (LRB): LEFT TOTAL KNEE ARTHROPLASTY (Left)  Patient location during evaluation: PACU Anesthesia Type: Spinal and Regional Level of consciousness: awake and alert Pain management: pain level controlled Vital Signs Assessment: post-procedure vital signs reviewed and stable Respiratory status: spontaneous breathing and respiratory function stable Cardiovascular status: blood pressure returned to baseline and stable Postop Assessment: no headache, no backache and spinal receding Anesthetic complications: no       Last Vitals:  Vitals:   03/04/16 1140 03/04/16 1257  BP: (!) 145/92 (P) 131/80  Pulse: 88 87  Resp: 18 (P) 15  Temp:  36.6 C    Last Pain:  Vitals:   03/04/16 0953  TempSrc:   PainSc: 3                  Montez Hageman

## 2016-03-04 NOTE — Progress Notes (Signed)
AssistedDr. Carignan with left, ultrasound guided, adductor canal block. Side rails up, monitors on throughout procedure. See vital signs in flow sheet. Tolerated Procedure well.  

## 2016-03-04 NOTE — H&P (View-Only) (Signed)
Thomas Pham DOB: 05/24/1955 Single / Language: [name] / Race: White Male Date of Admission:  03/04/2016 CC:  Left Knee Pain History of Present Illness The patient is a 61 year old male who comes in  for a preoperative History and Physical. The patient is scheduled for a left total knee arthroplasty to be performed by Dr. Dione Plover. Aluisio, MD at [facility] on 03-04-2016. The patient is a 61 year old male who presented for follow up of their knee. The patient is being followed for their bilateral knee pain and osteoarthritis. They are now month(s) out from Synvisc left knee. cortisone injection right knee. Symptoms reported include: pain, aching, throbbing and pain with weightbearing. The patient feels that they are doing poorly and report their pain level to be mild to moderate. Current treatment includes: bracing (left knee). The following medication has been used for pain control: Hydrocodone (and Celebrex for back issues. Glucosamine). The patient has reported symptom improvement with: Cortisone injections (cortisone did help the right knee.) while they have not gotten any relief of their symptoms with: viscosupplementation (pt. is unsure if Synvisc helped the left knee). Unfortunately the left knee has gotten progressively worse. It is hurting with most all activities. He is having increasing functional problems and knees giving out on him. He is not having any hip pain, lower extremity weakness or paresthesia with this. He is ready to get the knee fixed at this time. They have been treated conservatively in the past for the above stated problem and despite conservative measures, they continue to have progressive pain and severe functional limitations and dysfunction. They have failed non-operative management including home exercise, medications, and injections. It is felt that they would benefit from undergoing total joint replacement. Risks and benefits of the procedure have been discussed  with the patient and they elect to proceed with surgery. There are no active contraindications to surgery such as ongoing infection or rapidly progressive neurological disease.  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 father Osteoarthritis  father Congestive Heart Failure  Father. father Cerebrovascular Accident  grandmother mothers side Heart Disease  father and grandfather fathers side Diabetes Mellitus  father  Social History Tobacco use  Never smoker. never smoker Marital status  single Alcohol use  current drinker; drinks beer, wine and hard liquor; only occasionally per week Living situation  live with parents Illicit drug use  no Pain Contract  no Number of flights of stairs before winded  2-3 Current work status  working full time Children  0 Exercise  Exercises weekly; does running / walking Drug/Alcohol Rehab (Previously)  no Drug/Alcohol Rehab (Currently)  no  Medication History  Relistor (150MG  Tablet, Oral) Active. Glucosamine 1500 Complex (Oral) Specific strength unknown - Active. Levothyroxine Sodium (75MCG Tablet, Oral) Active. ZyrTEC Allergy (10MG  Capsule, Oral) Active. Ranitidine Acid Reducer (75MG  Tablet, Oral) Active. Calcium (500MG  Tablet, Oral) Active. (Os Cal) Vitamin D2 (Oral) Specific strength unknown - Active. (50,000mg  1 per week) Colcrys (0.6MG  Tablet, Oral) Active. Uloric (40MG  Tablet, Oral) Active. Flunisolide (29 MCG/ACT(0.025%) Solution,  Nasal) Active. (prn) Viagra (100MG  Tablet,  Oral) Active. (prn) NexIUM (40MG  Capsule DR, Oral) Active. Montelukast Sodium (10MG  Tablet, Oral) Active. Simvastatin (20MG  Tablet, Oral) Active. CeleBREX (200MG  Capsule, Oral daily) Active. Hydrocodone-Acetaminophen (5-325MG  Tablet, Oral) Active. Aspirin (81MG  Capsule, 1 (one) Oral) Active. Benicar (20MG  Tablet, Oral) Active.  Past Surgical History  Thyroidectomy; Subtotal  right Tonsillectomy  Arthroscopic Knee Surgery - Left     Review of Systems General Not Present- Chills, Fatigue, Fever, Memory Loss, Night Sweats, Weight Gain and Weight Loss. Skin Not Present- Eczema, Hives, Itching, Lesions and Rash. HEENT Not Present- Dentures, Double Vision, Headache, Hearing Loss, Tinnitus and Visual Loss. Respiratory Not Present- Allergies, Chronic Cough, Coughing up blood, Shortness of breath at rest and Shortness of breath with exertion. Cardiovascular Not Present- Chest Pain, Difficulty Breathing Lying Down, Murmur, Palpitations, Racing/skipping heartbeats and Swelling. Gastrointestinal Not Present- Abdominal Pain, Bloody Stool, Constipation, Diarrhea, Difficulty Swallowing, Heartburn, Jaundice, Loss of appetitie, Nausea and Vomiting. Male Genitourinary Not Present- Blood in Urine, Discharge, Flank Pain, Incontinence, Painful Urination, Urgency, Urinary frequency, Urinary Retention, Urinating at Night and Weak urinary stream. Musculoskeletal Present- Joint Pain. Not Present- Back Pain, Joint Swelling, Morning Stiffness, Muscle Pain, Muscle Weakness and Spasms. Neurological Not Present- Blackout spells, Difficulty with balance, Dizziness, Paralysis, Tremor and Weakness. Psychiatric Not Present- Insomnia.  Vitals Weight: 235 lb Height: 68in Weight was reported by patient. Height was reported by patient. Body Surface Area: 2.19 m Body Mass Index: 35.73 kg/m  Pulse: 72 (Regular)  BP: 132/82 (Sitting, Right Arm,  Standard)  Physical Exam  General Mental Status -Alert, cooperative and good historian. General Appearance-pleasant, Not in acute distress. Orientation-Oriented X3. Build & Nutrition-Well nourished and Well developed.  Head and Neck Head-normocephalic, atraumatic . Neck Global Assessment - supple, no bruit auscultated on the right, no bruit auscultated on the left.  Eye Pupil - Bilateral-PERR Motion - Bilateral-EOMI.  Chest and Lung Exam Auscultation Breath sounds - clear at anterior chest wall and clear at posterior chest wall. Adventitious sounds - No Adventitious sounds.  Cardiovascular Auscultation Rhythm - Regular rate and rhythm. Heart Sounds - S1 WNL and S2 WNL. Murmurs & Other Heart Sounds - Auscultation of the heart reveals - No Murmurs.  Abdomen Palpation/Percussion Tenderness - Abdomen is non-tender to palpation. Rigidity (guarding) - Abdomen is soft. Auscultation Auscultation of the abdomen reveals - Bowel sounds normal.  Male Genitourinary Note: Not done, not pertinent to present illness   Musculoskeletal Note: On exam, he is alert and oriented, in no apparent distress. His left hip shows normal range of motion with no discomfort. Left knee, no effusion with range of motion 5 to 130. Marked crepitus on range of motion, tender medial greater than lateral with no instability noted.  New radiographs taken today, AP both knees and lateral of the left showed that he has got bone-on-bone arthritis, medial and patellofemoral in that left knee.  Assessment & Plan Primary osteoarthritis of right knee (M17.11) Primary osteoarthritis of left knee (M17.12)  Note:Surgical Plans: Left Total Knee Replacement  Disposition: Home  PCP: [facility] - Patient has been seen preoperatively and felt to be stable for surgery.  IV TXA  Anesthesia Issues: None  Signed electronically by Ok Edwards, III PA-C

## 2016-03-04 NOTE — Anesthesia Procedure Notes (Signed)
Spinal  Patient location during procedure: OR Staffing Anesthesiologist: Montez Hageman Performed: anesthesiologist  Preanesthetic Checklist Completed: patient identified, site marked, surgical consent, pre-op evaluation, timeout performed, IV checked, risks and benefits discussed and monitors and equipment checked Spinal Block Patient position: sitting Prep: ChloraPrep Patient monitoring: heart rate, continuous pulse ox and blood pressure Approach: midline Location: L3-4 Injection technique: single-shot Needle Needle type: Sprotte  Needle gauge: 24 G Needle length: 9 cm Additional Notes Expiration date of kit checked and confirmed. Patient tolerated procedure well, without complications.

## 2016-03-04 NOTE — Transfer of Care (Signed)
Immediate Anesthesia Transfer of Care Note  Patient: Thomas Pham  Procedure(s) Performed: Procedure(s): LEFT TOTAL KNEE ARTHROPLASTY (Left)  Patient Location: PACU  Anesthesia Type:Spinal  Level of Consciousness:  sedated, patient cooperative and responds to stimulation  Airway & Oxygen Therapy:Patient Spontanous Breathing and Patient connected to face mask oxgen  Post-op Assessment:  Report given to PACU RN and Post -op Vital signs reviewed and stable  Post vital signs:  Reviewed and stable  Last Vitals:  Vitals:   03/04/16 1139 03/04/16 1140  BP:  (!) 145/92  Pulse: 88 88  Resp: 19 18  Temp:      Complications: No apparent anesthesia complications

## 2016-03-04 NOTE — Anesthesia Procedure Notes (Signed)
Anesthesia Regional Block:  Adductor canal block  Pre-Anesthetic Checklist: ,, timeout performed, Correct Patient, Correct Site, Correct Laterality, Correct Procedure, Correct Position, site marked, Risks and benefits discussed,  Surgical consent,  Pre-op evaluation,  At surgeon's request and post-op pain management  Laterality: Left and Lower  Prep: Maximum Sterile Barrier Precautions used, chloraprep       Needles:  Injection technique: Single-shot  Needle Type: Echogenic Stimulator Needle     Needle Length: 10cm 10 cm Needle Gauge: 21 G    Additional Needles:  Procedures: ultrasound guided (picture in chart) Adductor canal block Narrative:  Start time: 03/04/2016 11:29 AM End time: 03/04/2016 11:39 AM Injection made incrementally with aspirations every 5 mL.  Performed by: Personally  Anesthesiologist: Montez Hageman  Additional Notes: Risks, benefits and alternative to block explained extensively.  Patient tolerated procedure well, without complications.

## 2016-03-04 NOTE — Interval H&P Note (Signed)
History and Physical Interval Note:  03/04/2016 11:58 AM  Thomas Pham  has presented today for surgery, with the diagnosis of LEFT KNEE OA  The various methods of treatment have been discussed with the patient and family. After consideration of risks, benefits and other options for treatment, the patient has consented to  Procedure(s): LEFT TOTAL KNEE ARTHROPLASTY (Left) as a surgical intervention .  The patient's history has been reviewed, patient examined, no change in status, stable for surgery.  I have reviewed the patient's chart and labs.  Questions were answered to the patient's satisfaction.     Gearlean Alf

## 2016-03-04 NOTE — Anesthesia Preprocedure Evaluation (Signed)
Anesthesia Evaluation  Patient identified by MRN, date of birth, ID band Patient awake    Reviewed: Allergy & Precautions, NPO status , Patient's Chart, lab work & pertinent test results  Airway Mallampati: II  TM Distance: >3 FB Neck ROM: Full    Dental no notable dental hx.    Pulmonary neg pulmonary ROS, sleep apnea ,    Pulmonary exam normal breath sounds clear to auscultation       Cardiovascular hypertension, Pt. on medications Normal cardiovascular exam Rhythm:Regular Rate:Normal     Neuro/Psych negative neurological ROS  negative psych ROS   GI/Hepatic Neg liver ROS, hiatal hernia, GERD  Controlled,  Endo/Other  negative endocrine ROS  Renal/GU negative Renal ROS  negative genitourinary   Musculoskeletal negative musculoskeletal ROS (+)   Abdominal   Peds negative pediatric ROS (+)  Hematology negative hematology ROS (+)   Anesthesia Other Findings   Reproductive/Obstetrics negative OB ROS                             Anesthesia Physical Anesthesia Plan  ASA: III  Anesthesia Plan: Spinal   Post-op Pain Management:  Regional for Post-op pain   Induction:   Airway Management Planned: Simple Face Mask  Additional Equipment:   Intra-op Plan:   Post-operative Plan:   Informed Consent: I have reviewed the patients History and Physical, chart, labs and discussed the procedure including the risks, benefits and alternatives for the proposed anesthesia with the patient or authorized representative who has indicated his/her understanding and acceptance.   Dental advisory given  Plan Discussed with: CRNA  Anesthesia Plan Comments: (Adductor canal block)        Anesthesia Quick Evaluation

## 2016-03-04 NOTE — Op Note (Signed)
OPERATIVE REPORT-TOTAL KNEE ARTHROPLASTY   Pre-operative diagnosis- Osteoarthritis  Left knee(s)  Post-operative diagnosis- Osteoarthritis Left knee(s)  Procedure-  Left  Total Knee Arthroplasty  Surgeon- Thomas Plover. Drisana Schweickert, MD  Assistant- Arlee Muslim, Pa-C   Anesthesia-  Adductor canal block and Spinal  EBL-* No blood loss amount entered *   Drains Hemovac  Tourniquet time- 34 minutes @ [id number] mm Hg  Complications- None  Condition-PACU - hemodynamically stable.   Brief Clinical Note   Thomas Pham is a 61 y.o. year old male with end stage OA of his left knee with progressively worsening pain and dysfunction. He has constant pain, with activity and at rest and significant functional deficits with difficulties even with ADLs. He has had extensive non-op management including analgesics, injections of cortisone and viscosupplements, and home exercise program, but remains in significant pain with significant dysfunction. Radiographs show bone on bone arthritis medial and patellofemoral. He presents now for left Total Knee Arthroplasty.     Procedure in detail---   The patient is brought into the operating room and positioned supine on the operating table. After successful administration of  Adductor canal block and Spinal,   a tourniquet is placed high on the  Left thigh(s) and the lower extremity is prepped and draped in the usual sterile fashion. Time out is performed by the operating team and then the  Left lower extremity is wrapped in Esmarch, knee flexed and the tourniquet inflated to 300 mmHg.       A midline incision is made with a ten blade through the subcutaneous tissue to the level of the extensor mechanism. A fresh blade is used to make a medial parapatellar arthrotomy. Soft tissue over the proximal medial tibia is subperiosteally elevated to the joint line with a knife and into the semimembranosus bursa with a Cobb elevator. Soft tissue over the proximal lateral tibia is  elevated with attention being paid to avoiding the patellar tendon on the tibial tubercle. The patella is everted, knee flexed 90 degrees and the ACL and PCL are removed. Findings are bone on bone medial and patellofemoral with large global osteophytes.        The drill is used to create a starting hole in the distal femur and the canal is thoroughly irrigated with sterile saline to remove the fatty contents. The 5 degree Left  valgus alignment guide is placed into the femoral canal and the distal femoral cutting block is pinned to remove 9 mm off the distal femur. Resection is made with an oscillating saw.      The tibia is subluxed forward and the menisci are removed. The extramedullary alignment guide is placed referencing proximally at the medial aspect of the tibial tubercle and distally along the second metatarsal axis and tibial crest. The block is pinned to remove 32mm off the more deficient medial  side. Resection is made with an oscillating saw. Size 7is the most appropriate size for the tibia and the proximal tibia is prepared with the modular drill and keel punch for that size.      The femoral sizing guide is placed and size 7 is most appropriate. Rotation is marked off the epicondylar axis and confirmed by creating a rectangular flexion gap at 90 degrees. The size 7 cutting block is pinned in this rotation and the anterior, posterior and chamfer cuts are made with the oscillating saw. The intercondylar block is then placed and that cut is made.      Trial size  7 tibial component, trial size 7 posterior stabilized femur and a 8  mm posterior stabilized rotating platform insert trial is placed. Full extension is achieved with excellent varus/valgus and anterior/posterior balance throughout full range of motion. The patella is everted and thickness measured to be 25  mm. Free hand resection is taken to 15 mm, a 38 template is placed, lug holes are drilled, trial patella is placed, and it tracks  normally. Osteophytes are removed off the posterior femur with the trial in place. All trials are removed and the cut bone surfaces prepared with pulsatile lavage. Cement is mixed and once ready for implantation, the size 7 tibial implant, size  7 posterior stabilized femoral component, and the size 38 patella are cemented in place and the patella is held with the clamp. The trial insert is placed and the knee held in full extension. The Exparel (20 ml mixed with 30 ml saline) and .25% Bupivicaine, are injected into the extensor mechanism, posterior capsule, medial and lateral gutters and subcutaneous tissues.  All extruded cement is removed and once the cement is hard the permanent 8 mm posterior stabilized rotating platform insert is placed into the tibial tray.      The wound is copiously irrigated with saline solution and the extensor mechanism closed over a hemovac drain with #1 V-loc suture. The tourniquet is released for a total tourniquet time of 34  minutes. Flexion against gravity is 140 degrees and the patella tracks normally. Subcutaneous tissue is closed with 2.0 vicryl and subcuticular with running 4.0 Monocryl. The incision is cleaned and dried and steri-strips and a bulky sterile dressing are applied. The limb is placed into a knee immobilizer and the patient is awakened and transported to recovery in stable condition.      Please note that a surgical assistant was a medical necessity for this procedure in order to perform it in a safe and expeditious manner. Surgical assistant was necessary to retract the ligaments and vital neurovascular structures to prevent injury to them and also necessary for proper positioning of the limb to allow for anatomic placement of the prosthesis.   Thomas Plover Thomas Brumbaugh, MD    03/04/2016, 1:27 PM

## 2016-03-05 ENCOUNTER — Encounter (HOSPITAL_COMMUNITY): Payer: Self-pay | Admitting: Orthopedic Surgery

## 2016-03-05 LAB — BASIC METABOLIC PANEL
Anion gap: 9 (ref 5–15)
BUN: 16 mg/dL (ref 6–20)
CO2: 24 mmol/L (ref 22–32)
Calcium: 8.3 mg/dL — ABNORMAL LOW (ref 8.9–10.3)
Chloride: 107 mmol/L (ref 101–111)
Creatinine, Ser: 1.17 mg/dL (ref 0.61–1.24)
GFR calc Af Amer: >60 mL/min (ref >60)
GFR calc non Af Amer: >60 mL/min (ref >60)
Glucose, Bld: 168 mg/dL — ABNORMAL HIGH (ref 65–99)
Potassium: 4 mmol/L (ref 3.5–5.1)
Sodium: 140 mmol/L (ref 135–145)

## 2016-03-05 LAB — CBC
HCT: 38.6 % — ABNORMAL LOW (ref 39.0–52.0)
Hemoglobin: 12.7 g/dL — ABNORMAL LOW (ref 13.0–17.0)
MCH: 27.3 pg (ref 26.0–34.0)
MCHC: 32.9 g/dL (ref 30.0–36.0)
MCV: 82.8 fL (ref 78.0–100.0)
Platelets: 111 10*3/uL — ABNORMAL LOW (ref 150–400)
RBC: 4.66 MIL/uL (ref 4.22–5.81)
RDW: 14.3 % (ref 11.5–15.5)
WBC: 11 10*3/uL — ABNORMAL HIGH (ref 4.0–10.5)

## 2016-03-05 MED ORDER — RIVAROXABAN 10 MG PO TABS: 10.0000 mg | ORAL_TABLET | Freq: Every day | ORAL | 0 refills | Status: DC

## 2016-03-05 MED ORDER — OXYCODONE HCL 5 MG PO TABS: 5.0000 mg | ORAL_TABLET | ORAL | 0 refills | Status: DC | PRN

## 2016-03-05 MED ORDER — TRAMADOL HCL 50 MG PO TABS: 50.0000 mg | ORAL_TABLET | Freq: Four times a day (QID) | ORAL | 1 refills | Status: DC | PRN

## 2016-03-05 MED ORDER — METHOCARBAMOL 500 MG PO TABS: 500.0000 mg | ORAL_TABLET | Freq: Four times a day (QID) | ORAL | 1 refills | Status: DC | PRN

## 2016-03-05 NOTE — Progress Notes (Signed)
OT Cancellation Note  Patient Details Name: FRANCISCO MINTO MRN: [id number] DOB: [birth year]   Cancelled Treatment:    Reason Eval/Treat Not Completed: OT screened, no needs identified, will sign off  Naamah Boggess 03/05/2016, 12:37 PM  Lesle Chris, OTR/L [id number] 03/05/2016

## 2016-03-05 NOTE — Care Management Note (Signed)
Case Management Note  Patient Details  Name: BRAEDAN MEUTH MRN: [id number] Date of Birth: 08-03-1955  Subjective/Objective:                  LEFT TOTAL KNEE ARTHROPLASTY (Left)  Action/Plan: Discharge planning Expected Discharge Date:  03/05/16               Expected Discharge Plan:  Home/Self Care  In-House Referral:     Discharge planning Services  CM Consult  Post Acute Care Choice:  NA Choice offered to:  Patient, Spouse  DME Arranged:  N/A DME Agency:  NA  HH Arranged:  NA HH Agency:  NA  Status of Service:  Completed, signed off  If discussed at [place] of Stay Meetings, dates discussed:    Additional Comments: CM met with pt in room to offer choice and pt states he is going to outpt PT in [place].  Pt also states he does not need any DME as he states he has all he needs at home.  No other CM needs were communicated. Dellie Catholic, RN 03/05/2016, 11:12 AM

## 2016-03-05 NOTE — Discharge Summary (Signed)
Physician Discharge Summary   Patient ID: Thomas Pham MRN: [id number] DOB/AGE: 22-Sep-1955 61 y.o.  Admit date: 03/04/2016 Discharge date: 03-05-2016  Primary Diagnosis:  Osteoarthritis  Left knee(s)  Admission Diagnoses:  Past Medical History:  Diagnosis Date  . Allergy   . Arthritis    osteo  . Chronic kidney disease   . Difficulty sleeping   . GERD (gastroesophageal reflux disease)    takes nexium for acid reflux as needed  . History of gout   . History of hiatal hernia    umbilical hernia  . History of kidney stones   . Hyperlipidemia   . Hypertension   . Hypothyroidism   . Low back pain   . Medial meniscus tear    LEFT  . Organic impotence   . Penile cyst   . Sleep apnea    not using mask d/t preference  . Thyroid disease    Discharge Diagnoses:   Principal Problem:   OA (osteoarthritis) of knee  Estimated body mass index is 36.34 kg/m as calculated from the following:   Height as of this encounter: '5\' 8"'$  (1.727 m).   Weight as of this encounter: 108.4 kg (239 lb).  Procedure:  Procedure(s) (LRB): LEFT TOTAL KNEE ARTHROPLASTY (Left)   Consults: None  HPI:  Thomas Pham is a 61 y.o. year old male with end stage OA of his left knee with progressively worsening pain and dysfunction. He has constant pain, with activity and at rest and significant functional deficits with difficulties even with ADLs. He has had extensive non-op management including analgesics, injections of cortisone and viscosupplements, and home exercise program, but remains in significant pain with significant dysfunction. Radiographs show bone on bone arthritis medial and patellofemoral. He presents now for left Total Knee Arthroplasty.   Laboratory Data: Admission on 03/04/2016  Component Date Value Ref Range Status  . WBC 03/05/2016 11.0* 4.0 - 10.5 K/uL Final  . RBC 03/05/2016 4.66  4.22 - 5.81 MIL/uL Final  . Hemoglobin 03/05/2016 12.7* 13.0 - 17.0 g/dL Final  . HCT 03/05/2016  38.6* 39.0 - 52.0 % Final  . MCV 03/05/2016 82.8  78.0 - 100.0 fL Final  . MCH 03/05/2016 27.3  26.0 - 34.0 pg Final  . MCHC 03/05/2016 32.9  30.0 - 36.0 g/dL Final  . RDW 03/05/2016 14.3  11.5 - 15.5 % Final  . Platelets 03/05/2016 111* 150 - 400 K/uL Final  . Sodium 03/05/2016 140  135 - 145 mmol/L Final  . Potassium 03/05/2016 4.0  3.5 - 5.1 mmol/L Final  . Chloride 03/05/2016 107  101 - 111 mmol/L Final  . CO2 03/05/2016 24  22 - 32 mmol/L Final  . Glucose, Bld 03/05/2016 168* 65 - 99 mg/dL Final  . BUN 03/05/2016 16  6 - 20 mg/dL Final  . Creatinine, Ser 03/05/2016 1.17  0.61 - 1.24 mg/dL Final  . Calcium 03/05/2016 8.3* 8.9 - 10.3 mg/dL Final  . GFR calc non Af Amer 03/05/2016 >60  >60 mL/min Final  . GFR calc Af Amer 03/05/2016 >60  >60 mL/min Final   Comment: (NOTE) The eGFR has been calculated using the CKD EPI equation. This calculation has not been validated in all clinical situations. eGFR's persistently <60 mL/min signify possible Chronic Kidney Disease.   Georgiann Hahn gap 03/05/2016 9  5 - 15 Final  Hospital Outpatient Visit on 02/28/2016  Component Date Value Ref Range Status  . aPTT 02/28/2016 36  24 - 36 seconds Final  .  WBC 02/28/2016 6.2  4.0 - 10.5 K/uL Final  . RBC 02/28/2016 5.20  4.22 - 5.81 MIL/uL Final  . Hemoglobin 02/28/2016 14.3  13.0 - 17.0 g/dL Final  . HCT 02/28/2016 43.3  39.0 - 52.0 % Final  . MCV 02/28/2016 83.3  78.0 - 100.0 fL Final  . MCH 02/28/2016 27.5  26.0 - 34.0 pg Final  . MCHC 02/28/2016 33.0  30.0 - 36.0 g/dL Final  . RDW 02/28/2016 14.3  11.5 - 15.5 % Final  . Platelets 02/28/2016 112* 150 - 400 K/uL Final   Comment: REPEATED TO VERIFY SPECIMEN CHECKED FOR CLOTS PLATELET COUNT CONFIRMED BY SMEAR   . Sodium 02/28/2016 137  135 - 145 mmol/L Final  . Potassium 02/28/2016 3.5  3.5 - 5.1 mmol/L Final  . Chloride 02/28/2016 101  101 - 111 mmol/L Final  . CO2 02/28/2016 27  22 - 32 mmol/L Final  . Glucose, Bld 02/28/2016 182* 65 - 99 mg/dL  Final  . BUN 02/28/2016 19  6 - 20 mg/dL Final  . Creatinine, Ser 02/28/2016 1.33* 0.61 - 1.24 mg/dL Final  . Calcium 02/28/2016 9.0  8.9 - 10.3 mg/dL Final  . Total Protein 02/28/2016 7.2  6.5 - 8.1 g/dL Final  . Albumin 02/28/2016 4.4  3.5 - 5.0 g/dL Final  . AST 02/28/2016 29  15 - 41 U/L Final  . ALT 02/28/2016 31  17 - 63 U/L Final  . Alkaline Phosphatase 02/28/2016 72  38 - 126 U/L Final  . Total Bilirubin 02/28/2016 0.7  0.3 - 1.2 mg/dL Final  . GFR calc non Af Amer 02/28/2016 57* >60 mL/min Final  . GFR calc Af Amer 02/28/2016 >60  >60 mL/min Final   Comment: (NOTE) The eGFR has been calculated using the CKD EPI equation. This calculation has not been validated in all clinical situations. eGFR's persistently <60 mL/min signify possible Chronic Kidney Disease.   . Anion gap 02/28/2016 9  5 - 15 Final  . Prothrombin Time 02/28/2016 12.9  11.4 - 15.2 seconds Final  . INR 02/28/2016 0.97   Final  . ABO/RH(D) 02/28/2016 O POS   Final  . Antibody Screen 02/28/2016 NEG   Final  . Sample Expiration 02/28/2016 03/07/2016   Final  . Extend sample reason 02/28/2016 NO TRANSFUSIONS OR PREGNANCY IN THE PAST 3 MONTHS   Final  . Color, Urine 02/28/2016 COLORLESS* YELLOW Final  . APPearance 02/28/2016 CLEAR  CLEAR Final  . Specific Gravity, Urine 02/28/2016 1.002* 1.005 - 1.030 Final  . pH 02/28/2016 6.0  5.0 - 8.0 Final  . Glucose, UA 02/28/2016 >=500* NEGATIVE mg/dL Final  . Hgb urine dipstick 02/28/2016 NEGATIVE  NEGATIVE Final  . Bilirubin Urine 02/28/2016 NEGATIVE  NEGATIVE Final  . Ketones, ur 02/28/2016 NEGATIVE  NEGATIVE mg/dL Final  . Protein, ur 02/28/2016 NEGATIVE  NEGATIVE mg/dL Final  . Nitrite 02/28/2016 NEGATIVE  NEGATIVE Final  . Leukocytes, UA 02/28/2016 NEGATIVE  NEGATIVE Final  . RBC / HPF 02/28/2016 0-5  0 - 5 RBC/hpf Final  . WBC, UA 02/28/2016 0-5  0 - 5 WBC/hpf Final  . Bacteria, UA 02/28/2016 NONE SEEN  NONE SEEN Final  . Squamous Epithelial / LPF 02/28/2016  NONE SEEN  NONE SEEN Final  . MRSA, PCR 02/28/2016 NEGATIVE  NEGATIVE Final  . Staphylococcus aureus 02/28/2016 POSITIVE* NEGATIVE Final   Comment:        The Xpert SA Assay (FDA approved for NASAL specimens in patients over 55 years of age), is  one component of a comprehensive surveillance program.  Test performance has been validated by [facility] for patients greater than or equal to 56 year old. It is not intended to diagnose infection nor to guide or monitor treatment.   . ABO/RH(D) 02/28/2016 O POS   Final     X-Rays:No results found.  EKG: Orders placed or performed during the hospital encounter of 03/02/14  . EKG 12-Lead  . EKG 12-Lead     Hospital Course: Thomas Pham is a 61 y.o. who was admitted to [facility]. They were brought to the operating room on 03/04/2016 and underwent Procedure(s): LEFT TOTAL KNEE ARTHROPLASTY.  Patient tolerated the procedure well and was later transferred to the recovery room and then to the orthopaedic floor for postoperative care.  They were given PO and IV analgesics for pain control following their surgery.  They were given 24 hours of postoperative antibiotics of  Anti-infectives    Start     Dose/Rate Route Frequency Ordered Stop   03/04/16 1830  ceFAZolin (ANCEF) IVPB 2g/100 mL premix     2 g 200 mL/hr over 30 Minutes Intravenous Every 6 hours 03/04/16 1614 03/05/16 0058   03/04/16 0931  ceFAZolin (ANCEF) IVPB 2g/100 mL premix     2 g 200 mL/hr over 30 Minutes Intravenous On call to O.R. 03/04/16 3790 03/04/16 1217     and started on DVT prophylaxis in the form of Xarelto.   PT and OT were ordered for total joint protocol.  Discharge planning consulted to help with postop disposition and equipment needs.  Patient had a good night on the evening of surgery.  They started to get up OOB with therapy on day one. Hemovac drain was pulled without difficulty.  Dressing was checked and was clean and dry.   Patient was seen in  rounds on POD 1 by Dr. Wynelle Link and was ready to go home.   Diet: Cardiac diet and Renal diet Activity:WBAT Follow-up:in 2 weeks Disposition - Home Discharged Condition: good   Discharge Instructions    Call MD / Call 911    Complete by:  As directed    If you experience chest pain or shortness of breath, CALL 911 and be transported to the hospital emergency room.  If you develope a fever above 101 F, pus (white drainage) or increased drainage or redness at the wound, or calf pain, call your surgeon's office.   Change dressing    Complete by:  As directed    Change dressing daily with sterile 4 x 4 inch gauze dressing and apply TED hose. Do not submerge the incision under water.   Constipation Prevention    Complete by:  As directed    Drink plenty of fluids.  Prune juice may be helpful.  You may use a stool softener, such as Colace (over the counter) 100 mg twice a day.  Use MiraLax (over the counter) for constipation as needed.   Diet - low sodium heart healthy    Complete by:  As directed    Diet Carb Modified    Complete by:  As directed    Discharge instructions    Complete by:  As directed    Pick up stool softner and laxative for home use following surgery while on pain medications. Do not submerge incision under water. Please use good hand washing techniques while changing dressing each day. May shower starting three days after surgery. Please use a clean towel to pat the  incision dry following showers. Continue to use ice for pain and swelling after surgery. Do not use any lotions or creams on the incision until instructed by your surgeon.  Wear both TED hose on both legs during the day every day for three weeks, but may have off at night at home.  Postoperative Constipation Protocol  Constipation - defined medically as fewer than three stools per week and severe constipation as less than one stool per week.  One of the most common issues patients have following  surgery is constipation.  Even if you have a regular bowel pattern at home, your normal regimen is likely to be disrupted due to multiple reasons following surgery.  Combination of anesthesia, postoperative narcotics, change in appetite and fluid intake all can affect your bowels.  In order to avoid complications following surgery, here are some recommendations in order to help you during your recovery period.  Colace (docusate) - Pick up an over-the-counter form of Colace or another stool softener and take twice a day as long as you are requiring postoperative pain medications.  Take with a full glass of water daily.  If you experience loose stools or diarrhea, hold the colace until you stool forms back up.  If your symptoms do not get better within 1 week or if they get worse, check with your doctor.  Dulcolax (bisacodyl) - Pick up over-the-counter and take as directed by the product packaging as needed to assist with the movement of your bowels.  Take with a full glass of water.  Use this product as needed if not relieved by Colace only.   MiraLax (polyethylene glycol) - Pick up over-the-counter to have on hand.  MiraLax is a solution that will increase the amount of water in your bowels to assist with bowel movements.  Take as directed and can mix with a glass of water, juice, soda, coffee, or tea.  Take if you go more than two days without a movement. Do not use MiraLax more than once per day. Call your doctor if you are still constipated or irregular after using this medication for 7 days in a row.  If you continue to have problems with postoperative constipation, please contact the office for further assistance and recommendations.  If you experience "the worst abdominal pain ever" or develop nausea or vomiting, please contact the office immediatly for further recommendations for treatment.   Take Xarelto for two and a half more weeks following discharge from the hospital, then discontinue  Xarelto. Once the patient has completed the blood thinner regimen, then take a Baby 81 mg Aspirin daily for three more weeks.   Do not put a pillow under the knee. Place it under the heel.    Complete by:  As directed    Do not sit on low chairs, stoools or toilet seats, as it may be difficult to get up from low surfaces    Complete by:  As directed    Driving restrictions    Complete by:  As directed    No driving until released by the physician.   Increase activity slowly as tolerated    Complete by:  As directed    Lifting restrictions    Complete by:  As directed    No lifting until released by the physician.   Patient may shower    Complete by:  As directed    You may shower without a dressing once there is no drainage.  Do not wash over the  wound.  If drainage remains, do not shower until drainage stops.   TED hose    Complete by:  As directed    Use stockings (TED hose) for 3 weeks on both leg(s).  You may remove them at night for sleeping.   Weight bearing as tolerated    Complete by:  As directed    Laterality:  left   Extremity:  Lower     Allergies as of 03/05/2016   No Known Allergies     Medication List    STOP taking these medications   aspirin EC 81 MG tablet   calcium carbonate 1500 (600 Ca) MG Tabs tablet Commonly known as:  OSCAL   celecoxib 200 MG capsule Commonly known as:  CELEBREX   GLUCOSAMINE 1500 COMPLEX PO   HYDROcodone-acetaminophen 5-325 MG tablet Commonly known as:  NORCO/VICODIN   Vitamin D (Ergocalciferol) 50000 units Caps capsule Commonly known as:  DRISDOL     TAKE these medications   cetirizine 10 MG tablet Commonly known as:  ZYRTEC Take 10 mg by mouth at bedtime. For extreme itching   colchicine 0.6 MG tablet Take 0.6 mg by mouth every morning.   esomeprazole 40 MG capsule Commonly known as:  NEXIUM Take 40 mg by mouth daily at 12 noon.   febuxostat 40 MG tablet Commonly known as:  ULORIC Take 40 mg by mouth daily.     flunisolide 29 MCG/ACT nasal spray Commonly known as:  NASAREL Place 2 sprays into the nose as needed for rhinitis. Dose is for each nostril.   levothyroxine 75 MCG tablet Commonly known as:  SYNTHROID, LEVOTHROID Take 75 mcg by mouth daily before breakfast.   methocarbamol 500 MG tablet Commonly known as:  ROBAXIN Take 1 tablet (500 mg total) by mouth every 6 (six) hours as needed for muscle spasms. What changed:  when to take this  reasons to take this  additional instructions   montelukast 10 MG tablet Commonly known as:  SINGULAIR Take 10 mg by mouth every morning.   olmesartan-hydrochlorothiazide 20-12.5 MG tablet Commonly known as:  BENICAR HCT Take 1 tablet by mouth daily.   oxyCODONE 5 MG immediate release tablet Commonly known as:  Oxy IR/ROXICODONE Take 1-2 tablets (5-10 mg total) by mouth every 4 (four) hours as needed for breakthrough pain.   ranitidine 150 MG tablet Commonly known as:  ZANTAC Take 150 mg by mouth 2 (two) times daily. For extreme itching   RELISTOR 150 MG Tabs Generic drug:  Methylnaltrexone Bromide Take 150 mg by mouth daily.   rivaroxaban 10 MG Tabs tablet Commonly known as:  XARELTO Take 1 tablet (10 mg total) by mouth daily with breakfast.   sildenafil 100 MG tablet Commonly known as:  VIAGRA Take 100 mg by mouth daily as needed for erectile dysfunction.   simvastatin 40 MG tablet Commonly known as:  ZOCOR Take 40 mg by mouth daily at 6 PM.   traMADol 50 MG tablet Commonly known as:  ULTRAM Take 1-2 tablets (50-100 mg total) by mouth every 6 (six) hours as needed for moderate pain.      Follow-up Information    Gearlean Alf, MD. Schedule an appointment as soon as possible for a visit on 03/17/2016.   Specialty:  Orthopedic Surgery Why:  Call [phone] tomorrow to make the appintment Contact information: [address] [place] [address] [phone]           Signed: Arlee Muslim,  PA-C Orthopaedic Surgery 03/05/2016, 9:15  AM     

## 2016-03-05 NOTE — Evaluation (Signed)
Physical Therapy Evaluation Patient Details Name: Thomas Pham MRN: [id number] DOB: Jun 24, 1955 Today's Date: 03/05/2016   History of Present Illness  Pt s/p L TKR  Clinical Impression  Pt s/p L TKR presents with decreased L LE strength/ROM and post op pain limiting functional mobility.  Pt should progress well to dc home with family assist and follow up with OP PT.    Follow Up Recommendations Outpatient PT    Equipment Recommendations  None recommended by PT    Recommendations for Other Services OT consult     Precautions / Restrictions Precautions Precautions: Knee;Fall Required Braces or Orthoses: Knee Immobilizer - Left Knee Immobilizer - Left: Discontinue once straight leg raise with < 10 degree lag (Pt performed IND SLR this am) Restrictions Weight Bearing Restrictions: No Other Position/Activity Restrictions: WBAT      Mobility  Bed Mobility Overal bed mobility: Needs Assistance Bed Mobility: Supine to Sit     Supine to sit: Supervision     General bed mobility comments: cues to slow  Transfers Overall transfer level: Needs assistance Equipment used: Rolling walker (2 wheeled) Transfers: Sit to/from Stand Sit to Stand: Min guard         General transfer comment: cues for LE management and use of UES to self assist  Ambulation/Gait Ambulation/Gait assistance: Min assist;Min guard Ambulation Distance (Feet): 120 Feet Assistive device: Rolling walker (2 wheeled) Gait Pattern/deviations: Step-to pattern;Step-through pattern;Decreased step length - right;Decreased step length - left;Shuffle;Trunk flexed Gait velocity: decr Gait velocity interpretation: Below normal speed for age/gender General Gait Details: cues for sequence, posture and position from [employer]            Wheelchair Mobility    Modified Rankin (Stroke Patients Only)       Balance Overall balance assessment: No apparent balance deficits (not formally assessed)                                           Pertinent Vitals/Pain Pain Assessment: 0-10 Pain Score: 5  Pain Location: L knee Pain Descriptors / Indicators: Aching;Sore Pain Intervention(s): Limited activity within patient's tolerance;Monitored during session;Premedicated before session;Ice applied    Home Living Family/patient expects to be discharged to:: Private residence Living Arrangements: Parent Available Help at Discharge: Family Type of Home: House Home Access: Stairs to enter Entrance Stairs-Rails: None Entrance Stairs-Number of Steps: 2 Home Layout: Two level Home Equipment: Walker - standard;Cane - single point;Crutches      Prior Function Level of Independence: Independent               Hand Dominance        Extremity/Trunk Assessment   Upper Extremity Assessment Upper Extremity Assessment: Overall WFL for tasks assessed    Lower Extremity Assessment Lower Extremity Assessment: LLE deficits/detail LLE Deficits / Details: 3/5 quads with IND SLR and AAROM at knee -10 - 90    Cervical / Trunk Assessment Cervical / Trunk Assessment: Normal  Communication   Communication: No difficulties  Cognition Arousal/Alertness: Awake/alert Behavior During Therapy: WFL for tasks assessed/performed Overall Cognitive Status: Within Functional Limits for tasks assessed                      General Comments      Exercises Total Joint Exercises Ankle Circles/Pumps: AROM;Both;15 reps;Supine Quad Sets: AROM;Both;10 reps;Supine Heel Slides: AAROM;Left;15 reps;Supine Straight Leg Raises: AAROM;Left;10  reps;Supine   Assessment/Plan    PT Assessment Patient needs continued PT services  PT Problem List Decreased strength;Decreased range of motion;Decreased activity tolerance;Decreased mobility;Decreased knowledge of use of DME;Pain;Obesity          PT Treatment Interventions DME instruction;Gait training;Stair training;Functional mobility  training;Therapeutic activities;Therapeutic exercise;Patient/family education    PT Goals (Current goals can be found in the Care Plan section)  Acute Rehab PT Goals Patient Stated Goal: Regain IND ASAP PT Goal Formulation: With patient Time For Goal Achievement: 03/07/16 Potential to Achieve Goals: Good    Frequency 7X/week   Barriers to discharge        Co-evaluation               End of Session Equipment Utilized During Treatment: Gait belt Activity Tolerance: Patient tolerated treatment well Patient left: in chair;with call bell/phone within reach;with family/visitor present Nurse Communication: Mobility status         Time: [id number] PT Time Calculation (min) (ACUTE ONLY): 31 min   Charges:   PT Evaluation $PT Eval Low Complexity: 1 Procedure PT Treatments $Therapeutic Exercise: 8-22 mins   PT G Codes:        Thomas Pham 03/13/2016, 12:22 PM

## 2016-03-05 NOTE — Progress Notes (Signed)
Physical Therapy Treatment Patient Details Name: Thomas Pham MRN: [id number] DOB: 04-25-1955 Today's Date: 03/05/2016    History of Present Illness Pt s/p L TKR    PT Comments    Pt progressing well with mobility.  Reviewed stairs, home therex and don/doff KI for side sleeping.    Follow Up Recommendations  Outpatient PT     Equipment Recommendations  Crutches    Recommendations for Other Services OT consult     Precautions / Restrictions Precautions Precautions: Knee;Fall Required Braces or Orthoses: Knee Immobilizer - Left Knee Immobilizer - Left: Discontinue once straight leg raise with < 10 degree lag Restrictions Weight Bearing Restrictions: No Other Position/Activity Restrictions: WBAT    Mobility  Bed Mobility                  Transfers Overall transfer level: Modified independent   Transfers: Sit to/from Stand Sit to Stand: Modified independent (Device/Increase time)         General transfer comment: Pt self cueing  Ambulation/Gait Ambulation/Gait assistance: Supervision Ambulation Distance (Feet): 150 Feet (and 100) Assistive device: Rolling walker (2 wheeled);Crutches Gait Pattern/deviations: Step-to pattern;Step-through pattern;Decreased step length - right;Decreased step length - left;Shuffle;Trunk flexed Gait velocity: decr Gait velocity interpretation: Below normal speed for age/gender General Gait Details: Pt ambulated 100' with SW and additional 150' with crutches.  Pt demonstrating good balance and safety awareness with both devices   Stairs Stairs: Yes   Stair Management: Step to pattern;Forwards;With crutches;No rails Number of Stairs: 8 General stair comments: Pt negotiated stairs with bil crutches and single crutch and wall to simulate home setting  Wheelchair Mobility    Modified Rankin (Stroke Patients Only)       Balance Overall balance assessment: No apparent balance deficits (not formally assessed)                                   Cognition Arousal/Alertness: Awake/alert Behavior During Therapy: WFL for tasks assessed/performed Overall Cognitive Status: Within Functional Limits for tasks assessed                      Exercises Total Joint Exercises Ankle Circles/Pumps: AROM;Both;15 reps;Supine Quad Sets: AROM;Both;Supine;5 reps Heel Slides: AAROM;Left;Supine;10 reps (Pt educated on self assist with sheet) Straight Leg Raises: AAROM;Left;10 reps;Supine Long Arc Quad: AROM;Left;10 reps;Seated    General Comments        Pertinent Vitals/Pain Pain Assessment: 0-10 Pain Score: 6  Pain Location: L knee Pain Descriptors / Indicators: Aching;Sore Pain Intervention(s): Limited activity within patient's tolerance;Monitored during session;Premedicated before session;Ice applied    Home Living                      Prior Function            PT Goals (current goals can now be found in the care plan section) Acute Rehab PT Goals Patient Stated Goal: Regain IND ASAP PT Goal Formulation: With patient Time For Goal Achievement: 03/07/16 Potential to Achieve Goals: Good Progress towards PT goals: Progressing toward goals    Frequency    7X/week      PT Plan Current plan remains appropriate    Co-evaluation             End of Session Equipment Utilized During Treatment: Gait belt Activity Tolerance: Patient tolerated treatment well Patient left: in chair;with call bell/phone within reach;with family/visitor present  Time: [id number] PT Time Calculation (min) (ACUTE ONLY): 48 min  Charges:  $Gait Training: 8-22 mins $Therapeutic Exercise: 8-22 mins $Therapeutic Activity: 8-22 mins                    G Codes:      Thomas Pham 28-Mar-2016, 5:18 PM

## 2016-03-05 NOTE — Progress Notes (Signed)
   Subjective: 1 Day Post-Op Procedure(s) (LRB): LEFT TOTAL KNEE ARTHROPLASTY (Left) Patient reports pain as mild.  Wants to go home today We will start therapy today.  Plan is to go Home after hospital stay.  Objective: Vital signs in last 24 hours: Temp:  [97.7 F (36.5 C)-99 F (37.2 C)] 98.2 F (36.8 C) (01/18 0643) Pulse Rate:  [64-101] 81 (01/18 0643) Resp:  [10-22] 16 (01/18 0643) BP: (120-152)/(80-103) 152/81 (01/18 0643) SpO2:  [93 %-100 %] 96 % (01/18 0643) Weight:  [108.4 kg (239 lb)] 108.4 kg (239 lb) (01/17 1558)  Intake/Output from previous day:  Intake/Output Summary (Last 24 hours) at 03/05/16 0746 Last data filed at 03/05/16 0700  Gross per 24 hour  Intake             3785 ml  Output             3445 ml  Net              340 ml    Intake/Output this shift: No intake/output data recorded.  Labs:  Recent Labs  03/05/16 0440  HGB 12.7*    Recent Labs  03/05/16 0440  WBC 11.0*  RBC 4.66  HCT 38.6*  PLT 111*    Recent Labs  03/05/16 0440  NA 140  K 4.0  CL 107  CO2 24  BUN 16  CREATININE 1.17  GLUCOSE 168*  CALCIUM 8.3*   No results for input(s): LABPT, INR in the last 72 hours.  EXAM General - Patient is Alert, Appropriate and Oriented Extremity - Neurologically intact Neurovascular intact No cellulitis present Compartment soft Dressing - dressing C/D/I Motor Function - intact, moving foot and toes well on exam.  Hemovac pulled without difficulty.  Past Medical History:  Diagnosis Date  . Allergy   . Arthritis    osteo  . Chronic kidney disease   . Difficulty sleeping   . GERD (gastroesophageal reflux disease)    takes nexium for acid reflux as needed  . History of gout   . History of hiatal hernia    umbilical hernia  . History of kidney stones   . Hyperlipidemia   . Hypertension   . Hypothyroidism   . Low back pain   . Medial meniscus tear    LEFT  . Organic impotence   . Penile cyst   . Sleep apnea    not  using mask d/t preference  . Thyroid disease     Assessment/Plan: 1 Day Post-Op Procedure(s) (LRB): LEFT TOTAL KNEE ARTHROPLASTY (Left) Principal Problem:   OA (osteoarthritis) of knee   Advance diet Up with therapy D/C IV fluids Discharge home with home health as long as he meets therapy goals this afternoon  DVT Prophylaxis - Xarelto Weight-Bearing as tolerated to left leg D/C O2 and Pulse OX and try on Room Air  Rebakah Cokley V 03/05/2016, 7:46 AM

## 2016-03-05 NOTE — Discharge Instructions (Addendum)
° °Dr. Frank Aluisio °Total Joint Specialist °[facility] °[address] °, [place] [address] °[phone] ° °TOTAL KNEE REPLACEMENT POSTOPERATIVE DIRECTIONS ° °Knee Rehabilitation, Guidelines Following Surgery  °Results after knee surgery are often greatly improved when you follow the exercise, range of motion and muscle strengthening exercises prescribed by your doctor. Safety measures are also important to protect the knee from further injury. Any time any of these exercises cause you to have increased pain or swelling in your knee joint, decrease the amount until you are comfortable again and slowly increase them. If you have problems or questions, call your caregiver or physical therapist for advice.  ° °HOME CARE INSTRUCTIONS  °Remove items at home which could result in a fall. This includes throw rugs or furniture in walking pathways.  °· ICE to the affected knee every three hours for 30 minutes at a time and then as needed for pain and swelling.  Continue to use ice on the knee for pain and swelling from surgery. You may notice swelling that will progress down to the foot and ankle.  This is normal after surgery.  Elevate the leg when you are not up walking on it.   °· Continue to use the breathing machine which will help keep your temperature down.  It is common for your temperature to cycle up and down following surgery, especially at night when you are not up moving around and exerting yourself.  The breathing machine keeps your lungs expanded and your temperature down. °· Do not place pillow under knee, focus on keeping the knee straight while resting ° °DIET °You may resume your previous home diet once your are discharged from the hospital. ° °DRESSING / WOUND CARE / SHOWERING °You may start showering once you are discharged home but do not submerge the incision under water. Just pat the incision dry and apply a dry gauze dressing on daily. °Change the surgical dressing  daily and reapply a dry dressing each time. ° °ACTIVITY °Walk with your walker as instructed. °Use walker as long as suggested by your caregivers. °Avoid periods of inactivity such as sitting longer than an hour when not asleep. This helps prevent blood clots.  °You may resume a sexual relationship in one month or when given the OK by your doctor.  °You may return to work once you are cleared by your doctor.  °Do not drive a car for 6 weeks or until released by you surgeon.  °Do not drive while taking narcotics. ° °WEIGHT BEARING °Weight bearing as tolerated with assist device (walker, cane, etc) as directed, use it as long as suggested by your surgeon or therapist, typically at least 4-6 weeks. ° °POSTOPERATIVE CONSTIPATION PROTOCOL °Constipation - defined medically as fewer than three stools per week and severe constipation as less than one stool per week. ° °One of the most common issues patients have following surgery is constipation.  Even if you have a regular bowel pattern at home, your normal regimen is likely to be disrupted due to multiple reasons following surgery.  Combination of anesthesia, postoperative narcotics, change in appetite and fluid intake all can affect your bowels.  In order to avoid complications following surgery, here are some recommendations in order to help you during your recovery period. ° °Colace (docusate) - Pick up an over-the-counter form of Colace or another stool softener and take twice a day as long as you are requiring postoperative pain medications.  Take with a full glass of water   daily.  If you experience loose stools or diarrhea, hold the colace until you stool forms back up.  If your symptoms do not get better within 1 week or if they get worse, check with your doctor. ° °Dulcolax (bisacodyl) - Pick up over-the-counter and take as directed by the product packaging as needed to assist with the movement of your bowels.  Take with a full glass of water.  Use this product as  needed if not relieved by Colace only.  ° °MiraLax (polyethylene glycol) - Pick up over-the-counter to have on hand.  MiraLax is a solution that will increase the amount of water in your bowels to assist with bowel movements.  Take as directed and can mix with a glass of water, juice, soda, coffee, or tea.  Take if you go more than two days without a movement. °Do not use MiraLax more than once per day. Call your doctor if you are still constipated or irregular after using this medication for 7 days in a row. ° °If you continue to have problems with postoperative constipation, please contact the office for further assistance and recommendations.  If you experience "the worst abdominal pain ever" or develop nausea or vomiting, please contact the office immediatly for further recommendations for treatment. ° °ITCHING ° If you experience itching with your medications, try taking only a single pain pill, or even half a pain pill at a time.  You can also use Benadryl over the counter for itching or also to help with sleep.  ° °TED HOSE STOCKINGS °Wear the elastic stockings on both legs for three weeks following surgery during the day but you may remove then at night for sleeping. ° °MEDICATIONS °See your medication summary on the “After Visit Summary” that the nursing staff will review with you prior to discharge.  You may have some home medications which will be placed on hold until you complete the course of blood thinner medication.  It is important for you to complete the blood thinner medication as prescribed by your surgeon.  Continue your approved medications as instructed at time of discharge. ° °PRECAUTIONS °If you experience chest pain or shortness of breath - call 911 immediately for transfer to the hospital emergency department.  °If you develop a fever greater that 101 F, purulent drainage from wound, increased redness or drainage from wound, foul odor from the wound/dressing, or calf pain - CONTACT YOUR  SURGEON.   °                                                °FOLLOW-UP APPOINTMENTS °Make sure you keep all of your appointments after your operation with your surgeon and caregivers. You should call the office at the above phone number and make an appointment for approximately two weeks after the date of your surgery or on the date instructed by your surgeon outlined in the "After Visit Summary". ° ° °RANGE OF MOTION AND STRENGTHENING EXERCISES  °Rehabilitation of the knee is important following a knee injury or an operation. After just a few days of immobilization, the muscles of the thigh which control the knee become weakened and shrink (atrophy). Knee exercises are designed to build up the tone and strength of the thigh muscles and to improve knee motion. Often times heat used for twenty to thirty minutes before working out will loosen   up your tissues and help with improving the range of motion but do not use heat for the first two weeks following surgery. These exercises can be done on a training (exercise) mat, on the floor, on a table or on a bed. Use what ever works the best and is most comfortable for you Knee exercises include:  Leg Lifts - While your knee is still immobilized in a splint or cast, you can do straight leg raises. Lift the leg to 60 degrees, hold for 3 sec, and slowly lower the leg. Repeat 10-20 times 2-3 times daily. Perform this exercise against resistance later as your knee gets better.  Quad and Hamstring Sets - Tighten up the muscle on the front of the thigh (Quad) and hold for 5-10 sec. Repeat this 10-20 times hourly. Hamstring sets are done by pushing the foot backward against an object and holding for 5-10 sec. Repeat as with quad sets.   Leg Slides: Lying on your back, slowly slide your foot toward your buttocks, bending your knee up off the floor (only go as far as is comfortable). Then slowly slide your foot back down until your leg is flat on the floor again.  Angel Wings:  Lying on your back spread your legs to the side as far apart as you can without causing discomfort.  A rehabilitation program following serious knee injuries can speed recovery and prevent re-injury in the future due to weakened muscles. Contact your doctor or a physical therapist for more information on knee rehabilitation.   IF YOU ARE TRANSFERRED TO A SKILLED REHAB FACILITY If the patient is transferred to a skilled rehab facility following release from the hospital, a list of the current medications will be sent to the facility for the patient to continue.  When discharged from the skilled rehab facility, please have the facility set up the patient's [place] prior to being released. Also, the skilled facility will be responsible for providing the patient with their medications at time of release from the facility to include their pain medication, the muscle relaxants, and their blood thinner medication. If the patient is still at the rehab facility at time of the two week follow up appointment, the skilled rehab facility will also need to assist the patient in arranging follow up appointment in our office and any transportation needs.  MAKE SURE YOU:  Understand these instructions.  Get help right away if you are not doing well or get worse.    Pick up stool softner and laxative for home use following surgery while on pain medications. Do not submerge incision under water. Please use good hand washing techniques while changing dressing each day. May shower starting three days after surgery. Please use a clean towel to pat the incision dry following showers. Continue to use ice for pain and swelling after surgery. Do not use any lotions or creams on the incision until instructed by your surgeon.  Take Xarelto for two and a half more weeks following discharge from the hospital, then discontinue Xarelto. Once the patient has completed the blood thinner regimen, then take a  Baby 81 mg Aspirin daily for three more weeks.  Information on my medicine - XARELTO (Rivaroxaban)  This medication education was reviewed with me or my healthcare representative as part of my discharge preparation.  The pharmacist that spoke with me during my hospital stay was:  Modena Morrow, PharmD Candidate  Why was Xarelto prescribed for you? Xarelto was prescribed for you  to reduce the risk of blood clots forming after orthopedic surgery. The medical term for these abnormal blood clots is venous thromboembolism (VTE).  What do you need to know about xarelto ? Take your Xarelto ONCE DAILY at the same time every day. You may take it either with or without food.  If you have difficulty swallowing the tablet whole, you may crush it and mix in applesauce just prior to taking your dose.  Take Xarelto exactly as prescribed by your doctor and DO NOT stop taking Xarelto without talking to the doctor who prescribed the medication.  Stopping without other VTE prevention medication to take the place of Xarelto may increase your risk of developing a clot.  After discharge, you should have regular check-up appointments with your healthcare provider that is prescribing your Xarelto.    What do you do if you miss a dose? If you miss a dose, take it as soon as you remember on the same day then continue your regularly scheduled once daily regimen the next day. Do not take two doses of Xarelto on the same day.   Important Safety Information A possible side effect of Xarelto is bleeding. You should call your healthcare provider right away if you experience any of the following: ? Bleeding from an injury or your nose that does not stop. ? Unusual colored urine (red or dark brown) or unusual colored stools (red or black). ? Unusual bruising for unknown reasons. ? A serious fall or if you hit your head (even if there is no bleeding).  Some medicines may interact with Xarelto and might  increase your risk of bleeding while on Xarelto. To help avoid this, consult your healthcare provider or pharmacist prior to using any new prescription or non-prescription medications, including herbals, vitamins, non-steroidal anti-inflammatory drugs (NSAIDs) and supplements.  This website has more information on Xarelto: [email or web address]

## 2016-03-10 ENCOUNTER — Ambulatory Visit: Payer: BLUE CROSS/BLUE SHIELD | Attending: Orthopedic Surgery | Admitting: Physical Therapy

## 2016-03-10 DIAGNOSIS — M6281 Muscle weakness (generalized): Secondary | ICD-10-CM | POA: Insufficient documentation

## 2016-03-10 DIAGNOSIS — R600 Localized edema: Secondary | ICD-10-CM | POA: Diagnosis present

## 2016-03-10 DIAGNOSIS — M25562 Pain in left knee: Secondary | ICD-10-CM | POA: Diagnosis not present

## 2016-03-10 DIAGNOSIS — M25662 Stiffness of left knee, not elsewhere classified: Secondary | ICD-10-CM | POA: Insufficient documentation

## 2016-03-10 NOTE — Therapy (Signed)
[facility] [place], [place], [address] Phone: [phone]   Fax:  [phone]  Physical Therapy Evaluation  Patient Details  Name: Thomas Pham MRN: [id number] Date of Birth: 05-17-1955 Referring Provider: Gaynelle Arabian MD.  Encounter Date: 03/10/2016      PT End of Session - 03/10/16 1433    Visit Number 1   Number of Visits 12   Date for PT Re-Evaluation 04/21/16   PT Start Time 0147   PT Stop Time 0242   PT Time Calculation (min) 55 min   Activity Tolerance Patient tolerated treatment well   Behavior During Therapy [facility] for tasks assessed/performed      Past Medical History:  Diagnosis Date  . Allergy   . Arthritis    osteo  . Chronic kidney disease   . Difficulty sleeping   . GERD (gastroesophageal reflux disease)    takes nexium for acid reflux as needed  . History of gout   . History of hiatal hernia    umbilical hernia  . History of kidney stones   . Hyperlipidemia   . Hypertension   . Hypothyroidism   . Low back pain   . Medial meniscus tear    LEFT  . Organic impotence   . Penile cyst   . Sleep apnea    not using mask d/t preference  . Thyroid disease     Past Surgical History:  Procedure Laterality Date  . KNEE ARTHROSCOPY Left 03/07/2014   Procedure: LEFT ARTHROSCOPY KNEE WITH MEDIAL MENISCECTOMY  DEBRIDEMENT AND CHONDROPLASTY ;  Surgeon: Gearlean Alf, MD;  Location: [facility];  Service: Orthopedics;  Laterality: Left;  . THYROIDECTOMY Right 2013  . TONSILLECTOMY     childhood  . TOTAL KNEE ARTHROPLASTY Left 03/04/2016   Procedure: LEFT TOTAL KNEE ARTHROPLASTY;  Surgeon: Gaynelle Arabian, MD;  Location: [facility];  Service: Orthopedics;  Laterality: Left;    There were no vitals filed for this visit.       Subjective Assessment - 03/10/16 1422    Subjective The patient underwent a left total knee replacement on 03/04/16.  He states initially he was doing great and was working very hard.   He states he achieved 90 degrees of left knee flexion in the hospital.  He was prescribed a HEP and worked hard on them at home.  he sates that by last Thursday (afternoon) he was no longer able to perform a SLR due to pain (he pointed near his distal quads).  He is wearing TED hose and using a FWW for safe ambulation.  He states his steri-strips are intact and he mother is performing sterile dressing changes at home.  He sees his Psychologist, sport and exercise next week.  Overactivity increases his pain and rest decreases his pain.   Pertinent History H/o LBP.   Patient Stated Goals Get knee back to normal.   Pain Score 8    Pain Location Knee   Pain Orientation Left   Pain Descriptors / Indicators Aching   Pain Type Surgical pain   Pain Frequency Constant            [facility] PT Assessment - 03/10/16 0001      Assessment   Medical Diagnosis left total knee replacement.   Referring Provider Gaynelle Arabian MD.   Onset Date/Surgical Date --  03/04/16 (surgery date).     Precautions   Precautions --  No ultrasound.     Restrictions   Weight Bearing Restrictions No  Balance Screen   Has the patient fallen in the past 6 months No   Has the patient had a decrease in activity level because of a fear of falling?  No   Is the patient reluctant to leave their home because of a fear of falling?  No     Home Environment   Living Environment Private residence     Observation/Other Assessments   Observations --  Left knee covered with sterile dressing.     Observation/Other Assessments-Edema    Edema Circumferential     Circumferential Edema   Circumferential - Left  LT 7 cms > RT.  Significant left knee/thigh ecchymosis.  TED hose donned.       ROM / Strength   AROM / PROM / Strength AROM;Strength     AROM   Overall AROM Comments In supine left knee extension limited by 25 degrees with flexion while seated to 70 degrees.     Strength   Overall Strength Comments Left hip= 2/5; left knee extension=  2-/5.       Palpation   Palpation comment Tender to palpation at left distal quadriceps region and left knee medial joint line.     Transfers   Comments Supine to sit with manual assist to right LE.     Ambulation/Gait   Gait Pattern Step-to pattern;Decreased step length - right;Decreased step length - left;Decreased stance time - left;Antalgic;Poor foot clearance - left  Patient using a FWW.                   [facility] Adult PT Treatment/Exercise - 03/10/16 0001      Exercises   Exercises Knee/Hip     Knee/Hip Exercises: Aerobic   Nustep Level 2 x 10 minutes moving forward x 1 to increase knee flexion.     Modalities   Modalities Vasopneumatic     Vasopneumatic   Number Minutes Vasopneumatic  15 minutes   Vasopnuematic Location  --  Left knee.   Vasopneumatic Pressure Low                     PT Long Term Goals - 03/10/16 1716      PT LONG TERM GOAL #1   Title Independent with a HEP.   Time 4   Period Weeks   Status New     PT LONG TERM GOAL #2   Title Full active left knee extension.   Time 4   Period Weeks   Status New     PT LONG TERM GOAL #3   Title Active left knee flexion to 115 degrees+ so the patient can perform functional tasks and do so with pain not > 2-3/10.   Time 4   Period Weeks   Status New     PT LONG TERM GOAL #4   Title Decrease edema to within 2 cms of non-affected side to assist with pain reduction and range of motion gains.   Time 4   Period Weeks   Status New     PT LONG TERM GOAL #5   Title Perform a reciprocating stair gait with one railing with pain not > 2-3/10.   Time 4   Period Weeks   Status New     PT LONG TERM GOAL #6   Title Increase left knee strength to a solid 4+/5 to provide good stability for accomplishment of functional activities   Time 4   Period Weeks   Status New  Plan - 03/10/16 1659    Clinical Impression Statement The patient has significant losses of left  knee range of motion and a great deal of edema presently.  He is unable to perform a SLR due to severe a distal anterior knee pain.  He has no active left knee extension from the seated position as well.  He is currently walking with a FWW for safety.   Rehab Potential Good   PT Frequency 3x / week   PT Duration 4 weeks   PT Treatment/Interventions ADLs/Self Care Home Management;Cryotherapy;Electrical Stimulation;Gait training;Stair training;Functional mobility training;Therapeutic activities;Therapeutic exercise;Neuromuscular re-education;Patient/family education;Passive range of motion;Manual techniques;Vasopneumatic Device   PT Next Visit Plan Nustep; edema control; ROM left knee.   Consulted and Agree with Plan of Care Patient      Patient will benefit from skilled therapeutic intervention in order to improve the following deficits and impairments:  Abnormal gait, Pain, Increased edema, Decreased activity tolerance, Decreased range of motion, Decreased strength, Decreased mobility  Visit Diagnosis: Acute pain of left knee - Plan: PT plan of care cert/re-cert  Stiffness of left knee, not elsewhere classified - Plan: PT plan of care cert/re-cert  Localized edema - Plan: PT plan of care cert/re-cert  Muscle weakness (generalized) - Plan: PT plan of care cert/re-cert     Problem List Patient Active Problem List   Diagnosis Date Noted  . OA (osteoarthritis) of knee 03/04/2016  . Acute medial meniscal tear 03/06/2014  . Cold hands and feet 08/23/2012    Asir Bingley, [place] MPT 03/10/2016, 5:21 PM  [facility] [address] [place], [place], [address] Phone: [phone]   Fax:  [phone]  Name: RONELL CARLON MRN: [id number] Date of Birth: 16-Dec-1955

## 2016-03-11 ENCOUNTER — Ambulatory Visit: Payer: BLUE CROSS/BLUE SHIELD | Admitting: Physical Therapy

## 2016-03-11 ENCOUNTER — Encounter: Payer: Self-pay | Admitting: Physical Therapy

## 2016-03-11 DIAGNOSIS — M25662 Stiffness of left knee, not elsewhere classified: Secondary | ICD-10-CM

## 2016-03-11 DIAGNOSIS — M6281 Muscle weakness (generalized): Secondary | ICD-10-CM

## 2016-03-11 DIAGNOSIS — R600 Localized edema: Secondary | ICD-10-CM

## 2016-03-11 DIAGNOSIS — M25562 Pain in left knee: Secondary | ICD-10-CM | POA: Diagnosis not present

## 2016-03-11 NOTE — Therapy (Signed)
[facility] [place], [place], [address] Phone: [phone]   Fax:  [phone]  Physical Therapy Treatment  Patient Details  Name: Thomas Pham MRN: [id number] Date of Birth: [birth year] Referring Provider: Gaynelle Arabian MD.  Encounter Date: 03/11/2016      PT End of Session - 03/11/16 1350    Visit Number 2   Number of Visits 12   Date for PT Re-Evaluation 04/21/16   PT Start Time 1346   PT Stop Time 1437   PT Time Calculation (min) 51 min   Activity Tolerance Patient tolerated treatment well   Behavior During Therapy [facility] for tasks assessed/performed      Past Medical History:  Diagnosis Date  . Allergy   . Arthritis    osteo  . Chronic kidney disease   . Difficulty sleeping   . GERD (gastroesophageal reflux disease)    takes nexium for acid reflux as needed  . History of gout   . History of hiatal hernia    umbilical hernia  . History of kidney stones   . Hyperlipidemia   . Hypertension   . Hypothyroidism   . Low back pain   . Medial meniscus tear    LEFT  . Organic impotence   . Penile cyst   . Sleep apnea    not using mask d/t preference  . Thyroid disease     Past Surgical History:  Procedure Laterality Date  . KNEE ARTHROSCOPY Left 03/07/2014   Procedure: LEFT ARTHROSCOPY KNEE WITH MEDIAL MENISCECTOMY  DEBRIDEMENT AND CHONDROPLASTY ;  Surgeon: Gearlean Alf, MD;  Location: [facility];  Service: Orthopedics;  Laterality: Left;  . THYROIDECTOMY Right 2013  . TONSILLECTOMY     childhood  . TOTAL KNEE ARTHROPLASTY Left 03/04/2016   Procedure: LEFT TOTAL KNEE ARTHROPLASTY;  Surgeon: Gaynelle Arabian, MD;  Location: [facility];  Service: Orthopedics;  Laterality: Left;    There were no vitals filed for this visit.      Subjective Assessment - 03/11/16 1349    Subjective Reports that he has done his exercises so he may require assist with lifting LLE.  Reports that he needs the vaso as he has more  swelling.    Pertinent History H/o LBP.   Patient Stated Goals Get knee back to normal.   Currently in Pain? Yes   Pain Score 7    Pain Location Knee   Pain Orientation Left   Pain Descriptors / Indicators Sore;Discomfort   Pain Type Surgical pain            [facility] PT Assessment - 03/11/16 0001      Assessment   Medical Diagnosis left total knee replacement.   Onset Date/Surgical Date 03/04/16   Next MD Visit 03/17/2016     Restrictions   Weight Bearing Restrictions No                     [facility] Adult PT Treatment/Exercise - 03/11/16 0001      Knee/Hip Exercises: Aerobic   Nustep L4, seat 13 x10 min     Knee/Hip Exercises: Standing   Hip Flexion AROM;Left;1 set;15 reps;Knee bent   Forward Lunges Left;1 set;15 reps;3 seconds   Hip Abduction AROM;Left;1 set;15 reps;Knee straight   Rocker Board 3 minutes     Modalities   Modalities Vasopneumatic     Vasopneumatic   Number Minutes Vasopneumatic  15 minutes   Vasopnuematic Location  Knee   Vasopneumatic Pressure  Low   Vasopneumatic Temperature  67     Manual Therapy   Manual Therapy Passive ROM   Passive ROM PROM of L knee into flexion with holds at end range                     PT Long Term Goals - 03/10/16 1716      PT LONG TERM GOAL #1   Title Independent with a HEP.   Time 4   Period Weeks   Status New     PT LONG TERM GOAL #2   Title Full active left knee extension.   Time 4   Period Weeks   Status New     PT LONG TERM GOAL #3   Title Active left knee flexion to 115 degrees+ so the patient can perform functional tasks and do so with pain not > 2-3/10.   Time 4   Period Weeks   Status New     PT LONG TERM GOAL #4   Title Decrease edema to within 2 cms of non-affected side to assist with pain reduction and range of motion gains.   Time 4   Period Weeks   Status New     PT LONG TERM GOAL #5   Title Perform a reciprocating stair gait with one railing with pain not >  2-3/10.   Time 4   Period Weeks   Status New     PT LONG TERM GOAL #6   Title Increase left knee strength to a solid 4+/5 to provide good stability for accomplishment of functional activities   Time 4   Period Weeks   Status New               Plan - 03/11/16 1445    Clinical Impression Statement Patient presented in clinic with reports of discomfort and soreness but also difficulty in lifting LLE. Patient is ambulating with FWW at this time with post-surgical dressing taped over L knee incision. Patient experienced greater medial L knee pain prior to treatment but by end of treatment reported lateral L knee discomfort from exercises. Patient able to slowly and carefully complete directed exercises with multimodal cueing for proper technique. Increased edema visibly observed throughout patient's R knee with ecchymosis noted along posteriolateral L thigh and knee. Normal vasopneumatic response following removal of the modality in which patient wishes to not be placed on coldest setting. Patient required assist throughout treatment in which to elevate LLE by PTA.   Rehab Potential Good   PT Frequency 3x / week   PT Duration 4 weeks   PT Treatment/Interventions ADLs/Self Care Home Management;Cryotherapy;Electrical Stimulation;Gait training;Stair training;Functional mobility training;Therapeutic activities;Therapeutic exercise;Neuromuscular re-education;Patient/family education;Passive range of motion;Manual techniques;Vasopneumatic Device   PT Next Visit Plan Continue with L TKR protcol with edema control and modalities as symptoms dictate per MPT POC.   Consulted and Agree with Plan of Care Patient      Patient will benefit from skilled therapeutic intervention in order to improve the following deficits and impairments:  Abnormal gait, Pain, Increased edema, Decreased activity tolerance, Decreased range of motion, Decreased strength, Decreased mobility  Visit Diagnosis: Acute pain of  left knee  Stiffness of left knee, not elsewhere classified  Localized edema  Muscle weakness (generalized)     Problem List Patient Active Problem List   Diagnosis Date Noted  . OA (osteoarthritis) of knee 03/04/2016  . Acute medial meniscal tear 03/06/2014  . Cold hands and feet 08/23/2012  Wynelle Fanny, PTA 03/11/2016, 2:50 PM  [facility] [address] [place], [place], [address] Phone: [phone]   Fax:  [phone]  Name: Thomas Pham MRN: [id number] Date of Birth: 1955-04-10

## 2016-03-12 ENCOUNTER — Ambulatory Visit: Payer: BLUE CROSS/BLUE SHIELD | Admitting: Physical Therapy

## 2016-03-12 DIAGNOSIS — R600 Localized edema: Secondary | ICD-10-CM

## 2016-03-12 DIAGNOSIS — M25562 Pain in left knee: Secondary | ICD-10-CM | POA: Diagnosis not present

## 2016-03-12 DIAGNOSIS — M25662 Stiffness of left knee, not elsewhere classified: Secondary | ICD-10-CM

## 2016-03-12 DIAGNOSIS — M6281 Muscle weakness (generalized): Secondary | ICD-10-CM

## 2016-03-12 NOTE — Therapy (Signed)
[facility] [place], [place], [address] Phone: [phone]   Fax:  [phone]  Physical Therapy Treatment  Patient Details  Name: RITA DEWINTER MRN: [id number] Date of Birth: 1955-12-10 Referring Provider: Gaynelle Arabian MD.  Encounter Date: 03/12/2016      PT End of Session - 03/12/16 1643    Visit Number 3   Number of Visits 12   Date for PT Re-Evaluation 04/21/16   PT Start Time 0402      Past Medical History:  Diagnosis Date  . Allergy   . Arthritis    osteo  . Chronic kidney disease   . Difficulty sleeping   . GERD (gastroesophageal reflux disease)    takes nexium for acid reflux as needed  . History of gout   . History of hiatal hernia    umbilical hernia  . History of kidney stones   . Hyperlipidemia   . Hypertension   . Hypothyroidism   . Low back pain   . Medial meniscus tear    LEFT  . Organic impotence   . Penile cyst   . Sleep apnea    not using mask d/t preference  . Thyroid disease     Past Surgical History:  Procedure Laterality Date  . KNEE ARTHROSCOPY Left 03/07/2014   Procedure: LEFT ARTHROSCOPY KNEE WITH MEDIAL MENISCECTOMY  DEBRIDEMENT AND CHONDROPLASTY ;  Surgeon: Gearlean Alf, MD;  Location: [facility];  Service: Orthopedics;  Laterality: Left;  . THYROIDECTOMY Right 2013  . TONSILLECTOMY     childhood  . TOTAL KNEE ARTHROPLASTY Left 03/04/2016   Procedure: LEFT TOTAL KNEE ARTHROPLASTY;  Surgeon: Gaynelle Arabian, MD;  Location: [facility];  Service: Orthopedics;  Laterality: Left;    There were no vitals filed for this visit.      Subjective Assessment - 03/12/16 1644    Subjective I did a lot at home including 2 loads of laundry and up and down stairs.  I would like to go easy today.   Patient Stated Goals Get knee back to normal.   Pain Score 8    Pain Location Knee   Pain Orientation Left   Pain Descriptors / Indicators Sore;Discomfort   Pain Type Surgical pain   Pain  Frequency Constant                         [facility] Adult PT Treatment/Exercise - 03/12/16 0001      Exercises   Exercises Ankle     Modalities   Modalities Vasopneumatic     Vasopneumatic   Number Minutes Vasopneumatic  15 minutes   Vasopnuematic Location  --  Left knee.   Vasopneumatic Pressure Medium     Manual Therapy   Manual Therapy Passive ROM   Passive ROM PROM to patient tolerance into flexion and extension x 5 minutes.     Ankle Exercises: Standing   Other Standing Ankle Exercises Rockerboard in paralle;l bars x 3 minutes.                     PT Long Term Goals - 03/10/16 1716      PT LONG TERM GOAL #1   Title Independent with a HEP.   Time 4   Period Weeks   Status New     PT LONG TERM GOAL #2   Title Full active left knee extension.   Time 4   Period Weeks   Status  New     PT LONG TERM GOAL #3   Title Active left knee flexion to 115 degrees+ so the patient can perform functional tasks and do so with pain not > 2-3/10.   Time 4   Period Weeks   Status New     PT LONG TERM GOAL #4   Title Decrease edema to within 2 cms of non-affected side to assist with pain reduction and range of motion gains.   Time 4   Period Weeks   Status New     PT LONG TERM GOAL #5   Title Perform a reciprocating stair gait with one railing with pain not > 2-3/10.   Time 4   Period Weeks   Status New     PT LONG TERM GOAL #6   Title Increase left knee strength to a solid 4+/5 to provide good stability for accomplishment of functional activities   Time 4   Period Weeks   Status New               Plan - 03/11/16 1445    Clinical Impression Statement Patient presented in clinic with reports of discomfort and soreness but also difficulty in lifting LLE. Patient is ambulating with FWW at this time with post-surgical dressing taped over L knee incision. Patient experienced greater medial L knee pain prior to treatment but by end of  treatment reported lateral L knee discomfort from exercises. Patient able to slowly and carefully complete directed exercises with multimodal cueing for proper technique. Increased edema visibly observed throughout patient's R knee with ecchymosis noted along posteriolateral L thigh and knee. Normal vasopneumatic response following removal of the modality in which patient wishes to not be placed on coldest setting. Patient required assist throughout treatment in which to elevate LLE by PTA.   Rehab Potential Good   PT Frequency 3x / week   PT Duration 4 weeks   PT Treatment/Interventions ADLs/Self Care Home Management;Cryotherapy;Electrical Stimulation;Gait training;Stair training;Functional mobility training;Therapeutic activities;Therapeutic exercise;Neuromuscular re-education;Patient/family education;Passive range of motion;Manual techniques;Vasopneumatic Device   PT Next Visit Plan Continue with L TKR protcol with edema control and modalities as symptoms dictate per MPT POC.   Consulted and Agree with Plan of Care Patient      Patient will benefit from skilled therapeutic intervention in order to improve the following deficits and impairments:     Visit Diagnosis: Acute pain of left knee  Stiffness of left knee, not elsewhere classified  Localized edema  Muscle weakness (generalized)     Problem List Patient Active Problem List   Diagnosis Date Noted  . OA (osteoarthritis) of knee 03/04/2016  . Acute medial meniscal tear 03/06/2014  . Cold hands and feet 08/23/2012    Malijah Lietz, [place] 03/12/2016, 4:48 PM  [facility] [address] [place], [place], [address] Phone: [phone]   Fax:  [phone]  Name: DANYLO BIERL MRN: [id number] Date of Birth: October 08, 1955

## 2016-03-16 ENCOUNTER — Encounter: Payer: Self-pay | Admitting: Physical Therapy

## 2016-03-16 ENCOUNTER — Ambulatory Visit: Payer: BLUE CROSS/BLUE SHIELD | Admitting: Physical Therapy

## 2016-03-16 DIAGNOSIS — R600 Localized edema: Secondary | ICD-10-CM

## 2016-03-16 DIAGNOSIS — M25662 Stiffness of left knee, not elsewhere classified: Secondary | ICD-10-CM

## 2016-03-16 DIAGNOSIS — M25562 Pain in left knee: Secondary | ICD-10-CM | POA: Diagnosis not present

## 2016-03-16 DIAGNOSIS — M6281 Muscle weakness (generalized): Secondary | ICD-10-CM

## 2016-03-16 NOTE — Therapy (Signed)
[facility] [place], [place], [address] Phone: [phone]   Fax:  [phone]  Physical Therapy Treatment  Patient Details  Name: DETERRIUS HEINS MRN: [id number] Date of Birth: [birth year] Referring Provider: Gaynelle Arabian MD.  Encounter Date: 03/16/2016      PT End of Session - 03/16/16 1352    Visit Number 4   Number of Visits 12   Date for PT Re-Evaluation 04/21/16   PT Start Time 1314   PT Stop Time 1409   PT Time Calculation (min) 55 min   Activity Tolerance Patient tolerated treatment well   Behavior During Therapy [facility] for tasks assessed/performed      Past Medical History:  Diagnosis Date  . Allergy   . Arthritis    osteo  . Chronic kidney disease   . Difficulty sleeping   . GERD (gastroesophageal reflux disease)    takes nexium for acid reflux as needed  . History of gout   . History of hiatal hernia    umbilical hernia  . History of kidney stones   . Hyperlipidemia   . Hypertension   . Hypothyroidism   . Low back pain   . Medial meniscus tear    LEFT  . Organic impotence   . Penile cyst   . Sleep apnea    not using mask d/t preference  . Thyroid disease     Past Surgical History:  Procedure Laterality Date  . KNEE ARTHROSCOPY Left 03/07/2014   Procedure: LEFT ARTHROSCOPY KNEE WITH MEDIAL MENISCECTOMY  DEBRIDEMENT AND CHONDROPLASTY ;  Surgeon: Gearlean Alf, MD;  Location: [facility];  Service: Orthopedics;  Laterality: Left;  . THYROIDECTOMY Right 2013  . TONSILLECTOMY     childhood  . TOTAL KNEE ARTHROPLASTY Left 03/04/2016   Procedure: LEFT TOTAL KNEE ARTHROPLASTY;  Surgeon: Gaynelle Arabian, MD;  Location: [facility];  Service: Orthopedics;  Laterality: Left;    There were no vitals filed for this visit.      Subjective Assessment - 03/16/16 1317    Subjective Patient reported feeling better after last week, he motified some exercises and that has helped decrease his pain and swelling.    Pertinent History H/o LBP.   Patient Stated Goals Get knee back to normal.   Currently in Pain? Yes   Pain Score 6    Pain Location Knee   Pain Orientation Left   Pain Descriptors / Indicators Tightness;Sore   Pain Type Surgical pain   Pain Onset 1 to 4 weeks ago   Pain Frequency Constant   Aggravating Factors  ROM or increased activity   Pain Relieving Factors at rest            [facility] PT Assessment - 03/16/16 0001      ROM / Strength   AROM / PROM / Strength AROM;PROM     AROM   AROM Assessment Site Knee   Right/Left Knee Left   Left Knee Extension -19   Left Knee Flexion 76     PROM   PROM Assessment Site Knee   Right/Left Knee Left   Left Knee Extension -15   Left Knee Flexion 87                     [facility] Adult PT Treatment/Exercise - 03/16/16 0001      Knee/Hip Exercises: Stretches   Knee: Self-Stretch to increase Flexion Left;3 reps;30 seconds     Knee/Hip Exercises: Aerobic   Nustep  L4 x10 min UE/LE activity (seat 9)     Knee/Hip Exercises: Standing   Rocker Board 2 minutes     Vasopneumatic   Number Minutes Vasopneumatic  15 minutes   Vasopnuematic Location  Knee   Vasopneumatic Pressure Medium     Manual Therapy   Manual Therapy Passive ROM   Passive ROM PROM to left knee for flexion/ext with gentle low holds                     PT Long Term Goals - 03/16/16 1354      PT LONG TERM GOAL #1   Title Independent with a HEP.   Time 4   Period Weeks   Status On-going     PT LONG TERM GOAL #2   Title Full active left knee extension.   Time 4   Period Weeks   Status On-going  AROM -19 degrees 03/16/16     PT LONG TERM GOAL #3   Title Active left knee flexion to 115 degrees+ so the patient can perform functional tasks and do so with pain not > 2-3/10.   Time 4   Period Weeks   Status On-going  AROM 76 degrees 03/16/16     PT LONG TERM GOAL #4   Title Decrease edema to within 2 cms of non-affected side to assist  with pain reduction and range of motion gains.   Time 4   Period Weeks   Status On-going     PT LONG TERM GOAL #5   Title Perform a reciprocating stair gait with one railing with pain not > 2-3/10.   Time 4   Period Weeks   Status On-going     PT LONG TERM GOAL #6   Title Increase left knee strength to a solid 4+/5 to provide good stability for accomplishment of functional activities   Time 4   Period Weeks   Status On-going               Plan - 03/16/16 1356    Clinical Impression Statement Patient tolerated treatment well today. Patient has improved ROM for left knee flexion and ext active and passively. Patient has reported doing too much at home which oncrease pain and swelling then rested knee with less exercises and that seemed to improve knee. Educated patient on getle self stretches daily to increase ROM, elevation with ice for swelling and rest when needed to avoid flare ups of pain and swelling. Patient has difficulty with lifting knee in and out of bed due to weakness. Patient current goals ongoing due to pain, edema strength and ROM deficts.   Rehab Potential Good   PT Frequency 3x / week   PT Duration 4 weeks   PT Treatment/Interventions ADLs/Self Care Home Management;Cryotherapy;Electrical Stimulation;Gait training;Stair training;Functional mobility training;Therapeutic activities;Therapeutic exercise;Neuromuscular re-education;Patient/family education;Passive range of motion;Manual techniques;Vasopneumatic Device   PT Next Visit Plan Continue with L TKR protcol with edema control and modalities PRN (MD. Maureen Ralphs 03/17/15)   Consulted and Agree with Plan of Care Patient      Patient will benefit from skilled therapeutic intervention in order to improve the following deficits and impairments:  Abnormal gait, Pain, Increased edema, Decreased activity tolerance, Decreased range of motion, Decreased strength, Decreased mobility  Visit Diagnosis: Acute pain of left  knee  Stiffness of left knee, not elsewhere classified  Localized edema  Muscle weakness (generalized)     Problem List Patient Active Problem List   Diagnosis Date Noted  .  OA (osteoarthritis) of knee 03/04/2016  . Acute medial meniscal tear 03/06/2014  . Cold hands and feet 08/23/2012    APPLEGATE, [place], PTA 03/16/2016, 3:26 PM [place] App[place] MPT [facility] [address] [place], [place], [address] Phone: [phone]   Fax:  [phone]  Name: EL PENSINGER MRN: [id number] Date of Birth: 22-Jun-1955

## 2016-03-18 ENCOUNTER — Ambulatory Visit: Payer: BLUE CROSS/BLUE SHIELD | Admitting: Physical Therapy

## 2016-03-18 DIAGNOSIS — R600 Localized edema: Secondary | ICD-10-CM

## 2016-03-18 DIAGNOSIS — M25562 Pain in left knee: Secondary | ICD-10-CM

## 2016-03-18 DIAGNOSIS — M25662 Stiffness of left knee, not elsewhere classified: Secondary | ICD-10-CM

## 2016-03-18 DIAGNOSIS — M6281 Muscle weakness (generalized): Secondary | ICD-10-CM

## 2016-03-18 NOTE — Therapy (Signed)
[facility] [place], [place], [address] Phone: [phone]   Fax:  [phone]  Physical Therapy Treatment  Patient Details  Name: Thomas Pham MRN: [id number] Date of Birth: May 24, 1955 Referring Provider: Gaynelle Arabian MD.  Encounter Date: 03/18/2016      PT End of Session - 03/18/16 1600    Visit Number 5   Number of Visits 12   Date for PT Re-Evaluation 04/21/16   PT Start Time 0317   Activity Tolerance Patient tolerated treatment well   Behavior During Therapy [facility] for tasks assessed/performed      Past Medical History:  Diagnosis Date  . Allergy   . Arthritis    osteo  . Chronic kidney disease   . Difficulty sleeping   . GERD (gastroesophageal reflux disease)    takes nexium for acid reflux as needed  . History of gout   . History of hiatal hernia    umbilical hernia  . History of kidney stones   . Hyperlipidemia   . Hypertension   . Hypothyroidism   . Low back pain   . Medial meniscus tear    LEFT  . Organic impotence   . Penile cyst   . Sleep apnea    not using mask d/t preference  . Thyroid disease     Past Surgical History:  Procedure Laterality Date  . KNEE ARTHROSCOPY Left 03/07/2014   Procedure: LEFT ARTHROSCOPY KNEE WITH MEDIAL MENISCECTOMY  DEBRIDEMENT AND CHONDROPLASTY ;  Surgeon: Gearlean Alf, MD;  Location: [facility];  Service: Orthopedics;  Laterality: Left;  . THYROIDECTOMY Right 2013  . TONSILLECTOMY     childhood  . TOTAL KNEE ARTHROPLASTY Left 03/04/2016   Procedure: LEFT TOTAL KNEE ARTHROPLASTY;  Surgeon: Gaynelle Arabian, MD;  Location: [facility];  Service: Orthopedics;  Laterality: Left;    There were no vitals filed for this visit.      Subjective Assessment - 03/18/16 1600    Subjective Dr. was pleased but said I was a litle behind.     Treatment:  Nustep level 4 moving forward x 3 to increase flexion x 20 minutes f/b Rockerboard x 4 minutes f/b 12 inch box lunges (3  minutes) f/b SAQ's facilitated with VMS to left quadriceps x 15 minutes (10 sec extension holds and 10 sec rest) f/b constant pre-mod e'stim at 80-150 Hz to patient's left medial joint line region with medium vasopneumatic x 20 minutes.  Excellent job today.                                  PT Long Term Goals - 03/16/16 1354      PT LONG TERM GOAL #1   Title Independent with a HEP.   Time 4   Period Weeks   Status On-going     PT LONG TERM GOAL #2   Title Full active left knee extension.   Time 4   Period Weeks   Status On-going  AROM -19 degrees 03/16/16     PT LONG TERM GOAL #3   Title Active left knee flexion to 115 degrees+ so the patient can perform functional tasks and do so with pain not > 2-3/10.   Time 4   Period Weeks   Status On-going  AROM 76 degrees 03/16/16     PT LONG TERM GOAL #4   Title Decrease edema to within 2 cms of non-affected side  to assist with pain reduction and range of motion gains.   Time 4   Period Weeks   Status On-going     PT LONG TERM GOAL #5   Title Perform a reciprocating stair gait with one railing with pain not > 2-3/10.   Time 4   Period Weeks   Status On-going     PT LONG TERM GOAL #6   Title Increase left knee strength to a solid 4+/5 to provide good stability for accomplishment of functional activities   Time 4   Period Weeks   Status On-going             Patient will benefit from skilled therapeutic intervention in order to improve the following deficits and impairments:  Abnormal gait, Pain, Increased edema, Decreased activity tolerance, Decreased range of motion, Decreased strength, Decreased mobility  Visit Diagnosis: Acute pain of left knee  Stiffness of left knee, not elsewhere classified  Localized edema  Muscle weakness (generalized)     Problem List Patient Active Problem List   Diagnosis Date Noted  . OA (osteoarthritis) of knee 03/04/2016  . Acute medial meniscal tear  03/06/2014  . Cold hands and feet 08/23/2012    Genice Kimberlin, [place] MPT 03/18/2016, 4:28 PM  [facility] [address] [place], [place], [address] Phone: [phone]   Fax:  [phone]  Name: JEROD BATTEN MRN: [id number] Date of Birth: 1955/03/12

## 2016-03-19 ENCOUNTER — Ambulatory Visit: Payer: BLUE CROSS/BLUE SHIELD | Attending: Orthopedic Surgery | Admitting: Physical Therapy

## 2016-03-19 DIAGNOSIS — R600 Localized edema: Secondary | ICD-10-CM | POA: Insufficient documentation

## 2016-03-19 DIAGNOSIS — M25562 Pain in left knee: Secondary | ICD-10-CM | POA: Insufficient documentation

## 2016-03-19 DIAGNOSIS — M25662 Stiffness of left knee, not elsewhere classified: Secondary | ICD-10-CM | POA: Insufficient documentation

## 2016-03-19 DIAGNOSIS — M6281 Muscle weakness (generalized): Secondary | ICD-10-CM

## 2016-03-19 NOTE — Therapy (Signed)
[facility] [place], [place], [address] Phone: [phone]   Fax:  [phone]  Physical Therapy Treatment  Patient Details  Name: Thomas Pham MRN: [id number] Date of Birth: December 15, 1955 Referring Provider: Gaynelle Arabian MD.  Encounter Date: 03/19/2016      PT End of Session - 03/19/16 1611    Visit Number 6   Number of Visits 12   Date for PT Re-Evaluation 04/21/16   PT Start Time 0315   Activity Tolerance Patient tolerated treatment well   Behavior During Therapy [facility] for tasks assessed/performed      Past Medical History:  Diagnosis Date  . Allergy   . Arthritis    osteo  . Chronic kidney disease   . Difficulty sleeping   . GERD (gastroesophageal reflux disease)    takes nexium for acid reflux as needed  . History of gout   . History of hiatal hernia    umbilical hernia  . History of kidney stones   . Hyperlipidemia   . Hypertension   . Hypothyroidism   . Low back pain   . Medial meniscus tear    LEFT  . Organic impotence   . Penile cyst   . Sleep apnea    not using mask d/t preference  . Thyroid disease     Past Surgical History:  Procedure Laterality Date  . KNEE ARTHROSCOPY Left 03/07/2014   Procedure: LEFT ARTHROSCOPY KNEE WITH MEDIAL MENISCECTOMY  DEBRIDEMENT AND CHONDROPLASTY ;  Surgeon: Gearlean Alf, MD;  Location: [facility];  Service: Orthopedics;  Laterality: Left;  . THYROIDECTOMY Right 2013  . TONSILLECTOMY     childhood  . TOTAL KNEE ARTHROPLASTY Left 03/04/2016   Procedure: LEFT TOTAL KNEE ARTHROPLASTY;  Surgeon: Gaynelle Arabian, MD;  Location: [facility];  Service: Orthopedics;  Laterality: Left;    There were no vitals filed for this visit.      Subjective Assessment - 03/18/16 1600    Subjective Dr. was pleased but said I was a litle behind.    Treatment:  Nustep level 4 x 15 minutes moving forward x 2 to increase flexion f/b a sustained left knee extension x 5 minutes f/b SAQ's  facilitated with VMS x 10 minutes (10 sec extension holds and 10 sec rests) f/b IFC at 1-10 x 15 minutes.                                    PT Long Term Goals - 03/16/16 1354      PT LONG TERM GOAL #1   Title Independent with a HEP.   Time 4   Period Weeks   Status On-going     PT LONG TERM GOAL #2   Title Full active left knee extension.   Time 4   Period Weeks   Status On-going  AROM -19 degrees 03/16/16     PT LONG TERM GOAL #3   Title Active left knee flexion to 115 degrees+ so the patient can perform functional tasks and do so with pain not > 2-3/10.   Time 4   Period Weeks   Status On-going  AROM 76 degrees 03/16/16     PT LONG TERM GOAL #4   Title Decrease edema to within 2 cms of non-affected side to assist with pain reduction and range of motion gains.   Time 4   Period Weeks   Status On-going  PT LONG TERM GOAL #5   Title Perform a reciprocating stair gait with one railing with pain not > 2-3/10.   Time 4   Period Weeks   Status On-going     PT LONG TERM GOAL #6   Title Increase left knee strength to a solid 4+/5 to provide good stability for accomplishment of functional activities   Time 4   Period Weeks   Status On-going               Plan - 03/19/16 1611    Clinical Impression Statement Patient states he is having a bad today and wants to go easier.  Patient still has alot of swelling.  Recommended he ice and elevate 4 times daily and perform the prone hand 3 times a day due to his loss of left knee extension.  No goals met.   PT Treatment/Interventions ADLs/Self Care Home Management;Cryotherapy;Electrical Stimulation;Gait training;Stair training;Functional mobility training;Therapeutic activities;Therapeutic exercise;Neuromuscular re-education;Patient/family education;Passive range of motion;Manual techniques;Vasopneumatic Device      Patient will benefit from skilled therapeutic intervention in order to improve  the following deficits and impairments:  Abnormal gait, Pain, Increased edema, Decreased activity tolerance, Decreased range of motion, Decreased strength, Decreased mobility  Visit Diagnosis: Acute pain of left knee  Stiffness of left knee, not elsewhere classified  Localized edema  Muscle weakness (generalized)     Problem List Patient Active Problem List   Diagnosis Date Noted  . OA (osteoarthritis) of knee 03/04/2016  . Acute medial meniscal tear 03/06/2014  . Cold hands and feet 08/23/2012    Lennan Malone, [place] MPT 03/19/2016, 4:14 PM  [facility] [address] [place], [place], [address] Phone: [phone]   Fax:  [phone]  Name: Thomas Pham MRN: [id number] Date of Birth: May 24, 1955

## 2016-03-23 ENCOUNTER — Ambulatory Visit: Payer: BLUE CROSS/BLUE SHIELD | Admitting: Physical Therapy

## 2016-03-23 DIAGNOSIS — M6281 Muscle weakness (generalized): Secondary | ICD-10-CM

## 2016-03-23 DIAGNOSIS — R600 Localized edema: Secondary | ICD-10-CM

## 2016-03-23 DIAGNOSIS — M25562 Pain in left knee: Secondary | ICD-10-CM | POA: Diagnosis not present

## 2016-03-23 DIAGNOSIS — M25662 Stiffness of left knee, not elsewhere classified: Secondary | ICD-10-CM

## 2016-03-23 NOTE — Therapy (Signed)
[facility] [place], [place], [address] Phone: [phone]   Fax:  [phone]  Physical Therapy Treatment  Patient Details  Name: Thomas Pham MRN: [id number] Date of Birth: 29-Sep-1955 Referring Provider: Gaynelle Arabian MD.  Encounter Date: 03/23/2016      PT End of Session - 03/23/16 1521    Visit Number 7   Number of Visits 12   Date for PT Re-Evaluation 04/21/16   PT Start Time 0230      Past Medical History:  Diagnosis Date  . Allergy   . Arthritis    osteo  . Chronic kidney disease   . Difficulty sleeping   . GERD (gastroesophageal reflux disease)    takes nexium for acid reflux as needed  . History of gout   . History of hiatal hernia    umbilical hernia  . History of kidney stones   . Hyperlipidemia   . Hypertension   . Hypothyroidism   . Low back pain   . Medial meniscus tear    LEFT  . Organic impotence   . Penile cyst   . Sleep apnea    not using mask d/t preference  . Thyroid disease     Past Surgical History:  Procedure Laterality Date  . KNEE ARTHROSCOPY Left 03/07/2014   Procedure: LEFT ARTHROSCOPY KNEE WITH MEDIAL MENISCECTOMY  DEBRIDEMENT AND CHONDROPLASTY ;  Surgeon: Gearlean Alf, MD;  Location: [facility];  Service: Orthopedics;  Laterality: Left;  . THYROIDECTOMY Right 2013  . TONSILLECTOMY     childhood  . TOTAL KNEE ARTHROPLASTY Left 03/04/2016   Procedure: LEFT TOTAL KNEE ARTHROPLASTY;  Surgeon: Gaynelle Arabian, MD;  Location: [facility];  Service: Orthopedics;  Laterality: Left;    There were no vitals filed for this visit.      Subjective Assessment - 03/23/16 1522    Subjective The patient reports he has been able to do the prone hand at home for 10#.  Recommended he add 2# now.  His mother is also doing overpressure stretch at home to increase extension.  He has a h/o low back pain and has had a recent flare-up and would like to go a bit easier today.   Patient Stated Goals  Get knee back to normal.   Pain Score 6    Pain Location Knee   Pain Orientation Left   Pain Descriptors / Indicators Tightness;Sore   Pain Type Surgical pain   Pain Onset 1 to 4 weeks ago   Pain Frequency Constant    Treatment:  Level 5 moving forward x 3 to increase flexion x 15 minutes f/b rockerboard in parallel bars x 4 minutes f/b SAQ's facilitated with VMS x 11 minutes (10 sec extension holds and 10 sec rest) f/b IFC and medium vasopneumatic x 15 minutes.  Patient doing well.  Ambulating with axillary crutches today.                                  PT Long Term Goals - 03/16/16 1354      PT LONG TERM GOAL #1   Title Independent with a HEP.   Time 4   Period Weeks   Status On-going     PT LONG TERM GOAL #2   Title Full active left knee extension.   Time 4   Period Weeks   Status On-going  AROM -19 degrees 03/16/16  PT LONG TERM GOAL #3   Title Active left knee flexion to 115 degrees+ so the patient can perform functional tasks and do so with pain not > 2-3/10.   Time 4   Period Weeks   Status On-going  AROM 76 degrees 03/16/16     PT LONG TERM GOAL #4   Title Decrease edema to within 2 cms of non-affected side to assist with pain reduction and range of motion gains.   Time 4   Period Weeks   Status On-going     PT LONG TERM GOAL #5   Title Perform a reciprocating stair gait with one railing with pain not > 2-3/10.   Time 4   Period Weeks   Status On-going     PT LONG TERM GOAL #6   Title Increase left knee strength to a solid 4+/5 to provide good stability for accomplishment of functional activities   Time 4   Period Weeks   Status On-going             Patient will benefit from skilled therapeutic intervention in order to improve the following deficits and impairments:     Visit Diagnosis: No diagnosis found.     Problem List Patient Active Problem List   Diagnosis Date Noted  . OA (osteoarthritis) of knee  03/04/2016  . Acute medial meniscal tear 03/06/2014  . Cold hands and feet 08/23/2012    APPLE[place], [place] MPT 03/23/2016, 3:27 PM  [facility] [address] [place], [place], [address] Phone: [phone]   Fax:  [phone]  Name: Thomas Pham MRN: [id number] Date of Birth: December 19, 1955

## 2016-03-25 ENCOUNTER — Encounter: Payer: Self-pay | Admitting: Physical Therapy

## 2016-03-26 ENCOUNTER — Ambulatory Visit: Payer: BLUE CROSS/BLUE SHIELD | Admitting: Physical Therapy

## 2016-03-26 ENCOUNTER — Encounter: Payer: Self-pay | Admitting: Physical Therapy

## 2016-03-26 DIAGNOSIS — M25562 Pain in left knee: Secondary | ICD-10-CM | POA: Diagnosis not present

## 2016-03-26 DIAGNOSIS — M6281 Muscle weakness (generalized): Secondary | ICD-10-CM

## 2016-03-26 DIAGNOSIS — M25662 Stiffness of left knee, not elsewhere classified: Secondary | ICD-10-CM

## 2016-03-26 DIAGNOSIS — R600 Localized edema: Secondary | ICD-10-CM

## 2016-03-26 NOTE — Therapy (Signed)
[facility] [place], [place], [address] Phone: [phone]   Fax:  [phone]  Physical Therapy Treatment  Patient Details  Name: Thomas Pham MRN: [id number] Date of Birth: 06/07/1955 Referring Provider: Gaynelle Arabian MD.  Encounter Date: 03/26/2016      PT End of Session - 03/26/16 1513    Visit Number 8   Number of Visits 12   Date for PT Re-Evaluation 04/21/16   PT Start Time 1440   PT Stop Time 1525   PT Time Calculation (min) 45 min   Activity Tolerance Patient tolerated treatment well   Behavior During Therapy [facility] for tasks assessed/performed      Past Medical History:  Diagnosis Date  . Allergy   . Arthritis    osteo  . Chronic kidney disease   . Difficulty sleeping   . GERD (gastroesophageal reflux disease)    takes nexium for acid reflux as needed  . History of gout   . History of hiatal hernia    umbilical hernia  . History of kidney stones   . Hyperlipidemia   . Hypertension   . Hypothyroidism   . Low back pain   . Medial meniscus tear    LEFT  . Organic impotence   . Penile cyst   . Sleep apnea    not using mask d/t preference  . Thyroid disease     Past Surgical History:  Procedure Laterality Date  . KNEE ARTHROSCOPY Left 03/07/2014   Procedure: LEFT ARTHROSCOPY KNEE WITH MEDIAL MENISCECTOMY  DEBRIDEMENT AND CHONDROPLASTY ;  Surgeon: Gearlean Alf, MD;  Location: [facility];  Service: Orthopedics;  Laterality: Left;  . THYROIDECTOMY Right 2013  . TONSILLECTOMY     childhood  . TOTAL KNEE ARTHROPLASTY Left 03/04/2016   Procedure: LEFT TOTAL KNEE ARTHROPLASTY;  Surgeon: Gaynelle Arabian, MD;  Location: [facility];  Service: Orthopedics;  Laterality: Left;    There were no vitals filed for this visit.      Subjective Assessment - 03/26/16 1448    Subjective Patient arrived with reports of stiffness in knee, he has not worked on self stretching due to being sick the past few days   Pertinent History H/o LBP.   Patient Stated Goals Get knee back to normal.   Currently in Pain? Yes   Pain Score 6    Pain Location Knee   Pain Orientation Left   Pain Descriptors / Indicators Sore;Tightness   Pain Type Surgical pain   Pain Onset More than a month ago   Pain Frequency Constant   Aggravating Factors  prolong activity/ROM   Pain Relieving Factors at rest            [facility] PT Assessment - 03/26/16 0001      Circumferential Edema   Circumferential - Left  --  46 1/2 cm     AROM   AROM Assessment Site Knee   Right/Left Knee Left   Left Knee Extension -21   Left Knee Flexion 83     PROM   PROM Assessment Site Knee   Right/Left Knee Left   Left Knee Extension -15   Left Knee Flexion 97                     [facility] Adult PT Treatment/Exercise - 03/26/16 0001      Knee/Hip Exercises: Stretches   Knee: Self-Stretch to increase Flexion --     Knee/Hip Exercises: Aerobic   Nustep  L4 x10 min UE/LE activity (seat 9-7)     Modalities   Modalities Passenger transport manager Location left knee   Electrical Stimulation Action IFC   Electrical Stimulation Parameters 1-10hz  x71min   Electrical Stimulation Goals Pain;Edema     Vasopneumatic   Number Minutes Vasopneumatic  15 minutes   Vasopnuematic Location  Knee   Vasopneumatic Pressure Medium     Manual Therapy   Manual Therapy Passive ROM   Passive ROM PROM to left knee for flexion/ext with low load holds                      PT Long Term Goals - 03/16/16 1354      PT LONG TERM GOAL #1   Title Independent with a HEP.   Time 4   Period Weeks   Status On-going     PT LONG TERM GOAL #2   Title Full active left knee extension.   Time 4   Period Weeks   Status On-going  AROM -19 degrees 03/16/16     PT LONG TERM GOAL #3   Title Active left knee flexion to 115 degrees+ so the patient can perform functional tasks  and do so with pain not > 2-3/10.   Time 4   Period Weeks   Status On-going  AROM 76 degrees 03/16/16     PT LONG TERM GOAL #4   Title Decrease edema to within 2 cms of non-affected side to assist with pain reduction and range of motion gains.   Time 4   Period Weeks   Status On-going     PT LONG TERM GOAL #5   Title Perform a reciprocating stair gait with one railing with pain not > 2-3/10.   Time 4   Period Weeks   Status On-going     PT LONG TERM GOAL #6   Title Increase left knee strength to a solid 4+/5 to provide good stability for accomplishment of functional activities   Time 4   Period Weeks   Status On-going               Plan - 03/26/16 1515    Clinical Impression Statement Patient tolerated treatment well today. Patient had increased stiffness in knee for both knee flexion and ext. Today focused treatment on ROM only. Educated patient on self stretches daily to improve range in knee. Patient unable to meet any further goals due to pain, strength , edema and ROM deficts.   Rehab Potential Good   PT Frequency 3x / week   PT Duration 4 weeks   PT Treatment/Interventions ADLs/Self Care Home Management;Cryotherapy;Electrical Stimulation;Gait training;Stair training;Functional mobility training;Therapeutic activities;Therapeutic exercise;Neuromuscular re-education;Patient/family education;Passive range of motion;Manual techniques;Vasopneumatic Device   PT Next Visit Plan Continue with L TKR protcol with edema control and modalities PRN (MD. Maureen Ralphs 04/07/16)   Consulted and Agree with Plan of Care Patient      Patient will benefit from skilled therapeutic intervention in order to improve the following deficits and impairments:  Abnormal gait, Pain, Increased edema, Decreased activity tolerance, Decreased range of motion, Decreased strength, Decreased mobility  Visit Diagnosis: Acute pain of left knee  Stiffness of left knee, not elsewhere classified  Localized  edema  Muscle weakness (generalized)     Problem List Patient Active Problem List   Diagnosis Date Noted  . OA (osteoarthritis) of knee 03/04/2016  . Acute medial meniscal tear 03/06/2014  .  Cold hands and feet 08/23/2012    Isabel Ardila P, PTA 03/26/2016, 3:26 PM  [facility] [place], [place], [address] Phone: [phone]   Fax:  [phone]  Name: Thomas Pham MRN: [id number] Date of Birth: [birth year]

## 2016-03-30 ENCOUNTER — Ambulatory Visit: Payer: BLUE CROSS/BLUE SHIELD | Admitting: Physical Therapy

## 2016-03-30 ENCOUNTER — Encounter: Payer: Self-pay | Admitting: Physical Therapy

## 2016-03-30 DIAGNOSIS — M25562 Pain in left knee: Secondary | ICD-10-CM

## 2016-03-30 DIAGNOSIS — M25662 Stiffness of left knee, not elsewhere classified: Secondary | ICD-10-CM

## 2016-03-30 DIAGNOSIS — M6281 Muscle weakness (generalized): Secondary | ICD-10-CM

## 2016-03-30 DIAGNOSIS — R600 Localized edema: Secondary | ICD-10-CM

## 2016-03-30 NOTE — Patient Instructions (Addendum)
Knee Extension Mobilization: Hang (Prone)    With table supporting thighs, place __1-2__ pound weight on right ankle. Hold as long as tolerable. Repeat ____ times per set. Do ____ sets per session. Do ____ sessions per day.  http://orth.exer.us/722   Copyright  VHI. All rights reserved.  Strengthening: Terminal Knee Extension (Supine)    With left knee over bolster, straighten knee by tightening muscles on top of thigh. Keep bottom of knee on bolster. Could add ankleweights (started with 4 lbs today) Repeat _10___ times per set. Do ___2_ sets per session. Do __2-3__ sessions per day. Hold for 3 seconds.  http://orth.exer.us/626   Copyright  VHI. All rights reserved.

## 2016-03-30 NOTE — Therapy (Signed)
[facility] [place], [place], [address] Phone: [phone]   Fax:  [phone]  Physical Therapy Treatment  Patient Details  Name: Thomas Pham MRN: [id number] Date of Birth: 1955/11/28 Referring Provider: Gaynelle Arabian MD.  Encounter Date: 03/30/2016      PT End of Session - 03/30/16 1355    Visit Number 9   Number of Visits 12   Date for PT Re-Evaluation 04/21/16   PT Start Time 1346   PT Stop Time 1445   PT Time Calculation (min) 59 min   Activity Tolerance Patient tolerated treatment well   Behavior During Therapy [facility] for tasks assessed/performed      Past Medical History:  Diagnosis Date  . Allergy   . Arthritis    osteo  . Chronic kidney disease   . Difficulty sleeping   . GERD (gastroesophageal reflux disease)    takes nexium for acid reflux as needed  . History of gout   . History of hiatal hernia    umbilical hernia  . History of kidney stones   . Hyperlipidemia   . Hypertension   . Hypothyroidism   . Low back pain   . Medial meniscus tear    LEFT  . Organic impotence   . Penile cyst   . Sleep apnea    not using mask d/t preference  . Thyroid disease     Past Surgical History:  Procedure Laterality Date  . KNEE ARTHROSCOPY Left 03/07/2014   Procedure: LEFT ARTHROSCOPY KNEE WITH MEDIAL MENISCECTOMY  DEBRIDEMENT AND CHONDROPLASTY ;  Surgeon: Gearlean Alf, MD;  Location: [facility];  Service: Orthopedics;  Laterality: Left;  . THYROIDECTOMY Right 2013  . TONSILLECTOMY     childhood  . TOTAL KNEE ARTHROPLASTY Left 03/04/2016   Procedure: LEFT TOTAL KNEE ARTHROPLASTY;  Surgeon: Gaynelle Arabian, MD;  Location: [facility];  Service: Orthopedics;  Laterality: Left;    There were no vitals filed for this visit.      Subjective Assessment - 03/30/16 1348    Subjective Reports that over the weekend he had increased sharp pain in his L knee and felt as if his knee had swelled more. Reports that he is  still having pain predominately in medial L knee. Patient states that today so far has been a good day.   Pertinent History H/o LBP.   Patient Stated Goals Get knee back to normal.   Currently in Pain? Yes   Pain Score 6    Pain Location Knee   Pain Orientation Left   Pain Descriptors / Indicators Sore;Discomfort   Pain Type Surgical pain   Pain Onset More than a month ago            [facility] PT Assessment - 03/30/16 0001      Assessment   Medical Diagnosis left total knee replacement.   Onset Date/Surgical Date 03/04/16   Next MD Visit 04/09/2016     Restrictions   Weight Bearing Restrictions No     Circumferential Edema   Circumferential - Right 41.4 cm   Circumferential - Left  45.3 cm     ROM / Strength   AROM / PROM / Strength AROM     AROM   Overall AROM  Deficits   AROM Assessment Site Knee   Right/Left Knee Left   Left Knee Extension -17   Left Knee Flexion 104  [facility] Adult PT Treatment/Exercise - 03/30/16 0001      Knee/Hip Exercises: Stretches   Passive Hamstring Stretch Left;3 reps;30 seconds     Knee/Hip Exercises: Aerobic   Nustep L5 x10 min, seat 9-7     Knee/Hip Exercises: Standing   Forward Lunges Left;1 set;15 reps;3 seconds   Hip Abduction AROM;Left;2 sets;10 reps;Knee straight   Rocker Board 2 minutes     Knee/Hip Exercises: Supine   Short Arc Quad Sets Strengthening;Left;2 sets;10 reps   Short Arc Quad Sets Limitations 4# wiht 3 second hold     Modalities   Modalities Passenger transport manager Location L knee   Printmaker Action IFC   Electrical Stimulation Parameters 1-10 hz x15 min   Electrical Stimulation Goals Pain;Edema     Vasopneumatic   Number Minutes Vasopneumatic  15 minutes   Vasopnuematic Location  Knee   Vasopneumatic Pressure Medium   Vasopneumatic Temperature  34     Manual Therapy   Manual Therapy Passive  ROM;Soft tissue mobilization   Soft tissue mobilization STW to superior most L knee incision secondary to increased tone in the distal Quad and around superior incision   Passive ROM PROM of L knee into flexion at 90/90 position and extension with holds at end range                PT Education - 03/30/16 1444    Education provided Yes   Education Details HEP- prone hang, SAQ   Person(s) Educated Patient   Methods Explanation;Verbal cues;Handout   Comprehension Verbalized understanding;Verbal cues required             PT Long Term Goals - 03/16/16 1354      PT LONG TERM GOAL #1   Title Independent with a HEP.   Time 4   Period Weeks   Status On-going     PT LONG TERM GOAL #2   Title Full active left knee extension.   Time 4   Period Weeks   Status On-going  AROM -19 degrees 03/16/16     PT LONG TERM GOAL #3   Title Active left knee flexion to 115 degrees+ so the patient can perform functional tasks and do so with pain not > 2-3/10.   Time 4   Period Weeks   Status On-going  AROM 76 degrees 03/16/16     PT LONG TERM GOAL #4   Title Decrease edema to within 2 cms of non-affected side to assist with pain reduction and range of motion gains.   Time 4   Period Weeks   Status On-going     PT LONG TERM GOAL #5   Title Perform a reciprocating stair gait with one railing with pain not > 2-3/10.   Time 4   Period Weeks   Status On-going     PT LONG TERM GOAL #6   Title Increase left knee strength to a solid 4+/5 to provide good stability for accomplishment of functional activities   Time 4   Period Weeks   Status On-going               Plan - 03/30/16 1437    Clinical Impression Statement Patient tolerated today's treatment much better today as edema is decreasing and LBP has decreased as well. Patient understanding to allow LBP to be his guide with standing lunges today. Patient continues to experience increased L medial knee sharp pains and strong  discomfort with PROM of L knee medially with PROM into flexion. L Quad weakness noted today and SAQ was conducted in hopes of strengthening L Quad thus improving L knee extension. Patient educated that he could try adding 1-2# ankleweights to prone hangs that were given verbally in previous sessions to increase L knee extension as well as add SAQ at home. AROM L knee measured as 17-104 deg today in supine.  Patient provided official HEP for SAQ and prone hangs with instruction that patient could add ankleweights.   Rehab Potential Good   PT Frequency 3x / week   PT Duration 4 weeks   PT Treatment/Interventions ADLs/Self Care Home Management;Cryotherapy;Electrical Stimulation;Gait training;Stair training;Functional mobility training;Therapeutic activities;Therapeutic exercise;Neuromuscular re-education;Patient/family education;Passive range of motion;Manual techniques;Vasopneumatic Device   PT Next Visit Plan Continue with L TKR protcol with edema control and modalities PRN (MD. Maureen Ralphs 04/07/16)   PT Home Exercise Plan HEP- prone hangs, SAQ   Consulted and Agree with Plan of Care Patient      Patient will benefit from skilled therapeutic intervention in order to improve the following deficits and impairments:  Abnormal gait, Pain, Increased edema, Decreased activity tolerance, Decreased range of motion, Decreased strength, Decreased mobility  Visit Diagnosis: Acute pain of left knee  Stiffness of left knee, not elsewhere classified  Localized edema  Muscle weakness (generalized)     Problem List Patient Active Problem List   Diagnosis Date Noted  . OA (osteoarthritis) of knee 03/04/2016  . Acute medial meniscal tear 03/06/2014  . Cold hands and feet 08/23/2012    Wynelle Fanny, PTA 03/30/2016, 2:49 PM  [facility] [address] [place], [place], [address] Phone: [phone]   Fax:  [phone]  Name: CHAYSON GRALL MRN:  [id number] Date of Birth: 08-May-1955

## 2016-03-31 ENCOUNTER — Encounter: Payer: Self-pay | Admitting: Physical Therapy

## 2016-04-01 ENCOUNTER — Ambulatory Visit: Payer: BLUE CROSS/BLUE SHIELD | Admitting: Physical Therapy

## 2016-04-01 ENCOUNTER — Encounter: Payer: Self-pay | Admitting: Physical Therapy

## 2016-04-01 DIAGNOSIS — M6281 Muscle weakness (generalized): Secondary | ICD-10-CM

## 2016-04-01 DIAGNOSIS — M25562 Pain in left knee: Secondary | ICD-10-CM

## 2016-04-01 DIAGNOSIS — M25662 Stiffness of left knee, not elsewhere classified: Secondary | ICD-10-CM

## 2016-04-01 DIAGNOSIS — R600 Localized edema: Secondary | ICD-10-CM

## 2016-04-01 NOTE — Therapy (Signed)
[facility] [place], [place], [address] Phone: [phone]   Fax:  [phone]  Physical Therapy Treatment  Patient Details  Name: Thomas Pham MRN: [id number] Date of Birth: 02/14/1956 Referring Provider: Gaynelle Arabian MD.  Encounter Date: 04/01/2016      PT End of Session - 04/01/16 1434    Visit Number 10   Number of Visits 12   Date for PT Re-Evaluation 04/21/16   PT Start Time [id number]   PT Stop Time 1527   PT Time Calculation (min) 56 min   Activity Tolerance Patient tolerated treatment well   Behavior During Therapy [facility] for tasks assessed/performed      Past Medical History:  Diagnosis Date  . Allergy   . Arthritis    osteo  . Chronic kidney disease   . Difficulty sleeping   . GERD (gastroesophageal reflux disease)    takes nexium for acid reflux as needed  . History of gout   . History of hiatal hernia    umbilical hernia  . History of kidney stones   . Hyperlipidemia   . Hypertension   . Hypothyroidism   . Low back pain   . Medial meniscus tear    LEFT  . Organic impotence   . Penile cyst   . Sleep apnea    not using mask d/t preference  . Thyroid disease     Past Surgical History:  Procedure Laterality Date  . KNEE ARTHROSCOPY Left 03/07/2014   Procedure: LEFT ARTHROSCOPY KNEE WITH MEDIAL MENISCECTOMY  DEBRIDEMENT AND CHONDROPLASTY ;  Surgeon: Gearlean Alf, MD;  Location: [facility];  Service: Orthopedics;  Laterality: Left;  . THYROIDECTOMY Right 2013  . TONSILLECTOMY     childhood  . TOTAL KNEE ARTHROPLASTY Left 03/04/2016   Procedure: LEFT TOTAL KNEE ARTHROPLASTY;  Surgeon: Gaynelle Arabian, MD;  Location: [facility];  Service: Orthopedics;  Laterality: Left;    There were no vitals filed for this visit.      Subjective Assessment - 04/01/16 1433    Subjective Reports he could only get through one round of exercises due to LBP.   Pertinent History H/o LBP.   Patient Stated Goals Get knee  back to normal.   Currently in Pain? Yes   Pain Score 5    Pain Location Knee   Pain Orientation Left   Pain Descriptors / Indicators Sore   Pain Type Surgical pain   Pain Onset More than a month ago            [facility] PT Assessment - 04/01/16 0001      Assessment   Medical Diagnosis left total knee replacement.   Onset Date/Surgical Date 03/04/16   Next MD Visit 04/09/2016     Restrictions   Weight Bearing Restrictions No                     [facility] Adult PT Treatment/Exercise - 04/01/16 0001      Knee/Hip Exercises: Stretches   Passive Hamstring Stretch Left;3 reps;30 seconds   Other Knee/Hip Stretches Passive L ITB stretch 3x30 sec     Knee/Hip Exercises: Aerobic   Nustep L6 x10 min, seat 9-7     Knee/Hip Exercises: Standing   Forward Lunges Left;2 sets;10 reps;3 seconds   Hip Abduction AROM;Left;2 sets;10 reps;Knee straight   Forward Step Up Left;1 set;15 reps;Hand Hold: 2;Step Height: 6"   Rocker Board 2 minutes     Knee/Hip Exercises: Supine  Short Arc Chief Financial Officer sets;10 reps   Short Arc [employer] Limitations 4# with 3 sec hold     Modalities   Modalities Water quality scientist L knee   Chartered certified accountant IFC   Electrical Stimulation Parameters 1-10 hz x15 min   Electrical Stimulation Goals Pain;Edema     Vasopneumatic   Number Minutes Vasopneumatic  15 minutes   Vasopnuematic Location  Knee   Vasopneumatic Pressure Medium   Vasopneumatic Temperature  48     Manual Therapy   Manual Therapy Passive ROM;Soft tissue mobilization   Soft tissue mobilization STW to superior most L knee incision secondary to increased tone in the distal Quad and around superior incision   Passive ROM PROM of L knee into extension with holds at end range                     PT Long Term Goals - 04/01/16 1522      PT LONG TERM GOAL #1   Title  Independent with a HEP.   Time 4   Period Weeks   Status Achieved     PT LONG TERM GOAL #2   Title Full active left knee extension.   Time 4   Period Weeks   Status On-going  AROM -19 degrees 03/16/16     PT LONG TERM GOAL #3   Title Active left knee flexion to 115 degrees+ so the patient can perform functional tasks and do so with pain not > 2-3/10.   Time 4   Period Weeks   Status On-going  AROM 76 degrees 03/16/16     PT LONG TERM GOAL #4   Title Decrease edema to within 2 cms of non-affected side to assist with pain reduction and range of motion gains.   Time 4   Period Weeks   Status On-going     PT LONG TERM GOAL #5   Title Perform a reciprocating stair gait with one railing with pain not > 2-3/10.   Time 4   Period Weeks   Status On-going     PT LONG TERM GOAL #6   Title Increase left knee strength to a solid 4+/5 to provide good stability for accomplishment of functional activities   Time 4   Period Weeks   Status On-going               Plan - 04/01/16 1514    Clinical Impression Statement Patient tolerated today's treatment fairly well as he is still experiencing medial L knee pain. Patient also introduced to forward step ups to begin L Quad strengthening and SAQs also continued today. Tightness palpated in the L HS and ITB thus passive stretching completed with patient feeling more stretch in HS with ITB stretch. Patient experienced increased burning along L knee incision following PROM into extension. Normal modalities response noted following removal of the modalities.   Rehab Potential Good   PT Frequency 3x / week   PT Duration 4 weeks   PT Treatment/Interventions ADLs/Self Care Home Management;Cryotherapy;Electrical Stimulation;Gait training;Stair training;Functional mobility training;Therapeutic activities;Therapeutic exercise;Neuromuscular re-education;Patient/family education;Passive range of motion;Manual techniques;Vasopneumatic Device   PT Next  Visit Plan Continue with L TKR protcol with edema control and modalities PRN (MD. Maureen Ralphs 04/07/16)   PT Home Exercise Plan HEP- prone hangs, SAQ   Consulted and Agree with Plan of Care Patient      Patient will benefit from skilled therapeutic  intervention in order to improve the following deficits and impairments:  Abnormal gait, Pain, Increased edema, Decreased activity tolerance, Decreased range of motion, Decreased strength, Decreased mobility  Visit Diagnosis: Acute pain of left knee  Stiffness of left knee, not elsewhere classified  Localized edema  Muscle weakness (generalized)     Problem List Patient Active Problem List   Diagnosis Date Noted  . OA (osteoarthritis) of knee 03/04/2016  . Acute medial meniscal tear 03/06/2014  . Cold hands and feet 08/23/2012    Wynelle Fanny, PTA 04/01/2016, 3:29 PM  [facility] [address] [place], [place], [address] Phone: [phone]   Fax:  [phone]  Name: Thomas Pham MRN: [id number] Date of Birth: May 06, 1955

## 2016-04-02 ENCOUNTER — Encounter: Payer: Self-pay | Admitting: Physical Therapy

## 2016-04-06 ENCOUNTER — Ambulatory Visit: Payer: BLUE CROSS/BLUE SHIELD | Admitting: Physical Therapy

## 2016-04-06 ENCOUNTER — Encounter: Payer: Self-pay | Admitting: Physical Therapy

## 2016-04-06 DIAGNOSIS — M25562 Pain in left knee: Secondary | ICD-10-CM

## 2016-04-06 DIAGNOSIS — R600 Localized edema: Secondary | ICD-10-CM

## 2016-04-06 DIAGNOSIS — M6281 Muscle weakness (generalized): Secondary | ICD-10-CM

## 2016-04-06 DIAGNOSIS — M25662 Stiffness of left knee, not elsewhere classified: Secondary | ICD-10-CM

## 2016-04-06 NOTE — Therapy (Signed)
[facility] [place], [place], [address] Phone: [phone]   Fax:  [phone]  Physical Therapy Treatment  Patient Details  Name: Thomas Pham MRN: [id number] Date of Birth: [birth year] Referring Provider: Gaynelle Arabian MD.  Encounter Date: 04/06/2016      PT End of Session - 04/06/16 1407    Visit Number 11   Number of Visits 12   Date for PT Re-Evaluation 04/21/16   PT Start Time [id number]   PT Stop Time 1454   PT Time Calculation (min) 59 min   Activity Tolerance Patient tolerated treatment well   Behavior During Therapy [facility] for tasks assessed/performed      Past Medical History:  Diagnosis Date  . Allergy   . Arthritis    osteo  . Chronic kidney disease   . Difficulty sleeping   . GERD (gastroesophageal reflux disease)    takes nexium for acid reflux as needed  . History of gout   . History of hiatal hernia    umbilical hernia  . History of kidney stones   . Hyperlipidemia   . Hypertension   . Hypothyroidism   . Low back pain   . Medial meniscus tear    LEFT  . Organic impotence   . Penile cyst   . Sleep apnea    not using mask d/t preference  . Thyroid disease     Past Surgical History:  Procedure Laterality Date  . KNEE ARTHROSCOPY Left 03/07/2014   Procedure: LEFT ARTHROSCOPY KNEE WITH MEDIAL MENISCECTOMY  DEBRIDEMENT AND CHONDROPLASTY ;  Surgeon: Gearlean Alf, MD;  Location: [facility];  Service: Orthopedics;  Laterality: Left;  . THYROIDECTOMY Right 2013  . TONSILLECTOMY     childhood  . TOTAL KNEE ARTHROPLASTY Left 03/04/2016   Procedure: LEFT TOTAL KNEE ARTHROPLASTY;  Surgeon: Gaynelle Arabian, MD;  Location: [facility];  Service: Orthopedics;  Laterality: Left;    There were no vitals filed for this visit.      Subjective Assessment - 04/06/16 1400    Subjective Patient reported his knee had increased soreness after last visit and has not done any exercises/stretches since   Pertinent  History H/o LBP.   Patient Stated Goals Get knee back to normal.   Currently in Pain? Yes   Pain Score 6    Pain Location Knee   Pain Orientation Left   Pain Descriptors / Indicators Sore   Pain Type Surgical pain   Pain Onset More than a month ago   Pain Frequency Constant   Aggravating Factors  increased activity   Pain Relieving Factors at rest            [facility] PT Assessment - 04/06/16 0001      AROM   AROM Assessment Site Knee   Right/Left Knee Left   Left Knee Extension -21   Left Knee Flexion 94     PROM   PROM Assessment Site Knee   Right/Left Knee Left   Left Knee Extension -16   Left Knee Flexion 103                     [facility] Adult PT Treatment/Exercise - 04/06/16 0001      Knee/Hip Exercises: Aerobic   Stationary Bike x29min for ROM, unable to make full revolution   Nustep 70min L6 (seat8) for ROM     Electrical Stimulation   Electrical Stimulation Action IFC   Electrical Stimulation Parameters 1-10hz  x53min  Electrical Stimulation Goals Pain;Edema     Vasopneumatic   Number Minutes Vasopneumatic  15 minutes   Vasopnuematic Location  Knee   Vasopneumatic Pressure Medium     Manual Therapy   Manual Therapy Passive ROM;Soft tissue mobilization   Soft tissue mobilization STW to superior most L knee incision secondary to increased tone in the distal Quad and around superior incision   Passive ROM PROM of L knee into flexion and extension with holds at end range                     PT Long Term Goals - 04/01/16 1522      PT LONG TERM GOAL #1   Title Independent with a HEP.   Time 4   Period Weeks   Status Achieved     PT LONG TERM GOAL #2   Title Full active left knee extension.   Time 4   Period Weeks   Status On-going  AROM -19 degrees 03/16/16     PT LONG TERM GOAL #3   Title Active left knee flexion to 115 degrees+ so the patient can perform functional tasks and do so with pain not > 2-3/10.   Time 4   Period  Weeks   Status On-going  AROM 76 degrees 03/16/16     PT LONG TERM GOAL #4   Title Decrease edema to within 2 cms of non-affected side to assist with pain reduction and range of motion gains.   Time 4   Period Weeks   Status On-going     PT LONG TERM GOAL #5   Title Perform a reciprocating stair gait with one railing with pain not > 2-3/10.   Time 4   Period Weeks   Status On-going     PT LONG TERM GOAL #6   Title Increase left knee strength to a solid 4+/5 to provide good stability for accomplishment of functional activities   Time 4   Period Weeks   Status On-going               Plan - 04/06/16 1436    Clinical Impression Statement Patient progressing with all activies slowly due to lack of motion in left knee. Patient has decreased ROM after not doing self stretches in several days to reported soreness. Today focused on ROM and was able to improve ROM after STW and manual stretching. Patient current goals ongoing due to pain and ROM deficts. Educated patient on daily stretches to improve mobility and function.    Rehab Potential Good   PT Frequency 3x / week   PT Duration 4 weeks   PT Treatment/Interventions ADLs/Self Care Home Management;Cryotherapy;Electrical Stimulation;Gait training;Stair training;Functional mobility training;Therapeutic activities;Therapeutic exercise;Neuromuscular re-education;Patient/family education;Passive range of motion;Manual techniques;Vasopneumatic Device   PT Next Visit Plan Continue with L TKR protcol with edema control and modalities PRN (MD. Maureen Ralphs 04/09/16)      Patient will benefit from skilled therapeutic intervention in order to improve the following deficits and impairments:  Abnormal gait, Pain, Increased edema, Decreased activity tolerance, Decreased range of motion, Decreased strength, Decreased mobility  Visit Diagnosis: Acute pain of left knee  Stiffness of left knee, not elsewhere classified  Localized edema  Muscle  weakness (generalized)     Problem List Patient Active Problem List   Diagnosis Date Noted  . OA (osteoarthritis) of knee 03/04/2016  . Acute medial meniscal tear 03/06/2014  . Cold hands and feet 08/23/2012    DUNFORD, CHRISTINA P, PTA  04/06/2016, 2:54 PM  [facility] [place], [place], [address] Phone: [phone]   Fax:  [phone]  Name: CHAYTON ACERO MRN: [id number] Date of Birth: Aug 22, 1955

## 2016-04-07 ENCOUNTER — Ambulatory Visit: Payer: BLUE CROSS/BLUE SHIELD | Admitting: *Deleted

## 2016-04-07 DIAGNOSIS — M25562 Pain in left knee: Secondary | ICD-10-CM

## 2016-04-07 DIAGNOSIS — M25662 Stiffness of left knee, not elsewhere classified: Secondary | ICD-10-CM

## 2016-04-07 DIAGNOSIS — M6281 Muscle weakness (generalized): Secondary | ICD-10-CM

## 2016-04-07 DIAGNOSIS — R600 Localized edema: Secondary | ICD-10-CM

## 2016-04-07 NOTE — Therapy (Signed)
[facility] [place], [place], [address] Phone: [phone]   Fax:  [phone]  Physical Therapy Treatment  Patient Details  Name: Thomas Pham MRN: [id number] Date of Birth: 1955-05-17 Referring Provider: Gaynelle Arabian MD.  Encounter Date: 04/07/2016      PT End of Session - 04/07/16 1357    Visit Number 12   Number of Visits 12   Date for PT Re-Evaluation 04/21/16   PT Start Time [id number]   PT Stop Time 1445   PT Time Calculation (min) 60 min      Past Medical History:  Diagnosis Date  . Allergy   . Arthritis    osteo  . Chronic kidney disease   . Difficulty sleeping   . GERD (gastroesophageal reflux disease)    takes nexium for acid reflux as needed  . History of gout   . History of hiatal hernia    umbilical hernia  . History of kidney stones   . Hyperlipidemia   . Hypertension   . Hypothyroidism   . Low back pain   . Medial meniscus tear    LEFT  . Organic impotence   . Penile cyst   . Sleep apnea    not using mask d/t preference  . Thyroid disease     Past Surgical History:  Procedure Laterality Date  . KNEE ARTHROSCOPY Left 03/07/2014   Procedure: LEFT ARTHROSCOPY KNEE WITH MEDIAL MENISCECTOMY  DEBRIDEMENT AND CHONDROPLASTY ;  Surgeon: Gearlean Alf, MD;  Location: [facility];  Service: Orthopedics;  Laterality: Left;  . THYROIDECTOMY Right 2013  . TONSILLECTOMY     childhood  . TOTAL KNEE ARTHROPLASTY Left 03/04/2016   Procedure: LEFT TOTAL KNEE ARTHROPLASTY;  Surgeon: Gaynelle Arabian, MD;  Location: [facility];  Service: Orthopedics;  Laterality: Left;    There were no vitals filed for this visit.      Subjective Assessment - 04/07/16 1353    Subjective Doing better LT knee has less pain   Patient Stated Goals Get knee back to normal.   Currently in Pain? Yes   Pain Score 6    Pain Location Knee   Pain Orientation Left   Pain Descriptors / Indicators Sore   Pain Type Surgical pain   Pain  Onset More than a month ago                         [facility] Adult PT Treatment/Exercise - 04/07/16 0001      Knee/Hip Exercises: Aerobic   Nustep L6 x15 min, seat 9-7     Knee/Hip Exercises: Standing   Forward Lunges Left;2 sets;10 reps;3 seconds   Forward Step Up Left;Hand Hold: 2;Step Height: 6";2 sets;Step Height: 8";20 reps   Rocker Board 3 minutes  calf stretching     Modalities   Modalities Electrical Stimulation;Vasopneumatic     Electrical Stimulation   Electrical Stimulation Location L knee  IFC x 15 mins 1-10hz    Electrical Stimulation Goals Pain;Edema     Vasopneumatic   Number Minutes Vasopneumatic  15 minutes   Vasopnuematic Location  Knee   Vasopneumatic Pressure Medium   Vasopneumatic Temperature  34     Manual Therapy   Manual Therapy Passive ROM;Soft tissue mobilization   Passive ROM PROM of L knee into flexion and extension with holds at end range  PT Long Term Goals - 04/01/16 1522      PT LONG TERM GOAL #1   Title Independent with a HEP.   Time 4   Period Weeks   Status Achieved     PT LONG TERM GOAL #2   Title Full active left knee extension.   Time 4   Period Weeks   Status On-going  AROM -19 degrees 03/16/16     PT LONG TERM GOAL #3   Title Active left knee flexion to 115 degrees+ so the patient can perform functional tasks and do so with pain not > 2-3/10.   Time 4   Period Weeks   Status On-going  AROM 76 degrees 03/16/16     PT LONG TERM GOAL #4   Title Decrease edema to within 2 cms of non-affected side to assist with pain reduction and range of motion gains.   Time 4   Period Weeks   Status On-going     PT LONG TERM GOAL #5   Title Perform a reciprocating stair gait with one railing with pain not > 2-3/10.   Time 4   Period Weeks   Status On-going     PT LONG TERM GOAL #6   Title Increase left knee strength to a solid 4+/5 to provide good stability for accomplishment of  functional activities   Time 4   Period Weeks   Status On-going               Plan - 04/07/16 1355    Clinical Impression Statement Pt arrives today in good spirits and feels he is back to where he was before a set back.  He did well with step-ups and lunges today for strengthening, but manual focus was on extension.   Rehab Potential Good   PT Frequency 3x / week   PT Duration 4 weeks   PT Treatment/Interventions ADLs/Self Care Home Management;Cryotherapy;Electrical Stimulation;Gait training;Stair training;Functional mobility training;Therapeutic activities;Therapeutic exercise;Neuromuscular re-education;Patient/family education;Passive range of motion;Manual techniques;Vasopneumatic Device   PT Next Visit Plan Continue with L TKR protcol with edema control and modalities PRN (MD. Maureen Ralphs 04/09/16)  MD note tomorrow   PT Home Exercise Plan HEP- prone hangs, SAQ   Consulted and Agree with Plan of Care Patient      Patient will benefit from skilled therapeutic intervention in order to improve the following deficits and impairments:  Abnormal gait, Pain, Increased edema, Decreased activity tolerance, Decreased range of motion, Decreased strength, Decreased mobility  Visit Diagnosis: Acute pain of left knee  Stiffness of left knee, not elsewhere classified  Localized edema  Muscle weakness (generalized)     Problem List Patient Active Problem List   Diagnosis Date Noted  . OA (osteoarthritis) of knee 03/04/2016  . Acute medial meniscal tear 03/06/2014  . Cold hands and feet 08/23/2012    Thomas Pham,Thomas Pham, PTA 04/07/2016, 3:27 PM  [facility] [address] [place], [place], [address] Phone: [phone]   Fax:  [phone]  Name: Thomas Pham MRN: [id number] Date of Birth: [birth year]

## 2016-04-08 ENCOUNTER — Ambulatory Visit: Payer: BLUE CROSS/BLUE SHIELD | Admitting: Physical Therapy

## 2016-04-08 ENCOUNTER — Encounter: Payer: Self-pay | Admitting: Physical Therapy

## 2016-04-08 DIAGNOSIS — M25662 Stiffness of left knee, not elsewhere classified: Secondary | ICD-10-CM

## 2016-04-08 DIAGNOSIS — M25562 Pain in left knee: Secondary | ICD-10-CM

## 2016-04-08 DIAGNOSIS — M6281 Muscle weakness (generalized): Secondary | ICD-10-CM

## 2016-04-08 DIAGNOSIS — R600 Localized edema: Secondary | ICD-10-CM

## 2016-04-08 NOTE — Therapy (Signed)
[facility] [place], [place], [address] Phone: [phone]   Fax:  [phone]  Physical Therapy Treatment  Patient Details  Name: Thomas Pham MRN: [id number] Date of Birth: 1955/11/23 Referring Provider: Gaynelle Arabian MD.  Encounter Date: 04/08/2016      PT End of Session - 04/08/16 1444    Visit Number 13   Date for PT Re-Evaluation 04/21/16   PT Start Time 1400   PT Stop Time 1459   PT Time Calculation (min) 59 min   Activity Tolerance Patient tolerated treatment well   Behavior During Therapy [facility] for tasks assessed/performed      Past Medical History:  Diagnosis Date  . Allergy   . Arthritis    osteo  . Chronic kidney disease   . Difficulty sleeping   . GERD (gastroesophageal reflux disease)    takes nexium for acid reflux as needed  . History of gout   . History of hiatal hernia    umbilical hernia  . History of kidney stones   . Hyperlipidemia   . Hypertension   . Hypothyroidism   . Low back pain   . Medial meniscus tear    LEFT  . Organic impotence   . Penile cyst   . Sleep apnea    not using mask d/t preference  . Thyroid disease     Past Surgical History:  Procedure Laterality Date  . KNEE ARTHROSCOPY Left 03/07/2014   Procedure: LEFT ARTHROSCOPY KNEE WITH MEDIAL MENISCECTOMY  DEBRIDEMENT AND CHONDROPLASTY ;  Surgeon: Gearlean Alf, MD;  Location: [facility];  Service: Orthopedics;  Laterality: Left;  . THYROIDECTOMY Right 2013  . TONSILLECTOMY     childhood  . TOTAL KNEE ARTHROPLASTY Left 03/04/2016   Procedure: LEFT TOTAL KNEE ARTHROPLASTY;  Surgeon: Gaynelle Arabian, MD;  Location: [facility];  Service: Orthopedics;  Laterality: Left;    There were no vitals filed for this visit.      Subjective Assessment - 04/08/16 1412    Subjective Patient reported doing good today yet ongoing stiffness   Pertinent History H/o LBP.   Patient Stated Goals Get knee back to normal.   Currently in  Pain? Yes   Pain Score 5    Pain Location Knee   Pain Orientation Left   Pain Descriptors / Indicators Sore   Pain Type Surgical pain   Pain Onset More than a month ago   Pain Frequency Constant   Aggravating Factors  ROM   Pain Relieving Factors at rest            [facility] PT Assessment - 04/08/16 0001      Circumferential Edema   Circumferential - Left  45.5     AROM   AROM Assessment Site Knee   Right/Left Knee Left   Left Knee Extension -19   Left Knee Flexion 97     PROM   PROM Assessment Site Knee   Right/Left Knee Left   Left Knee Extension -12   Left Knee Flexion 108                     [facility] Adult PT Treatment/Exercise - 04/08/16 0001      Knee/Hip Exercises: Aerobic   Nustep L6 x15 min, seat 9-7     Electrical Stimulation   Electrical Stimulation Location L knee  IFC x 15 mins 1-10hz    Electrical Stimulation Goals Pain;Edema     Vasopneumatic   Number Minutes Vasopneumatic  15 minutes   Vasopnuematic Location  Knee   Vasopneumatic Pressure Medium     Manual Therapy   Manual Therapy Passive ROM;Soft tissue mobilization   Passive ROM PROM of L knee into flexion and extension with holds at end range                     PT Long Term Goals - 04/08/16 1518      PT LONG TERM GOAL #1   Title Independent with a HEP.   Time 4   Period Weeks   Status Achieved     PT LONG TERM GOAL #2   Title Full active left knee extension.   Time 4   Period Weeks   Status On-going  AROM -19 degrees 04/08/16     PT LONG TERM GOAL #3   Title Active left knee flexion to 115 degrees+ so the patient can perform functional tasks and do so with pain not > 2-3/10.   Time 4   Period Weeks   Status On-going  AROM 97 degrees 04/08/16     PT LONG TERM GOAL #4   Title Decrease edema to within 2 cms of non-affected side to assist with pain reduction and range of motion gains.   Time 4   Period Weeks   Status On-going     PT LONG TERM GOAL #5    Title Perform a reciprocating stair gait with one railing with pain not > 2-3/10.   Time 4   Period Weeks   Status On-going     PT LONG TERM GOAL #6   Title Increase left knee strength to a solid 4+/5 to provide good stability for accomplishment of functional activities   Time 4   Period Weeks   Status On-going               Plan - 04/08/16 1514    Clinical Impression Statement Patient tolerated treatment well today. Patient continues to arrive with complaints of stiffness in knee yet able to increase range after manual stretching. Patient able to tolerate light strengthening at this time. patient has palpable pain in medial left knee. Patient has ongoing edema in knee. Patient current goals ongoing due to ROM, edema, pain and strength deficts.    Rehab Potential Good   PT Frequency 3x / week   PT Duration 4 weeks   PT Treatment/Interventions ADLs/Self Care Home Management;Cryotherapy;Electrical Stimulation;Gait training;Stair training;Functional mobility training;Therapeutic activities;Therapeutic exercise;Neuromuscular re-education;Patient/family education;Passive range of motion;Manual techniques;Vasopneumatic Device   PT Next Visit Plan Continue with L TKR protcol with edema control and modalities PRN (MD. Maureen Ralphs 04/09/16)    Consulted and Agree with Plan of Care Patient      Patient will benefit from skilled therapeutic intervention in order to improve the following deficits and impairments:  Abnormal gait, Pain, Increased edema, Decreased activity tolerance, Decreased range of motion, Decreased strength, Decreased mobility  Visit Diagnosis: Acute pain of left knee  Stiffness of left knee, not elsewhere classified  Localized edema  Muscle weakness (generalized)     Problem List Patient Active Problem List   Diagnosis Date Noted  . OA (osteoarthritis) of knee 03/04/2016  . Acute medial meniscal tear 03/06/2014  . Cold hands and feet 08/23/2012    Ladean Raya, PTA 04/08/16 5:47 PM Mali Applegate MPT [facility] [address] [place], [place], [address] Phone: [phone]   Fax:  [phone]  Name: Thomas Pham MRN: [id number] Date of Birth:  03/02/1955   

## 2016-04-09 ENCOUNTER — Encounter: Payer: Self-pay | Admitting: Physical Therapy

## 2016-04-13 ENCOUNTER — Ambulatory Visit: Payer: BLUE CROSS/BLUE SHIELD | Admitting: Physical Therapy

## 2016-04-13 ENCOUNTER — Encounter: Payer: Self-pay | Admitting: Physical Therapy

## 2016-04-13 DIAGNOSIS — R600 Localized edema: Secondary | ICD-10-CM

## 2016-04-13 DIAGNOSIS — M25562 Pain in left knee: Secondary | ICD-10-CM

## 2016-04-13 DIAGNOSIS — M6281 Muscle weakness (generalized): Secondary | ICD-10-CM

## 2016-04-13 DIAGNOSIS — M25662 Stiffness of left knee, not elsewhere classified: Secondary | ICD-10-CM

## 2016-04-13 NOTE — Therapy (Signed)
[facility] [place], [place], [address] Phone: [phone]   Fax:  [phone]  Physical Therapy Treatment  Patient Details  Name: Thomas Pham MRN: [id number] Date of Birth: [birth year] Referring Provider: Gaynelle Arabian MD.  Encounter Date: 04/13/2016      PT End of Session - 04/13/16 1442    Visit Number 14   Number of Visits 24   Date for PT Re-Evaluation 05/08/16   PT Start Time 1400   PT Stop Time 1459   PT Time Calculation (min) 59 min   Activity Tolerance Patient tolerated treatment well   Behavior During Therapy [facility] for tasks assessed/performed      Past Medical History:  Diagnosis Date  . Allergy   . Arthritis    osteo  . Chronic kidney disease   . Difficulty sleeping   . GERD (gastroesophageal reflux disease)    takes nexium for acid reflux as needed  . History of gout   . History of hiatal hernia    umbilical hernia  . History of kidney stones   . Hyperlipidemia   . Hypertension   . Hypothyroidism   . Low back pain   . Medial meniscus tear    LEFT  . Organic impotence   . Penile cyst   . Sleep apnea    not using mask d/t preference  . Thyroid disease     Past Surgical History:  Procedure Laterality Date  . KNEE ARTHROSCOPY Left 03/07/2014   Procedure: LEFT ARTHROSCOPY KNEE WITH MEDIAL MENISCECTOMY  DEBRIDEMENT AND CHONDROPLASTY ;  Surgeon: Gearlean Alf, MD;  Location: [facility];  Service: Orthopedics;  Laterality: Left;  . THYROIDECTOMY Right 2013  . TONSILLECTOMY     childhood  . TOTAL KNEE ARTHROPLASTY Left 03/04/2016   Procedure: LEFT TOTAL KNEE ARTHROPLASTY;  Surgeon: Gaynelle Arabian, MD;  Location: [facility];  Service: Orthopedics;  Laterality: Left;    There were no vitals filed for this visit.      Subjective Assessment - 04/13/16 1405    Subjective Patient reported doing to much exercises at home on bike and walking which increased pain and soreness in left LE/Patient went to MD  and is to get a JAS brace for ext   Pertinent History H/o LBP.   Patient Stated Goals Get knee back to normal.   Currently in Pain? Yes   Pain Score 7    Pain Location Knee   Pain Orientation Left   Pain Descriptors / Indicators Sore   Pain Type Surgical pain   Pain Onset More than a month ago   Aggravating Factors  ROM or increased activity   Pain Relieving Factors at rest            [facility] PT Assessment - 04/13/16 0001      AROM   AROM Assessment Site Knee   Right/Left Knee Left   Left Knee Extension -17   Left Knee Flexion 100     PROM   Left Knee Extension -11   Left Knee Flexion 112                     [facility] Adult PT Treatment/Exercise - 04/13/16 0001      Knee/Hip Exercises: Aerobic   Nustep 33min L4     Knee/Hip Exercises: Standing   Rocker Board 2 minutes     Electrical Stimulation   Electrical Stimulation Location --   Electrical Stimulation Goals Other (comment)  no  ES per patient request     Vasopneumatic   Number Minutes Vasopneumatic  15 minutes   Vasopnuematic Location  Knee   Vasopneumatic Pressure Medium     Manual Therapy   Manual Therapy Passive ROM;Soft tissue mobilization   Passive ROM PROM of L knee into flexion and extension with holds at end range                     PT Long Term Goals - 04/08/16 1518      PT LONG TERM GOAL #1   Title Independent with a HEP.   Time 4   Period Weeks   Status Achieved     PT LONG TERM GOAL #2   Title Full active left knee extension.   Time 4   Period Weeks   Status On-going  AROM -19 degrees 04/08/16     PT LONG TERM GOAL #3   Title Active left knee flexion to 115 degrees+ so the patient can perform functional tasks and do so with pain not > 2-3/10.   Time 4   Period Weeks   Status On-going  AROM 97 degrees 04/08/16     PT LONG TERM GOAL #4   Title Decrease edema to within 2 cms of non-affected side to assist with pain reduction and range of motion gains.   Time  4   Period Weeks   Status On-going     PT LONG TERM GOAL #5   Title Perform a reciprocating stair gait with one railing with pain not > 2-3/10.   Time 4   Period Weeks   Status On-going     PT LONG TERM GOAL #6   Title Increase left knee strength to a solid 4+/5 to provide good stability for accomplishment of functional activities   Time 4   Period Weeks   Status On-going               Plan - 04/13/16 1449    Clinical Impression Statement Patient tolerated treatment well today. Patient has increased sorness from a lot of walking and exercises over weekend. Patient has improved ROM for left knee flexion and ext today. Patient will have to have a JAS brace for ext per MD. Patient progressing toward goals yet ongoing due to pain, ROM and strength deficts   Rehab Potential Good   PT Frequency 3x / week   PT Duration 4 weeks   PT Treatment/Interventions ADLs/Self Care Home Management;Cryotherapy;Electrical Stimulation;Gait training;Stair training;Functional mobility training;Therapeutic activities;Therapeutic exercise;Neuromuscular re-education;Patient/family education;Passive range of motion;Manual techniques;Vasopneumatic Device   PT Next Visit Plan Continue with L TKR protcol with edema control and modalities PRN (MD. Maureen Ralphs 05/14/16)       Patient will benefit from skilled therapeutic intervention in order to improve the following deficits and impairments:  Abnormal gait, Pain, Increased edema, Decreased activity tolerance, Decreased range of motion, Decreased strength, Decreased mobility  Visit Diagnosis: Acute pain of left knee  Stiffness of left knee, not elsewhere classified  Localized edema  Muscle weakness (generalized)     Problem List Patient Active Problem List   Diagnosis Date Noted  . OA (osteoarthritis) of knee 03/04/2016  . Acute medial meniscal tear 03/06/2014  . Cold hands and feet 08/23/2012    DUNFORD, CHRISTINA P, PTA 04/13/2016, 3:10  PM  [facility] [place], [place], [address] Phone: [phone]   Fax:  [phone]  Name: DACK RIVETT MRN: [id number] Date of Birth: February 11, 1956

## 2016-04-15 ENCOUNTER — Encounter: Payer: Self-pay | Admitting: Physical Therapy

## 2016-04-15 ENCOUNTER — Ambulatory Visit: Payer: BLUE CROSS/BLUE SHIELD | Admitting: Physical Therapy

## 2016-04-15 DIAGNOSIS — M6281 Muscle weakness (generalized): Secondary | ICD-10-CM

## 2016-04-15 DIAGNOSIS — M25562 Pain in left knee: Secondary | ICD-10-CM | POA: Diagnosis not present

## 2016-04-15 DIAGNOSIS — R600 Localized edema: Secondary | ICD-10-CM

## 2016-04-15 DIAGNOSIS — M25662 Stiffness of left knee, not elsewhere classified: Secondary | ICD-10-CM

## 2016-04-15 NOTE — Patient Instructions (Addendum)
Stretching: Hamstring (Supine)    Laying on your back with a leash or belt around your foot, raise your left leg straight up until a stretch felt in the back of your left thigh. Hold __30__ seconds. Repeat _3___ times per set. Do ____ sets per session. Do __2-3__ sessions per day.  http://orth.exer.us/656   Copyright  VHI. All rights reserved.  Stretching: Iliotibial Band    Laying on your back with a leash/belt around your foot, raise your left leg straight up as if completing a hamstring stretch. Then cross over towards right side of your body. A stretch should be felt in the outside of the left thigh. Hold for 30 seconds. Repeat __3__ times per set. Do ____ sets per session. Do __2-3__ sessions per day.  http://orth.exer.us/714   Copyright  VHI. All rights reserved.

## 2016-04-15 NOTE — Therapy (Signed)
[facility] [place], [place], [address] Phone: [phone]   Fax:  [phone]  Physical Therapy Treatment  Patient Details  Name: Thomas Pham MRN: [id number] Date of Birth: 07-19-1955 Referring Provider: Gaynelle Arabian MD.  Encounter Date: 04/15/2016      PT End of Session - 04/15/16 1346    Visit Number 15   Number of Visits 24   Date for PT Re-Evaluation 05/08/16   PT Start Time 1344   PT Stop Time 1433   PT Time Calculation (min) 49 min   Activity Tolerance Patient tolerated treatment well   Behavior During Therapy [facility] for tasks assessed/performed      Past Medical History:  Diagnosis Date  . Allergy   . Arthritis    osteo  . Chronic kidney disease   . Difficulty sleeping   . GERD (gastroesophageal reflux disease)    takes nexium for acid reflux as needed  . History of gout   . History of hiatal hernia    umbilical hernia  . History of kidney stones   . Hyperlipidemia   . Hypertension   . Hypothyroidism   . Low back pain   . Medial meniscus tear    LEFT  . Organic impotence   . Penile cyst   . Sleep apnea    not using mask d/t preference  . Thyroid disease     Past Surgical History:  Procedure Laterality Date  . KNEE ARTHROSCOPY Left 03/07/2014   Procedure: LEFT ARTHROSCOPY KNEE WITH MEDIAL MENISCECTOMY  DEBRIDEMENT AND CHONDROPLASTY ;  Surgeon: Gearlean Alf, MD;  Location: [facility];  Service: Orthopedics;  Laterality: Left;  . THYROIDECTOMY Right 2013  . TONSILLECTOMY     childhood  . TOTAL KNEE ARTHROPLASTY Left 03/04/2016   Procedure: LEFT TOTAL KNEE ARTHROPLASTY;  Surgeon: Gaynelle Arabian, MD;  Location: [facility];  Service: Orthopedics;  Laterality: Left;    There were no vitals filed for this visit.      Subjective Assessment - 04/15/16 1344    Subjective Reports an unusual and sharp pain in medioinferior L knee that is only present with weightbearing. Reports sleeping a lot more   Pertinent History H/o LBP.   Patient Stated Goals Get knee back to normal.   Currently in Pain? Yes   Pain Score 8    Pain Location Knee   Pain Orientation Left   Pain Descriptors / Indicators Sharp   Pain Type Surgical pain   Pain Onset More than a month ago   Pain Frequency Intermittent   Aggravating Factors  Weightbearing            [facility] PT Assessment - 04/15/16 0001      Assessment   Medical Diagnosis left total knee replacement.   Onset Date/Surgical Date 03/04/16   Next MD Visit 05/14/2016     Restrictions   Weight Bearing Restrictions No     Circumferential Edema   Circumferential - Right 41.8 cm   Circumferential - Left  44.2 cm                     [facility] Adult PT Treatment/Exercise - 04/15/16 0001      Knee/Hip Exercises: Stretches   Passive Hamstring Stretch Left;3 reps;30 seconds   Other Knee/Hip Stretches Passive L ITB stretch 3x30 sec     Knee/Hip Exercises: Aerobic   Nustep L6, seat 8-7 x12 min     Knee/Hip Exercises: Standing   Rocker  Board 3 minutes     Modalities   Modalities Vasopneumatic     Vasopneumatic   Number Minutes Vasopneumatic  15 minutes   Vasopnuematic Location  Knee   Vasopneumatic Pressure Medium   Vasopneumatic Temperature  34     Manual Therapy   Manual Therapy Passive ROM;Soft tissue mobilization   Soft tissue mobilization STW with pressure to L medial HS to reduce tone    Passive ROM Gentle PROM of L knee into extension with holds at end range                PT Education - 04/15/16 1426    Education provided Yes   Education Details HEP- HS and ITB stretch   Person(s) Educated Patient   Methods Explanation;Verbal cues;Handout   Comprehension Verbalized understanding;Verbal cues required             PT Long Term Goals - 04/15/16 1428      PT LONG TERM GOAL #1   Title Independent with a HEP.   Time 4   Period Weeks   Status Achieved     PT LONG TERM GOAL #2   Title Full active left  knee extension.   Time 4   Period Weeks   Status On-going  AROM -19 degrees 04/08/16     PT LONG TERM GOAL #3   Title Active left knee flexion to 115 degrees+ so the patient can perform functional tasks and do so with pain not > 2-3/10.   Time 4   Period Weeks   Status On-going  AROM 97 degrees 04/08/16     PT LONG TERM GOAL #4   Title Decrease edema to within 2 cms of non-affected side to assist with pain reduction and range of motion gains.   Time 4   Period Weeks   Status On-going  2.4 cm difference today with L > R 04/15/2016     PT LONG TERM GOAL #5   Title Perform a reciprocating stair gait with one railing with pain not > 2-3/10.   Time 4   Period Weeks   Status On-going     PT LONG TERM GOAL #6   Title Increase left knee strength to a solid 4+/5 to provide good stability for accomplishment of functional activities   Time 4   Period Weeks   Status On-going               Plan - 04/15/16 1428    Clinical Impression Statement Patient tolerated today's treatment fairly well today as he arrived with reports of recent sharp pain that is present with LLE weightbearing. Patient able to complete exercises without any reports of pain and able to advance seat on Nustep without complaint. Passive L HS and ITB stretches completed again today secondary to palpable tone in both muscle groups especially along L medial HS. Moderate release with pressure and STW over L medial HS today. Patient experienced increased posterior L knee pain with gentle PROM of L knee into extension. Normal modalities response noted following removal of the modalities. Patient provided an HEP for HS and ITB stretches with instruction of technique and parameters. Thomas Pham, MPT educated regarding recent medial L knee pain and iontophoresis order.   Rehab Potential Good   PT Frequency 3x / week   PT Duration 4 weeks   PT Treatment/Interventions ADLs/Self Care Home Management;Cryotherapy;Electrical  Stimulation;Gait training;Stair training;Functional mobility training;Therapeutic activities;Therapeutic exercise;Neuromuscular re-education;Patient/family education;Passive range of motion;Manual techniques;Vasopneumatic Device   PT Next  Visit Plan Continue with L TKR protcol with edema control and modalities PRN (MD. Maureen Ralphs 05/14/16)    PT Home Exercise Plan HEP- prone hangs, SAQ   Consulted and Agree with Plan of Care Patient      Patient will benefit from skilled therapeutic intervention in order to improve the following deficits and impairments:  Abnormal gait, Pain, Increased edema, Decreased activity tolerance, Decreased range of motion, Decreased strength, Decreased mobility  Visit Diagnosis: Acute pain of left knee  Stiffness of left knee, not elsewhere classified  Localized edema  Muscle weakness (generalized)     Problem List Patient Active Problem List   Diagnosis Date Noted  . OA (osteoarthritis) of knee 03/04/2016  . Acute medial meniscal tear 03/06/2014  . Cold hands and feet 08/23/2012    Thomas Pham, PTA 04/15/2016, 2:47 PM  [facility] [address] [place], [place], [address] Phone: [phone]   Fax:  [phone]  Name: Thomas Pham MRN: [id number] Date of Birth: 1955-04-16

## 2016-04-16 ENCOUNTER — Encounter: Payer: Self-pay | Admitting: Physical Therapy

## 2016-04-16 ENCOUNTER — Ambulatory Visit: Payer: BLUE CROSS/BLUE SHIELD | Attending: Orthopedic Surgery | Admitting: Physical Therapy

## 2016-04-16 DIAGNOSIS — M25562 Pain in left knee: Secondary | ICD-10-CM | POA: Insufficient documentation

## 2016-04-16 DIAGNOSIS — M6281 Muscle weakness (generalized): Secondary | ICD-10-CM | POA: Diagnosis present

## 2016-04-16 DIAGNOSIS — R600 Localized edema: Secondary | ICD-10-CM | POA: Insufficient documentation

## 2016-04-16 DIAGNOSIS — M25662 Stiffness of left knee, not elsewhere classified: Secondary | ICD-10-CM | POA: Diagnosis present

## 2016-04-16 NOTE — Therapy (Signed)
[facility] [place], [place], [address] Phone: [phone]   Fax:  [phone]  Physical Therapy Treatment  Patient Details  Name: Thomas Pham MRN: [id number] Date of Birth: 1955-03-16 Referring Provider: Gaynelle Arabian MD.  Encounter Date: 04/16/2016      PT End of Session - 04/16/16 1414    Visit Number 16   Number of Visits 24   Date for PT Re-Evaluation 05/08/16   PT Start Time 1400   PT Stop Time 1458   PT Time Calculation (min) 58 min   Activity Tolerance Patient tolerated treatment well   Behavior During Therapy [facility] for tasks assessed/performed      Past Medical History:  Diagnosis Date  . Allergy   . Arthritis    osteo  . Chronic kidney disease   . Difficulty sleeping   . GERD (gastroesophageal reflux disease)    takes nexium for acid reflux as needed  . History of gout   . History of hiatal hernia    umbilical hernia  . History of kidney stones   . Hyperlipidemia   . Hypertension   . Hypothyroidism   . Low back pain   . Medial meniscus tear    LEFT  . Organic impotence   . Penile cyst   . Sleep apnea    not using mask d/t preference  . Thyroid disease     Past Surgical History:  Procedure Laterality Date  . KNEE ARTHROSCOPY Left 03/07/2014   Procedure: LEFT ARTHROSCOPY KNEE WITH MEDIAL MENISCECTOMY  DEBRIDEMENT AND CHONDROPLASTY ;  Surgeon: Gearlean Alf, MD;  Location: [facility];  Service: Orthopedics;  Laterality: Left;  . THYROIDECTOMY Right 2013  . TONSILLECTOMY     childhood  . TOTAL KNEE ARTHROPLASTY Left 03/04/2016   Procedure: LEFT TOTAL KNEE ARTHROPLASTY;  Surgeon: Gaynelle Arabian, MD;  Location: [facility];  Service: Orthopedics;  Laterality: Left;    There were no vitals filed for this visit.      Subjective Assessment - 04/16/16 1401    Subjective Patient got measured for JAS brace today, reported being up all day without crutches today, some soreness in knee   Pertinent  History H/o LBP.   Patient Stated Goals Get knee back to normal.   Currently in Pain? Yes   Pain Score 5    Pain Location Knee   Pain Orientation Left   Pain Descriptors / Indicators Sharp   Pain Type Surgical pain   Pain Onset More than a month ago   Pain Frequency Intermittent   Aggravating Factors  prolong walking or activity   Pain Relieving Factors at rest            [facility] PT Assessment - 04/16/16 0001      AROM   AROM Assessment Site Knee   Right/Left Knee Left   Left Knee Extension -17   Left Knee Flexion 105     PROM   PROM Assessment Site Knee   Right/Left Knee Left   Left Knee Extension -10   Left Knee Flexion 110                     [facility] Adult PT Treatment/Exercise - 04/16/16 0001      Knee/Hip Exercises: Aerobic   Stationary Bike x30min L3-4   Nustep L7 x27min UE/LE     Vasopneumatic   Number Minutes Vasopneumatic  15 minutes   Vasopnuematic Location  Knee   Vasopneumatic Pressure Medium  Manual Therapy   Manual Therapy Passive ROM   Passive ROM Gentle PROM of L knee into extension with holds at end range                PT Education - 04/15/16 1426    Education provided Yes   Education Details HEP- HS and ITB stretch   Person(s) Educated Patient   Methods Explanation;Verbal cues;Handout   Comprehension Verbalized understanding;Verbal cues required             PT Long Term Goals - 04/15/16 1428      PT LONG TERM GOAL #1   Title Independent with a HEP.   Time 4   Period Weeks   Status Achieved     PT LONG TERM GOAL #2   Title Full active left knee extension.   Time 4   Period Weeks   Status On-going  AROM -19 degrees 04/08/16     PT LONG TERM GOAL #3   Title Active left knee flexion to 115 degrees+ so the patient can perform functional tasks and do so with pain not > 2-3/10.   Time 4   Period Weeks   Status On-going  AROM 97 degrees 04/08/16     PT LONG TERM GOAL #4   Title Decrease edema to within  2 cms of non-affected side to assist with pain reduction and range of motion gains.   Time 4   Period Weeks   Status On-going  2.4 cm difference today with L > R 04/15/2016     PT LONG TERM GOAL #5   Title Perform a reciprocating stair gait with one railing with pain not > 2-3/10.   Time 4   Period Weeks   Status On-going     PT LONG TERM GOAL #6   Title Increase left knee strength to a solid 4+/5 to provide good stability for accomplishment of functional activities   Time 4   Period Weeks   Status On-going               Plan - 04/16/16 1436    Clinical Impression Statement Patient tolerated treatment well today. Patient progressing with ROM and able to perform full revolution on bike today. Patient has improved ROM in knee esp for left knee flexion today. Patient continues to perform self stretches daily. Current goals ongoing due to full ROM deficts, pain and strength limitations.    Rehab Potential Good   PT Frequency 3x / week   PT Duration 4 weeks   PT Treatment/Interventions ADLs/Self Care Home Management   PT Next Visit Plan Continue with L TKR protcol with edema control and modalities PRN (MD. Maureen Ralphs 05/14/16)    Consulted and Agree with Plan of Care Patient      Patient will benefit from skilled therapeutic intervention in order to improve the following deficits and impairments:  Abnormal gait, Pain, Increased edema, Decreased activity tolerance, Decreased range of motion, Decreased strength, Decreased mobility  Visit Diagnosis: Acute pain of left knee  Stiffness of left knee, not elsewhere classified  Localized edema  Muscle weakness (generalized)     Problem List Patient Active Problem List   Diagnosis Date Noted  . OA (osteoarthritis) of knee 03/04/2016  . Acute medial meniscal tear 03/06/2014  . Cold hands and feet 08/23/2012    Doryce Mcgregory P, PTA 04/16/2016, 3:11 PM  [facility] [address] [place], [place], [address] Phone: [phone]   Fax:  [phone]  Name: Thomas Pham MRN: [id number] Date of Birth: 1955/07/11

## 2016-04-20 ENCOUNTER — Encounter: Payer: Self-pay | Admitting: Physical Therapy

## 2016-04-20 ENCOUNTER — Ambulatory Visit: Payer: BLUE CROSS/BLUE SHIELD | Admitting: Physical Therapy

## 2016-04-20 DIAGNOSIS — M25562 Pain in left knee: Secondary | ICD-10-CM | POA: Diagnosis not present

## 2016-04-20 DIAGNOSIS — M25662 Stiffness of left knee, not elsewhere classified: Secondary | ICD-10-CM

## 2016-04-20 DIAGNOSIS — R600 Localized edema: Secondary | ICD-10-CM

## 2016-04-20 DIAGNOSIS — M6281 Muscle weakness (generalized): Secondary | ICD-10-CM

## 2016-04-20 NOTE — Therapy (Signed)
[facility] [place], [place], [address] Phone: [phone]   Fax:  [phone]  Physical Therapy Treatment  Patient Details  Name: Thomas Pham MRN: [id number] Date of Birth: 1955-08-22 Referring Provider: Gaynelle Arabian MD.  Encounter Date: 04/20/2016      PT End of Session - 04/20/16 1439    Visit Number 17   Number of Visits 24   Date for PT Re-Evaluation 05/08/16   PT Start Time 1401   PT Stop Time 1457   PT Time Calculation (min) 56 min   Activity Tolerance Patient tolerated treatment well   Behavior During Therapy [facility] for tasks assessed/performed      Past Medical History:  Diagnosis Date  . Allergy   . Arthritis    osteo  . Chronic kidney disease   . Difficulty sleeping   . GERD (gastroesophageal reflux disease)    takes nexium for acid reflux as needed  . History of gout   . History of hiatal hernia    umbilical hernia  . History of kidney stones   . Hyperlipidemia   . Hypertension   . Hypothyroidism   . Low back pain   . Medial meniscus tear    LEFT  . Organic impotence   . Penile cyst   . Sleep apnea    not using mask d/t preference  . Thyroid disease     Past Surgical History:  Procedure Laterality Date  . KNEE ARTHROSCOPY Left 03/07/2014   Procedure: LEFT ARTHROSCOPY KNEE WITH MEDIAL MENISCECTOMY  DEBRIDEMENT AND CHONDROPLASTY ;  Surgeon: Gearlean Alf, MD;  Location: [facility];  Service: Orthopedics;  Laterality: Left;  . THYROIDECTOMY Right 2013  . TONSILLECTOMY     childhood  . TOTAL KNEE ARTHROPLASTY Left 03/04/2016   Procedure: LEFT TOTAL KNEE ARTHROPLASTY;  Surgeon: Gaynelle Arabian, MD;  Location: [facility];  Service: Orthopedics;  Laterality: Left;    There were no vitals filed for this visit.      Subjective Assessment - 04/20/16 1411    Subjective Patient felt better after last treatment and some ache in knee today   Pertinent History H/o LBP.   Patient Stated Goals Get knee  back to normal.   Currently in Pain? Yes   Pain Score 4    Pain Location Knee   Pain Orientation Left   Pain Descriptors / Indicators Sharp   Pain Type Surgical pain   Pain Onset More than a month ago   Pain Frequency Intermittent   Aggravating Factors  prolong activity   Pain Relieving Factors at rest            [facility] PT Assessment - 04/20/16 0001      AROM   AROM Assessment Site Knee   Right/Left Knee Left   Left Knee Extension -15   Left Knee Flexion 106     PROM   PROM Assessment Site Knee   Right/Left Knee Left   Left Knee Extension -9   Left Knee Flexion 113                     [facility] Adult PT Treatment/Exercise - 04/20/16 0001      Knee/Hip Exercises: Aerobic   Stationary Bike 62min L7     Knee/Hip Exercises: Standing   Rocker Board 3 minutes     Vasopneumatic   Number Minutes Vasopneumatic  15 minutes   Vasopnuematic Location  Knee   Vasopneumatic Pressure Medium  Manual Therapy   Manual Therapy Passive ROM   Passive ROM Gentle PROM of L knee into extension with holds at end range                     PT Long Term Goals - 04/15/16 1428      PT LONG TERM GOAL #1   Title Independent with a HEP.   Time 4   Period Weeks   Status Achieved     PT LONG TERM GOAL #2   Title Full active left knee extension.   Time 4   Period Weeks   Status On-going  AROM -19 degrees 04/08/16     PT LONG TERM GOAL #3   Title Active left knee flexion to 115 degrees+ so the patient can perform functional tasks and do so with pain not > 2-3/10.   Time 4   Period Weeks   Status On-going  AROM 97 degrees 04/08/16     PT LONG TERM GOAL #4   Title Decrease edema to within 2 cms of non-affected side to assist with pain reduction and range of motion gains.   Time 4   Period Weeks   Status On-going  2.4 cm difference today with L > R 04/15/2016     PT LONG TERM GOAL #5   Title Perform a reciprocating stair gait with one railing with pain not  > 2-3/10.   Time 4   Period Weeks   Status On-going     PT LONG TERM GOAL #6   Title Increase left knee strength to a solid 4+/5 to provide good stability for accomplishment of functional activities   Time 4   Period Weeks   Status On-going               Plan - 04/20/16 1440    Clinical Impression Statement Patient tolerated treatment well today. Patient continues to improve ROM in left knee. Patient requested ROM focus today and may try to increase exercise next treatment. Patient progressing toward goals yet ongoing due to ROM, strength and pain deficts.    Rehab Potential Good   PT Frequency 3x / week   PT Duration 4 weeks   PT Treatment/Interventions ADLs/Self Care Home Management;Cryotherapy;Electrical Stimulation;Gait training;Stair training;Functional mobility training;Therapeutic activities;Therapeutic exercise;Vasopneumatic Device;Manual techniques;Passive range of motion   PT Next Visit Plan Continue with L TKR protcol with edema control and modalities PRN (MD. Maureen Ralphs 05/14/16)    Consulted and Agree with Plan of Care Patient      Patient will benefit from skilled therapeutic intervention in order to improve the following deficits and impairments:  Abnormal gait, Pain, Increased edema, Decreased activity tolerance, Decreased range of motion, Decreased strength, Decreased mobility  Visit Diagnosis: Acute pain of left knee  Stiffness of left knee, not elsewhere classified  Localized edema  Muscle weakness (generalized)     Problem List Patient Active Problem List   Diagnosis Date Noted  . OA (osteoarthritis) of knee 03/04/2016  . Acute medial meniscal tear 03/06/2014  . Cold hands and feet 08/23/2012    Crisanto Nied P, PTA 04/20/2016, 2:57 PM  [facility] [place], [place], [address] Phone: [phone]   Fax:  [phone]  Name: KAYHAN SOUFFRONT MRN: [id number] Date of Birth:  January 19, 1956

## 2016-04-21 ENCOUNTER — Ambulatory Visit: Payer: BLUE CROSS/BLUE SHIELD | Admitting: Physical Therapy

## 2016-04-21 ENCOUNTER — Encounter: Payer: Self-pay | Admitting: Physical Therapy

## 2016-04-21 DIAGNOSIS — M25562 Pain in left knee: Secondary | ICD-10-CM

## 2016-04-21 DIAGNOSIS — M25662 Stiffness of left knee, not elsewhere classified: Secondary | ICD-10-CM

## 2016-04-21 DIAGNOSIS — M6281 Muscle weakness (generalized): Secondary | ICD-10-CM

## 2016-04-21 DIAGNOSIS — R600 Localized edema: Secondary | ICD-10-CM

## 2016-04-21 NOTE — Therapy (Signed)
[facility] [place], [place], [address] Phone: [phone]   Fax:  [phone]  Physical Therapy Treatment  Patient Details  Name: Thomas Pham MRN: [id number] Date of Birth: 10-27-1955 Referring Provider: Gaynelle Arabian MD.  Encounter Date: 04/21/2016      PT End of Session - 04/21/16 1032    Visit Number 18   Number of Visits 24   Date for PT Re-Evaluation 05/08/16   PT Start Time 1029   PT Stop Time 1127   PT Time Calculation (min) 58 min   Activity Tolerance Patient tolerated treatment well   Behavior During Therapy [facility] for tasks assessed/performed      Past Medical History:  Diagnosis Date  . Allergy   . Arthritis    osteo  . Chronic kidney disease   . Difficulty sleeping   . GERD (gastroesophageal reflux disease)    takes nexium for acid reflux as needed  . History of gout   . History of hiatal hernia    umbilical hernia  . History of kidney stones   . Hyperlipidemia   . Hypertension   . Hypothyroidism   . Low back pain   . Medial meniscus tear    LEFT  . Organic impotence   . Penile cyst   . Sleep apnea    not using mask d/t preference  . Thyroid disease     Past Surgical History:  Procedure Laterality Date  . KNEE ARTHROSCOPY Left 03/07/2014   Procedure: LEFT ARTHROSCOPY KNEE WITH MEDIAL MENISCECTOMY  DEBRIDEMENT AND CHONDROPLASTY ;  Surgeon: Gearlean Alf, MD;  Location: [facility];  Service: Orthopedics;  Laterality: Left;  . THYROIDECTOMY Right 2013  . TONSILLECTOMY     childhood  . TOTAL KNEE ARTHROPLASTY Left 03/04/2016   Procedure: LEFT TOTAL KNEE ARTHROPLASTY;  Surgeon: Gaynelle Arabian, MD;  Location: [facility];  Service: Orthopedics;  Laterality: Left;    There were no vitals filed for this visit.      Subjective Assessment - 04/21/16 1031    Subjective Reports that he had a lot of pain over night.   Pertinent History H/o LBP.   Patient Stated Goals Get knee back to normal.    Currently in Pain? Yes   Pain Score 5    Pain Location Knee   Pain Orientation Left;Medial   Pain Descriptors / Indicators Aching   Pain Type Surgical pain   Pain Onset More than a month ago            [facility] PT Assessment - 04/21/16 0001      Assessment   Medical Diagnosis left total knee replacement.   Onset Date/Surgical Date 03/04/16   Next MD Visit 05/14/2016     Restrictions   Weight Bearing Restrictions No     ROM / Strength   AROM / PROM / Strength AROM     AROM   Overall AROM  Within functional limits for tasks performed;Deficits   AROM Assessment Site Knee   Right/Left Knee Left   Left Knee Extension -12   Left Knee Flexion 122                     [facility] Adult PT Treatment/Exercise - 04/21/16 0001      Knee/Hip Exercises: Aerobic   Stationary Bike L7 x15 min     Knee/Hip Exercises: Standing   Forward Step Up Left;3 sets;10 reps;Hand Hold: 2;Step Height: 8"   Rocker Board 3 minutes  Knee/Hip Exercises: Sidelying   Hip ABduction Strengthening;Left;3 sets;10 reps     Modalities   Modalities Vasopneumatic     Vasopneumatic   Number Minutes Vasopneumatic  15 minutes   Vasopnuematic Location  Knee   Vasopneumatic Pressure Medium   Vasopneumatic Temperature  34     Manual Therapy   Manual Therapy Passive ROM   Passive ROM PROM of L knee into flexion and extension with holds at end range to promote ROM                     PT Long Term Goals - 04/21/16 1113      PT LONG TERM GOAL #1   Title Independent with a HEP.   Time 4   Period Weeks   Status Achieved     PT LONG TERM GOAL #2   Title Full active left knee extension.   Time 4   Period Weeks   Status On-going  AROM -12 degrees from neutral  04/21/16     PT LONG TERM GOAL #3   Title Active left knee flexion to 115 degrees+ so the patient can perform functional tasks and do so with pain not > 2-3/10.   Time 4   Period Weeks   Status Achieved  AROM 122 deg  04/21/2016     PT LONG TERM GOAL #4   Title Decrease edema to within 2 cms of non-affected side to assist with pain reduction and range of motion gains.   Time 4   Period Weeks   Status On-going  2.4 cm difference today with L > R 04/15/2016     PT LONG TERM GOAL #5   Title Perform a reciprocating stair gait with one railing with pain not > 2-3/10.   Time 4   Period Weeks   Status On-going     PT LONG TERM GOAL #6   Title Increase left knee strength to a solid 4+/5 to provide good stability for accomplishment of functional activities   Time 4   Period Weeks   Status On-going               Plan - 04/21/16 1110    Clinical Impression Statement Patient tolerated today's treatment well after initial exercises as he had minimal L medial knee pain. Patient able to complete stationary bike well with full revolutions when he started. Patient used minimal to no UE support with forward step ups today. Tone continues to be palpated in medial L HS upon palpation with moderate release with MFR/TPR. AROM of L knee improved to 12-122 deg today. Normal vasopneumatic response noted following removal of the modality.   Rehab Potential Good   PT Frequency 3x / week   PT Duration 4 weeks   PT Treatment/Interventions ADLs/Self Care Home Management;Cryotherapy;Electrical Stimulation;Gait training;Stair training;Functional mobility training;Therapeutic activities;Therapeutic exercise;Vasopneumatic Device;Manual techniques;Passive range of motion   PT Next Visit Plan Continue with L TKR protcol with edema control and modalities PRN (MD. Maureen Ralphs 05/14/16)    PT Home Exercise Plan HEP- prone hangs, SAQ   Consulted and Agree with Plan of Care Patient      Patient will benefit from skilled therapeutic intervention in order to improve the following deficits and impairments:  Abnormal gait, Pain, Increased edema, Decreased activity tolerance, Decreased range of motion, Decreased strength, Decreased  mobility  Visit Diagnosis: Acute pain of left knee  Stiffness of left knee, not elsewhere classified  Localized edema  Muscle weakness (generalized)  Problem List Patient Active Problem List   Diagnosis Date Noted  . OA (osteoarthritis) of knee 03/04/2016  . Acute medial meniscal tear 03/06/2014  . Cold hands and feet 08/23/2012    Wynelle Fanny, PTA 04/21/2016, 11:52 AM  [facility] [address] [place], [place], [address] Phone: [phone]   Fax:  [phone]  Name: Thomas Pham MRN: [id number] Date of Birth: [birth year]

## 2016-04-22 ENCOUNTER — Encounter: Payer: Self-pay | Admitting: Physical Therapy

## 2016-04-22 ENCOUNTER — Ambulatory Visit: Payer: BLUE CROSS/BLUE SHIELD | Admitting: Physical Therapy

## 2016-04-22 DIAGNOSIS — M6281 Muscle weakness (generalized): Secondary | ICD-10-CM

## 2016-04-22 DIAGNOSIS — M25562 Pain in left knee: Secondary | ICD-10-CM

## 2016-04-22 DIAGNOSIS — M25662 Stiffness of left knee, not elsewhere classified: Secondary | ICD-10-CM

## 2016-04-22 DIAGNOSIS — R600 Localized edema: Secondary | ICD-10-CM

## 2016-04-22 NOTE — Therapy (Signed)
[facility] [place], [place], [address] Phone: [phone]   Fax:  [phone]  Physical Therapy Treatment  Patient Details  Name: Thomas Pham: [id number] Date of Birth: [birth year] Referring Provider: Gaynelle Arabian MD.  Encounter Date: 04/22/2016      PT End of Session - 04/22/16 1108    Visit Number 19   Number of Visits 24   Date for PT Re-Evaluation 05/08/16   PT Start Time 1029   PT Stop Time 1125   PT Time Calculation (min) 56 min   Activity Tolerance Patient tolerated treatment well   Behavior During Therapy [facility] for tasks assessed/performed      Past Medical History:  Diagnosis Date  . Allergy   . Arthritis    osteo  . Chronic kidney disease   . Difficulty sleeping   . GERD (gastroesophageal reflux disease)    takes nexium for acid reflux as needed  . History of gout   . History of hiatal hernia    umbilical hernia  . History of kidney stones   . Hyperlipidemia   . Hypertension   . Hypothyroidism   . Low back pain   . Medial meniscus tear    LEFT  . Organic impotence   . Penile cyst   . Sleep apnea    not using mask d/t preference  . Thyroid disease     Past Surgical History:  Procedure Laterality Date  . KNEE ARTHROSCOPY Left 03/07/2014   Procedure: LEFT ARTHROSCOPY KNEE WITH MEDIAL MENISCECTOMY  DEBRIDEMENT AND CHONDROPLASTY ;  Surgeon: Gearlean Alf, MD;  Location: [facility];  Service: Orthopedics;  Laterality: Left;  . THYROIDECTOMY Right 2013  . TONSILLECTOMY     childhood  . TOTAL KNEE ARTHROPLASTY Left 03/04/2016   Procedure: LEFT TOTAL KNEE ARTHROPLASTY;  Surgeon: Gaynelle Arabian, MD;  Location: [facility];  Service: Orthopedics;  Laterality: Left;    There were no vitals filed for this visit.      Subjective Assessment - 04/22/16 1033    Subjective Patient reported doing better today and less pain.   Pertinent History H/o LBP.   Patient Stated Goals Get knee back to normal.    Currently in Pain? Yes   Pain Score 4    Pain Location Knee   Pain Orientation Left   Pain Type Surgical pain   Pain Onset More than a month ago   Pain Frequency Intermittent   Aggravating Factors  prolong activity   Pain Relieving Factors at rest and ice            [facility] PT Assessment - 04/22/16 0001      AROM   AROM Assessment Site Knee   Right/Left Knee Left   Left Knee Extension -14   Left Knee Flexion 115     PROM   PROM Assessment Site Knee   Right/Left Knee Left   Left Knee Extension -9   Left Knee Flexion 121                     [facility] Adult PT Treatment/Exercise - 04/22/16 0001      Knee/Hip Exercises: Aerobic   Stationary Bike L7 x15 min     Knee/Hip Exercises: Standing   Forward Step Up Left;3 sets;10 reps;Hand Hold: 2;Step Height: 8"   Rocker Board 3 minutes     Vasopneumatic   Number Minutes Vasopneumatic  15 minutes   Vasopnuematic Location  Knee   Vasopneumatic  Pressure Medium     Manual Therapy   Manual Therapy Passive ROM   Passive ROM PROM of L knee into flexion and extension with holds at end range to promote ROM                     PT Long Term Goals - 04/21/16 1113      PT LONG TERM GOAL #1   Title Independent with a HEP.   Time 4   Period Weeks   Status Achieved     PT LONG TERM GOAL #2   Title Full active left knee extension.   Time 4   Period Weeks   Status On-going  AROM -12 degrees from neutral  04/21/16     PT LONG TERM GOAL #3   Title Active left knee flexion to 115 degrees+ so the patient can perform functional tasks and do so with pain not > 2-3/10.   Time 4   Period Weeks   Status Achieved  AROM 122 deg 04/21/2016     PT LONG TERM GOAL #4   Title Decrease edema to within 2 cms of non-affected side to assist with pain reduction and range of motion gains.   Time 4   Period Weeks   Status On-going  2.4 cm difference today with L > R 04/15/2016     PT LONG TERM GOAL #5   Title Perform a  reciprocating stair gait with one railing with pain not > 2-3/10.   Time 4   Period Weeks   Status On-going     PT LONG TERM GOAL #6   Title Increase left knee strength to a solid 4+/5 to provide good stability for accomplishment of functional activities   Time 4   Period Weeks   Status On-going               Plan - 04/22/16 1109    Clinical Impression Statement Patient tolerated treatment well today. Patient continues to improve with ROM and exercises per patient tolerance. Patient is able to bend knee with greater ease yet has ongoing pain medial left knee. Patient progressing toward goals. Patient remaining goals ongoing due to strength, edema and full AROM for left knee ext.    Rehab Potential Good   PT Frequency 3x / week   PT Duration 4 weeks   PT Treatment/Interventions ADLs/Self Care Home Management;Cryotherapy;Electrical Stimulation;Gait training;Stair training;Functional mobility training;Therapeutic activities;Therapeutic exercise;Vasopneumatic Device;Manual techniques;Passive range of motion   PT Next Visit Plan Continue with L TKR protcol with edema control and modalities PRN (MD. Maureen Ralphs 05/14/16)    Consulted and Agree with Plan of Care Patient      Patient will benefit from skilled therapeutic intervention in order to improve the following deficits and impairments:  Abnormal gait, Pain, Increased edema, Decreased activity tolerance, Decreased range of motion, Decreased strength, Decreased mobility  Visit Diagnosis: Acute pain of left knee  Stiffness of left knee, not elsewhere classified  Localized edema  Muscle weakness (generalized)     Problem List Patient Active Problem List   Diagnosis Date Noted  . OA (osteoarthritis) of knee 03/04/2016  . Acute medial meniscal tear 03/06/2014  . Cold hands and feet 08/23/2012    Thomas Pham, PTA 04/22/2016, 11:26 AM  [facility] [place], [place], [address] Phone: [phone]   Fax:  [phone]  Name: Thomas Pham: [id number] Date of Birth: Feb 28, 1955

## 2016-04-27 ENCOUNTER — Ambulatory Visit: Payer: BLUE CROSS/BLUE SHIELD | Admitting: Physical Therapy

## 2016-04-27 ENCOUNTER — Encounter: Payer: Self-pay | Admitting: Physical Therapy

## 2016-04-27 DIAGNOSIS — M6281 Muscle weakness (generalized): Secondary | ICD-10-CM

## 2016-04-27 DIAGNOSIS — M25562 Pain in left knee: Secondary | ICD-10-CM | POA: Diagnosis not present

## 2016-04-27 DIAGNOSIS — M25662 Stiffness of left knee, not elsewhere classified: Secondary | ICD-10-CM

## 2016-04-27 DIAGNOSIS — R600 Localized edema: Secondary | ICD-10-CM

## 2016-04-27 NOTE — Therapy (Signed)
[facility] [place], [place], [address] Phone: [phone]   Fax:  [phone]  Physical Therapy Treatment  Patient Details  Name: Thomas Pham MRN: [id number] Date of Birth: June 01, 1955 Referring Provider: Gaynelle Arabian MD.  Encounter Date: 04/27/2016      PT End of Session - 04/27/16 1124    Visit Number 20   Number of Visits 24   Date for PT Re-Evaluation 05/08/16   PT Start Time 1121   PT Stop Time 1150   PT Time Calculation (min) 29 min   Activity Tolerance Patient tolerated treatment well   Behavior During Therapy [facility] for tasks assessed/performed      Past Medical History:  Diagnosis Date  . Allergy   . Arthritis    osteo  . Chronic kidney disease   . Difficulty sleeping   . GERD (gastroesophageal reflux disease)    takes nexium for acid reflux as needed  . History of gout   . History of hiatal hernia    umbilical hernia  . History of kidney stones   . Hyperlipidemia   . Hypertension   . Hypothyroidism   . Low back pain   . Medial meniscus tear    LEFT  . Organic impotence   . Penile cyst   . Sleep apnea    not using mask d/t preference  . Thyroid disease     Past Surgical History:  Procedure Laterality Date  . KNEE ARTHROSCOPY Left 03/07/2014   Procedure: LEFT ARTHROSCOPY KNEE WITH MEDIAL MENISCECTOMY  DEBRIDEMENT AND CHONDROPLASTY ;  Surgeon: Gearlean Alf, MD;  Location: [facility];  Service: Orthopedics;  Laterality: Left;  . THYROIDECTOMY Right 2013  . TONSILLECTOMY     childhood  . TOTAL KNEE ARTHROPLASTY Left 03/04/2016   Procedure: LEFT TOTAL KNEE ARTHROPLASTY;  Surgeon: Gaynelle Arabian, MD;  Location: [facility];  Service: Orthopedics;  Laterality: Left;    There were no vitals filed for this visit.      Subjective Assessment - 04/27/16 1122    Subjective Reports that he has been using a JAS brace for 2 days. Reports that pain is worse at night.   Pertinent History H/o LBP.   Patient  Stated Goals Get knee back to normal.   Currently in Pain? Yes   Pain Score --  "not as bad"   Pain Location Knee   Pain Orientation Left   Pain Descriptors / Indicators Aching   Pain Type Surgical pain   Pain Onset More than a month ago            [facility] PT Assessment - 04/27/16 0001      Assessment   Medical Diagnosis left total knee replacement.   Onset Date/Surgical Date 03/04/16   Next MD Visit 05/14/2016     Restrictions   Weight Bearing Restrictions No     ROM / Strength   AROM / PROM / Strength AROM     AROM   Overall AROM  Within functional limits for tasks performed;Deficits   AROM Assessment Site Knee   Right/Left Knee Left   Left Knee Extension -12   Left Knee Flexion 120                     [facility] Adult PT Treatment/Exercise - 04/27/16 0001      Knee/Hip Exercises: Aerobic   Stationary Bike L7 x15 min     Knee/Hip Exercises: Standing   Forward Step Up Left;3  sets;10 reps;Hand Hold: 2;Step Height: 8"   Rocker Board 3 minutes     Knee/Hip Exercises: Supine   Straight Leg Raises Strengthening;Left;2 sets;10 reps     Manual Therapy   Manual Therapy Passive ROM   Passive ROM PROM of L knee into flexion and extension with holds at end range to promote ROM                     PT Long Term Goals - 04/21/16 1113      PT LONG TERM GOAL #1   Title Independent with a HEP.   Time 4   Period Weeks   Status Achieved     PT LONG TERM GOAL #2   Title Full active left knee extension.   Time 4   Period Weeks   Status On-going  AROM -12 degrees from neutral  04/21/16     PT LONG TERM GOAL #3   Title Active left knee flexion to 115 degrees+ so the patient can perform functional tasks and do so with pain not > 2-3/10.   Time 4   Period Weeks   Status Achieved  AROM 122 deg 04/21/2016     PT LONG TERM GOAL #4   Title Decrease edema to within 2 cms of non-affected side to assist with pain reduction and range of motion gains.   Time  4   Period Weeks   Status On-going  2.4 cm difference today with L > R 04/15/2016     PT LONG TERM GOAL #5   Title Perform a reciprocating stair gait with one railing with pain not > 2-3/10.   Time 4   Period Weeks   Status On-going     PT LONG TERM GOAL #6   Title Increase left knee strength to a solid 4+/5 to provide good stability for accomplishment of functional activities   Time 4   Period Weeks   Status On-going               Plan - 04/27/16 1155    Clinical Impression Statement Patient tolerated today's treatment well today and seems encouraged by JAS brace. Patient able to complete exercises as directed without difficulty. PROM of L knee much smoother today. AROM of L knee measured as 12-120 deg. Patient denied modalities secondary to bad weather.   Rehab Potential Good   PT Frequency 3x / week   PT Duration 4 weeks   PT Treatment/Interventions ADLs/Self Care Home Management;Cryotherapy;Electrical Stimulation;Gait training;Stair training;Functional mobility training;Therapeutic activities;Therapeutic exercise;Vasopneumatic Device;Manual techniques;Passive range of motion   PT Next Visit Plan Continue with L TKR protcol with edema control and modalities PRN (MD. Maureen Ralphs 05/14/16)    PT Home Exercise Plan HEP- prone hangs, SAQ   Consulted and Agree with Plan of Care Patient      Patient will benefit from skilled therapeutic intervention in order to improve the following deficits and impairments:  Abnormal gait, Pain, Increased edema, Decreased activity tolerance, Decreased range of motion, Decreased strength, Decreased mobility  Visit Diagnosis: Acute pain of left knee  Stiffness of left knee, not elsewhere classified  Localized edema  Muscle weakness (generalized)     Problem List Patient Active Problem List   Diagnosis Date Noted  . OA (osteoarthritis) of knee 03/04/2016  . Acute medial meniscal tear 03/06/2014  . Cold hands and feet 08/23/2012     Wynelle Fanny, PTA 04/27/2016, 11:59 AM  [facility] [place],  [place], [address] Phone: [phone]   Fax:  [phone]  Name: Thomas Pham MRN: [id number] Date of Birth: 14-May-1955

## 2016-04-28 ENCOUNTER — Encounter: Payer: Self-pay | Admitting: Physical Therapy

## 2016-04-28 ENCOUNTER — Ambulatory Visit: Payer: BLUE CROSS/BLUE SHIELD | Admitting: Physical Therapy

## 2016-04-28 DIAGNOSIS — M25562 Pain in left knee: Secondary | ICD-10-CM

## 2016-04-28 DIAGNOSIS — M6281 Muscle weakness (generalized): Secondary | ICD-10-CM

## 2016-04-28 DIAGNOSIS — M25662 Stiffness of left knee, not elsewhere classified: Secondary | ICD-10-CM

## 2016-04-28 DIAGNOSIS — R600 Localized edema: Secondary | ICD-10-CM

## 2016-04-28 NOTE — Therapy (Signed)
[facility] [place], [place], [address] Phone: [phone]   Fax:  [phone]  Physical Therapy Treatment  Patient Details  Name: Thomas Pham MRN: [id number] Date of Birth: 08/01/1955 Referring Provider: Gaynelle Arabian MD.  Encounter Date: 04/28/2016      PT End of Session - 04/28/16 1355    Visit Number 21   Number of Visits 24   Date for PT Re-Evaluation 05/08/16   PT Start Time 1346   PT Stop Time 1435   PT Time Calculation (min) 49 min   Activity Tolerance Patient tolerated treatment well   Behavior During Therapy [facility] for tasks assessed/performed      Past Medical History:  Diagnosis Date  . Allergy   . Arthritis    osteo  . Chronic kidney disease   . Difficulty sleeping   . GERD (gastroesophageal reflux disease)    takes nexium for acid reflux as needed  . History of gout   . History of hiatal hernia    umbilical hernia  . History of kidney stones   . Hyperlipidemia   . Hypertension   . Hypothyroidism   . Low back pain   . Medial meniscus tear    LEFT  . Organic impotence   . Penile cyst   . Sleep apnea    not using mask d/t preference  . Thyroid disease     Past Surgical History:  Procedure Laterality Date  . KNEE ARTHROSCOPY Left 03/07/2014   Procedure: LEFT ARTHROSCOPY KNEE WITH MEDIAL MENISCECTOMY  DEBRIDEMENT AND CHONDROPLASTY ;  Surgeon: Gearlean Alf, MD;  Location: [facility];  Service: Orthopedics;  Laterality: Left;  . THYROIDECTOMY Right 2013  . TONSILLECTOMY     childhood  . TOTAL KNEE ARTHROPLASTY Left 03/04/2016   Procedure: LEFT TOTAL KNEE ARTHROPLASTY;  Surgeon: Gaynelle Arabian, MD;  Location: [facility];  Service: Orthopedics;  Laterality: Left;    There were no vitals filed for this visit.      Subjective Assessment - 04/28/16 1354    Subjective Reports that he is not ascending or descending stairs normally yet but without AD. Reports that he had throbbing pain intermittantly  along where his knee is numb.   Pertinent History H/o LBP.   Patient Stated Goals Get knee back to normal.   Currently in Pain? Yes   Pain Score 5    Pain Location Knee   Pain Orientation Left   Pain Descriptors / Indicators Other (Comment)  "weak"   Pain Type Surgical pain   Pain Onset More than a month ago            [facility] PT Assessment - 04/28/16 0001      Assessment   Medical Diagnosis left total knee replacement.   Onset Date/Surgical Date 03/04/16   Next MD Visit 05/14/2016     Restrictions   Weight Bearing Restrictions No     Circumferential Edema   Circumferential - Right 41.3 cm   Circumferential - Left  44.2 cm     ROM / Strength   AROM / PROM / Strength AROM     AROM   Overall AROM  Deficits   AROM Assessment Site Knee   Right/Left Knee Left   Left Knee Extension -12                     [facility] Adult PT Treatment/Exercise - 04/28/16 0001      Knee/Hip Exercises: Aerobic  Stationary Bike L7 x15 min     Knee/Hip Exercises: Standing   Terminal Knee Extension Limitations LLE green theraband x20 reps with 3 sec hold   Lateral Step Up Left;3 sets;10 reps;Hand Hold: 0;Step Height: 6"   Forward Step Up Left;3 sets;10 reps;Hand Hold: 2;Step Height: 8"     Modalities   Modalities Vasopneumatic     Vasopneumatic   Number Minutes Vasopneumatic  15 minutes   Vasopnuematic Location  Knee   Vasopneumatic Pressure Medium   Vasopneumatic Temperature  60     Manual Therapy   Manual Therapy Passive ROM;Soft tissue mobilization   Soft tissue mobilization L patella mobilizations into ant/post, med/lat directions to promote proper mobility   Passive ROM PROM of L knee into extension with gentle holds at end range to promote increased ROM                     PT Long Term Goals - 04/21/16 1113      PT LONG TERM GOAL #1   Title Independent with a HEP.   Time 4   Period Weeks   Status Achieved     PT LONG TERM GOAL #2   Title Full  active left knee extension.   Time 4   Period Weeks   Status On-going  AROM -12 degrees from neutral  04/21/16     PT LONG TERM GOAL #3   Title Active left knee flexion to 115 degrees+ so the patient can perform functional tasks and do so with pain not > 2-3/10.   Time 4   Period Weeks   Status Achieved  AROM 122 deg 04/21/2016     PT LONG TERM GOAL #4   Title Decrease edema to within 2 cms of non-affected side to assist with pain reduction and range of motion gains.   Time 4   Period Weeks   Status On-going  2.4 cm difference today with L > R 04/15/2016     PT LONG TERM GOAL #5   Title Perform a reciprocating stair gait with one railing with pain not > 2-3/10.   Time 4   Period Weeks   Status On-going     PT LONG TERM GOAL #6   Title Increase left knee strength to a solid 4+/5 to provide good stability for accomplishment of functional activities   Time 4   Period Weeks   Status On-going               Plan - 04/28/16 1422    Clinical Impression Statement Patient tolerated today's treatment fairly well as he was able to complete more exercises but developed posterior L knee discomfort during PROM into extension. Tone still palpated in L medial HS although reduced from previous treatments. 2.9 cm difference with L > R knee regarding edema. AROM of L knee extension measured as 12 deg from neutral today in supine with foot propped. Normal vasopneumatic response noted following removal of the modality. Goals remain on-going at this time regarding L knee extension, increased edema, lack of reciprical stair gait and deficits with L knee strength.    Rehab Potential Good   PT Frequency 3x / week   PT Duration 4 weeks   PT Treatment/Interventions ADLs/Self Care Home Management;Cryotherapy;Electrical Stimulation;Gait training;Stair training;Functional mobility training;Therapeutic activities;Therapeutic exercise;Vasopneumatic Device;Manual techniques;Passive range of motion   PT Next  Visit Plan Continue with L TKR protcol with edema control and modalities PRN (MD. Maureen Ralphs 05/14/16)    PT Home  Exercise Plan HEP- prone hangs, SAQ   Consulted and Agree with Plan of Care Patient      Patient will benefit from skilled therapeutic intervention in order to improve the following deficits and impairments:  Abnormal gait, Pain, Increased edema, Decreased activity tolerance, Decreased range of motion, Decreased strength, Decreased mobility  Visit Diagnosis: Acute pain of left knee  Stiffness of left knee, not elsewhere classified  Localized edema  Muscle weakness (generalized)     Problem List Patient Active Problem List   Diagnosis Date Noted  . OA (osteoarthritis) of knee 03/04/2016  . Acute medial meniscal tear 03/06/2014  . Cold hands and feet 08/23/2012    Wynelle Fanny, PTA 04/28/2016, 2:37 PM  [facility] [address] [place], [place], [address] Phone: [phone]   Fax:  [phone]  Name: Thomas Pham MRN: [id number] Date of Birth: 01-06-1956

## 2016-04-29 ENCOUNTER — Ambulatory Visit: Payer: BLUE CROSS/BLUE SHIELD | Admitting: Physical Therapy

## 2016-04-29 ENCOUNTER — Encounter: Payer: Self-pay | Admitting: Physical Therapy

## 2016-04-29 DIAGNOSIS — M25562 Pain in left knee: Secondary | ICD-10-CM

## 2016-04-29 DIAGNOSIS — M25662 Stiffness of left knee, not elsewhere classified: Secondary | ICD-10-CM

## 2016-04-29 DIAGNOSIS — M6281 Muscle weakness (generalized): Secondary | ICD-10-CM

## 2016-04-29 DIAGNOSIS — R600 Localized edema: Secondary | ICD-10-CM

## 2016-04-29 NOTE — Therapy (Signed)
[facility] [place], [place], [address] Phone: [phone]   Fax:  [phone]  Physical Therapy Treatment  Patient Details  Name: Thomas Pham MRN: [id number] Date of Birth: 05/04/1955 Referring Provider: Gaynelle Arabian MD.  Encounter Date: 04/29/2016      PT End of Session - 04/29/16 1436    Visit Number 22   Number of Visits 24   Date for PT Re-Evaluation 05/08/16   PT Start Time 4287   PT Stop Time 1455   PT Time Calculation (min) 57 min   Activity Tolerance Patient tolerated treatment well   Behavior During Therapy [facility] for tasks assessed/performed      Past Medical History:  Diagnosis Date  . Allergy   . Arthritis    osteo  . Chronic kidney disease   . Difficulty sleeping   . GERD (gastroesophageal reflux disease)    takes nexium for acid reflux as needed  . History of gout   . History of hiatal hernia    umbilical hernia  . History of kidney stones   . Hyperlipidemia   . Hypertension   . Hypothyroidism   . Low back pain   . Medial meniscus tear    LEFT  . Organic impotence   . Penile cyst   . Sleep apnea    not using mask d/t preference  . Thyroid disease     Past Surgical History:  Procedure Laterality Date  . KNEE ARTHROSCOPY Left 03/07/2014   Procedure: LEFT ARTHROSCOPY KNEE WITH MEDIAL MENISCECTOMY  DEBRIDEMENT AND CHONDROPLASTY ;  Surgeon: Gearlean Alf, MD;  Location: [facility];  Service: Orthopedics;  Laterality: Left;  . THYROIDECTOMY Right 2013  . TONSILLECTOMY     childhood  . TOTAL KNEE ARTHROPLASTY Left 03/04/2016   Procedure: LEFT TOTAL KNEE ARTHROPLASTY;  Surgeon: Gaynelle Arabian, MD;  Location: [facility];  Service: Orthopedics;  Laterality: Left;    There were no vitals filed for this visit.      Subjective Assessment - 04/29/16 1358    Subjective Patient reported ongoing improvement and useing JAS brace as instructed   Pertinent History H/o LBP.   Patient Stated Goals Get  knee back to normal.   Currently in Pain? Yes   Pain Score 5    Pain Location Knee   Pain Orientation Left   Pain Descriptors / Indicators Sore   Pain Type Surgical pain   Pain Onset More than a month ago   Pain Frequency Intermittent   Aggravating Factors  prolong activity   Pain Relieving Factors at rest            [facility] PT Assessment - 04/29/16 0001      AROM   AROM Assessment Site Knee   Right/Left Knee Left   Left Knee Extension -12     PROM   PROM Assessment Site Knee   Right/Left Knee Left   Left Knee Extension -8                     [facility] Adult PT Treatment/Exercise - 04/29/16 0001      Knee/Hip Exercises: Aerobic   Stationary Bike L7 x15 min     Knee/Hip Exercises: Standing   Lateral Step Up Left;3 sets;10 reps;Hand Hold: 0;Step Height: 8"   Forward Step Up Step Height: 8"   Rocker Board 2 minutes     Vasopneumatic   Number Minutes Vasopneumatic  15 minutes   Vasopnuematic Location  Knee  Vasopneumatic Pressure Medium     Manual Therapy   Manual Therapy Passive ROM   Passive ROM PROM of L knee into extension with gentle holds at end range to promote increased ROM                     PT Long Term Goals - 04/21/16 1113      PT LONG TERM GOAL #1   Title Independent with a HEP.   Time 4   Period Weeks   Status Achieved     PT LONG TERM GOAL #2   Title Full active left knee extension.   Time 4   Period Weeks   Status On-going  AROM -12 degrees from neutral  04/21/16     PT LONG TERM GOAL #3   Title Active left knee flexion to 115 degrees+ so the patient can perform functional tasks and do so with pain not > 2-3/10.   Time 4   Period Weeks   Status Achieved  AROM 122 deg 04/21/2016     PT LONG TERM GOAL #4   Title Decrease edema to within 2 cms of non-affected side to assist with pain reduction and range of motion gains.   Time 4   Period Weeks   Status On-going  2.4 cm difference today with L > R 04/15/2016      PT LONG TERM GOAL #5   Title Perform a reciprocating stair gait with one railing with pain not > 2-3/10.   Time 4   Period Weeks   Status On-going     PT LONG TERM GOAL #6   Title Increase left knee strength to a solid 4+/5 to provide good stability for accomplishment of functional activities   Time 4   Period Weeks   Status On-going               Plan - 04/29/16 1437    Clinical Impression Statement Patient tolerated treatment well and is progressing overall. Patient improved with ROM for left knee ext. Patient continues to use JAS brace as instructed. Patient progressing with strengthening. Goals ongoing deu full knee ext limitations and strength deficts.    Rehab Potential Good   PT Frequency 3x / week   PT Duration 4 weeks   PT Treatment/Interventions ADLs/Self Care Home Management;Cryotherapy;Electrical Stimulation;Gait training;Stair training;Functional mobility training;Therapeutic activities;Therapeutic exercise;Vasopneumatic Device;Manual techniques;Passive range of motion   PT Next Visit Plan Continue with L TKR protcol with edema control and modalities PRN (MD. Maureen Ralphs 05/14/16)    Consulted and Agree with Plan of Care Patient      Patient will benefit from skilled therapeutic intervention in order to improve the following deficits and impairments:  Abnormal gait, Pain, Increased edema, Decreased activity tolerance, Decreased range of motion, Decreased strength, Decreased mobility  Visit Diagnosis: Acute pain of left knee  Stiffness of left knee, not elsewhere classified  Localized edema  Muscle weakness (generalized)     Problem List Patient Active Problem List   Diagnosis Date Noted  . OA (osteoarthritis) of knee 03/04/2016  . Acute medial meniscal tear 03/06/2014  . Cold hands and feet 08/23/2012    Ardice Boyan P, PTA 04/29/2016, 2:57 PM  [facility] [place], [place], [address] Phone:  [phone]   Fax:  [phone]  Name: DAYYAN KRIST MRN: [id number] Date of Birth: January 14, 1956

## 2016-05-04 ENCOUNTER — Encounter: Payer: Self-pay | Admitting: Physical Therapy

## 2016-05-05 ENCOUNTER — Encounter: Payer: Self-pay | Admitting: Physical Therapy

## 2016-05-06 ENCOUNTER — Encounter: Payer: Self-pay | Admitting: Physical Therapy

## 2016-05-07 ENCOUNTER — Ambulatory Visit: Payer: BLUE CROSS/BLUE SHIELD | Admitting: Physical Therapy

## 2016-05-07 ENCOUNTER — Encounter: Payer: Self-pay | Admitting: Physical Therapy

## 2016-05-07 DIAGNOSIS — R600 Localized edema: Secondary | ICD-10-CM

## 2016-05-07 DIAGNOSIS — M25562 Pain in left knee: Secondary | ICD-10-CM

## 2016-05-07 DIAGNOSIS — M25662 Stiffness of left knee, not elsewhere classified: Secondary | ICD-10-CM

## 2016-05-07 DIAGNOSIS — M6281 Muscle weakness (generalized): Secondary | ICD-10-CM

## 2016-05-07 NOTE — Therapy (Signed)
[facility] [place], [place], [address] Phone: [phone]   Fax:  [phone]  Physical Therapy Treatment  Patient Details  Name: Thomas Pham MRN: [id number] Date of Birth: 1955/03/31 Referring Provider: Gaynelle Arabian MD.  Encounter Date: 05/07/2016      PT End of Session - 05/07/16 1520    Visit Number 23   Number of Visits 24   Date for PT Re-Evaluation 05/08/16   PT Start Time 5809   PT Stop Time 1613   PT Time Calculation (min) 58 min   Activity Tolerance Patient tolerated treatment well   Behavior During Therapy [facility] for tasks assessed/performed      Past Medical History:  Diagnosis Date  . Allergy   . Arthritis    osteo  . Chronic kidney disease   . Difficulty sleeping   . GERD (gastroesophageal reflux disease)    takes nexium for acid reflux as needed  . History of gout   . History of hiatal hernia    umbilical hernia  . History of kidney stones   . Hyperlipidemia   . Hypertension   . Hypothyroidism   . Low back pain   . Medial meniscus tear    LEFT  . Organic impotence   . Penile cyst   . Sleep apnea    not using mask d/t preference  . Thyroid disease     Past Surgical History:  Procedure Laterality Date  . KNEE ARTHROSCOPY Left 03/07/2014   Procedure: LEFT ARTHROSCOPY KNEE WITH MEDIAL MENISCECTOMY  DEBRIDEMENT AND CHONDROPLASTY ;  Surgeon: Gearlean Alf, MD;  Location: [facility];  Service: Orthopedics;  Laterality: Left;  . THYROIDECTOMY Right 2013  . TONSILLECTOMY     childhood  . TOTAL KNEE ARTHROPLASTY Left 03/04/2016   Procedure: LEFT TOTAL KNEE ARTHROPLASTY;  Surgeon: Gaynelle Arabian, MD;  Location: [facility];  Service: Orthopedics;  Laterality: Left;    There were no vitals filed for this visit.      Subjective Assessment - 05/07/16 1518    Subjective Reports that his knee is not doing good since 05/01/2016 when he was completing exercises and flared up pain that has been the worst  then previous flare ups. Was trying HS stretch and forward step ups with increased pain. Reports not using JAS brace since 05/01/2016.   Pertinent History H/o LBP.   Patient Stated Goals Get knee back to normal.   Currently in Pain? Other (Comment)  Did not provide pain assessment            [facility] PT Assessment - 05/07/16 0001      Assessment   Medical Diagnosis left total knee replacement.   Onset Date/Surgical Date 03/04/16     Restrictions   Weight Bearing Restrictions No     Circumferential Edema   Circumferential - Right 41 cm   Circumferential - Left  44.1 cm     AROM   Overall AROM  Deficits   AROM Assessment Site Knee   Right/Left Knee Left   Left Knee Extension -13   Left Knee Flexion 120                     [facility] Adult PT Treatment/Exercise - 05/07/16 0001      Knee/Hip Exercises: Aerobic   Stationary Bike L7 x15 min     Knee/Hip Exercises: Standing   Forward Step Up Left;Hand Hold: 2;Step Height: 6"  1 rep but stopped secondary to pain  Rocker Board 3 minutes     Modalities   Modalities Vasopneumatic     Vasopneumatic   Number Minutes Vasopneumatic  15 minutes   Vasopnuematic Location  Knee   Vasopneumatic Pressure Medium   Vasopneumatic Temperature  41     Manual Therapy   Manual Therapy Myofascial release   Myofascial Release IASTW to distal L Quad to reduce adhesions and pain                     PT Long Term Goals - 04/21/16 1113      PT LONG TERM GOAL #1   Title Independent with a HEP.   Time 4   Period Weeks   Status Achieved     PT LONG TERM GOAL #2   Title Full active left knee extension.   Time 4   Period Weeks   Status On-going  AROM -12 degrees from neutral  04/21/16     PT LONG TERM GOAL #3   Title Active left knee flexion to 115 degrees+ so the patient can perform functional tasks and do so with pain not > 2-3/10.   Time 4   Period Weeks   Status Achieved  AROM 122 deg 04/21/2016     PT LONG  TERM GOAL #4   Title Decrease edema to within 2 cms of non-affected side to assist with pain reduction and range of motion gains.   Time 4   Period Weeks   Status On-going  2.4 cm difference today with L > R 04/15/2016     PT LONG TERM GOAL #5   Title Perform a reciprocating stair gait with one railing with pain not > 2-3/10.   Time 4   Period Weeks   Status On-going     PT LONG TERM GOAL #6   Title Increase left knee strength to a solid 4+/5 to provide good stability for accomplishment of functional activities   Time 4   Period Weeks   Status On-going               Plan - 05/07/16 1737    Clinical Impression Statement Patient limited today in treatment secondary to superior L knee pain that began 05/01/2016. Patient experienced discomfort with full revolutions on stationary bike but had no negetive response to rockerboard per patient report. Patient interested in attempting forward step up but unable to complete more than 1-3 reps secondary to pain. AROM of L knee measured as 12-120 deg. 3 cm difference in edema comparison with L > R. IASTW completed today in hopes of reducing tone and pain experienced in superior L knee. An area of tightness noted horizontally in distal Quad in superior region of incision that reduced slightly with IASTW and other small areas of tone reduced during treatment. Normal vasopnuematic response noted following removal of the modalities.   Rehab Potential Good   PT Frequency 3x / week   PT Duration 4 weeks   PT Treatment/Interventions ADLs/Self Care Home Management;Cryotherapy;Electrical Stimulation;Gait training;Stair training;Functional mobility training;Therapeutic activities;Therapeutic exercise;Vasopneumatic Device;Manual techniques;Passive range of motion   PT Next Visit Plan Continue with L TKR protcol with edema control and modalities PRN (MD. Maureen Ralphs 05/14/16)    PT Home Exercise Plan HEP- prone hangs, SAQ   Consulted and Agree with Plan of Care  Patient      Patient will benefit from skilled therapeutic intervention in order to improve the following deficits and impairments:  Abnormal gait, Pain, Increased edema, Decreased activity tolerance,  Decreased range of motion, Decreased strength, Decreased mobility  Visit Diagnosis: Acute pain of left knee  Stiffness of left knee, not elsewhere classified  Localized edema  Muscle weakness (generalized)     Problem List Patient Active Problem List   Diagnosis Date Noted  . OA (osteoarthritis) of knee 03/04/2016  . Acute medial meniscal tear 03/06/2014  . Cold hands and feet 08/23/2012    Wynelle Fanny, PTA 05/07/2016, 5:49 PM  [facility] [address] [place], [place], [address] Phone: [phone]   Fax:  [phone]  Name: Thomas Pham MRN: [id number] Date of Birth: 05/27/1955

## 2016-05-08 ENCOUNTER — Ambulatory Visit: Payer: BLUE CROSS/BLUE SHIELD | Admitting: *Deleted

## 2016-05-08 DIAGNOSIS — M25562 Pain in left knee: Secondary | ICD-10-CM

## 2016-05-08 DIAGNOSIS — R600 Localized edema: Secondary | ICD-10-CM

## 2016-05-08 DIAGNOSIS — M25662 Stiffness of left knee, not elsewhere classified: Secondary | ICD-10-CM

## 2016-05-08 DIAGNOSIS — M6281 Muscle weakness (generalized): Secondary | ICD-10-CM

## 2016-05-08 NOTE — Therapy (Signed)
[facility] [place], [place], [address] Phone: [phone]   Fax:  [phone]  Physical Therapy Treatment  Patient Details  Name: Thomas Pham MRN: [id number] Date of Birth: 15-May-1955 Referring Provider: Gaynelle Arabian MD.  Encounter Date: 05/08/2016      PT End of Session - 05/08/16 0910    Visit Number 24   Number of Visits 30   Date for PT Re-Evaluation 05/29/16   PT Start Time 0900   PT Stop Time 0950   PT Time Calculation (min) 50 min      Past Medical History:  Diagnosis Date  . Allergy   . Arthritis    osteo  . Chronic kidney disease   . Difficulty sleeping   . GERD (gastroesophageal reflux disease)    takes nexium for acid reflux as needed  . History of gout   . History of hiatal hernia    umbilical hernia  . History of kidney stones   . Hyperlipidemia   . Hypertension   . Hypothyroidism   . Low back pain   . Medial meniscus tear    LEFT  . Organic impotence   . Penile cyst   . Sleep apnea    not using mask d/t preference  . Thyroid disease     Past Surgical History:  Procedure Laterality Date  . KNEE ARTHROSCOPY Left 03/07/2014   Procedure: LEFT ARTHROSCOPY KNEE WITH MEDIAL MENISCECTOMY  DEBRIDEMENT AND CHONDROPLASTY ;  Surgeon: Gearlean Alf, MD;  Location: [facility];  Service: Orthopedics;  Laterality: Left;  . THYROIDECTOMY Right 2013  . TONSILLECTOMY     childhood  . TOTAL KNEE ARTHROPLASTY Left 03/04/2016   Procedure: LEFT TOTAL KNEE ARTHROPLASTY;  Surgeon: Gaynelle Arabian, MD;  Location: [facility];  Service: Orthopedics;  Laterality: Left;    There were no vitals filed for this visit.      Subjective Assessment - 05/08/16 0856    Subjective Reports that his knee is not doing good since 05/01/2016 when he was completing exercises and flared up pain that has been the worst then previous flare ups. Was trying HS stretch and forward step ups with increased pain. Reports not using JAS  brace since 05/01/2016.   Pertinent History H/o LBP.   Patient Stated Goals Get knee back to normal.                         [facility] Adult PT Treatment/Exercise - 05/08/16 0001      Knee/Hip Exercises: Aerobic   Stationary Bike L7 x15 min     Knee/Hip Exercises: Standing   Forward Step Up Left;Hand Hold: 2;Step Height: 6"  1 rep but stopped secondary to pain   Rocker Board 3 minutes     Modalities   Modalities Vasopneumatic     Vasopneumatic   Number Minutes Vasopneumatic  15 minutes   Vasopnuematic Location  Knee   Vasopneumatic Pressure Medium   Vasopneumatic Temperature  41     Manual Therapy   Manual Therapy Myofascial release   Myofascial Release --   Passive ROM PROM of L knee into extension with gentle holds at end range to promote increased ROM                     PT Long Term Goals - 04/21/16 1113      PT LONG TERM GOAL #1   Title Independent with a HEP.   Time  4   Period Weeks   Status Achieved     PT LONG TERM GOAL #2   Title Full active left knee extension.   Time 4   Period Weeks   Status On-going  AROM -12 degrees from neutral  04/21/16     PT LONG TERM GOAL #3   Title Active left knee flexion to 115 degrees+ so the patient can perform functional tasks and do so with pain not > 2-3/10.   Time 4   Period Weeks   Status Achieved  AROM 122 deg 04/21/2016     PT LONG TERM GOAL #4   Title Decrease edema to within 2 cms of non-affected side to assist with pain reduction and range of motion gains.   Time 4   Period Weeks   Status On-going  2.4 cm difference today with L > R 04/15/2016     PT LONG TERM GOAL #5   Title Perform a reciprocating stair gait with one railing with pain not > 2-3/10.   Time 4   Period Weeks   Status On-going     PT LONG TERM GOAL #6   Title Increase left knee strength to a solid 4+/5 to provide good stability for accomplishment of functional activities   Time 4   Period Weeks   Status On-going                Plan - 05/08/16 0913    Clinical Impression Statement Pt did fair today, but continues to have pain in distal quad from recent strain on 05-01-16. He was able to ride stationary bike better today, but unable to perform any step-ups due to quad pain. His ROM was 12-120 degrees and focus was on extension ROM.     Rehab Potential Good   PT Treatment/Interventions ADLs/Self Care Home Management;Cryotherapy;Electrical Stimulation;Gait training;Stair training;Functional mobility training;Therapeutic activities;Therapeutic exercise;Vasopneumatic Device;Manual techniques;Passive range of motion   PT Next Visit Plan Continue with L TKR protcol with edema control and modalities PRN (MD. Maureen Ralphs 1/77/93)    send recert 6 more visits   PT Home Exercise Plan HEP- prone hangs, SAQ   Consulted and Agree with Plan of Care Patient      Patient will benefit from skilled therapeutic intervention in order to improve the following deficits and impairments:  Abnormal gait, Pain, Increased edema, Decreased activity tolerance, Decreased range of motion, Decreased strength, Decreased mobility  Visit Diagnosis: Acute pain of left knee - Plan: PT plan of care cert/re-cert  Localized edema - Plan: PT plan of care cert/re-cert  Muscle weakness (generalized) - Plan: PT plan of care cert/re-cert  Stiffness of left knee, not elsewhere classified - Plan: PT plan of care cert/re-cert     Problem List Patient Active Problem List   Diagnosis Date Noted  . OA (osteoarthritis) of knee 03/04/2016  . Acute medial meniscal tear 03/06/2014  . Cold hands and feet 08/23/2012    RAMSEUR,CHRIS, PTA 05/08/2016, 1:30 PM [place] App[place] MPT [facility] [address] [place], [place], [address] Phone: [phone]   Fax:  [phone]  Name: Thomas Pham MRN: [id number] Date of Birth: 14-Dec-1955

## 2016-05-11 ENCOUNTER — Encounter: Payer: Self-pay | Admitting: Physical Therapy

## 2016-05-13 ENCOUNTER — Encounter: Payer: Self-pay | Admitting: Physical Therapy

## 2016-05-21 ENCOUNTER — Encounter: Payer: Self-pay | Admitting: Physical Therapy

## 2016-05-21 ENCOUNTER — Ambulatory Visit: Payer: BLUE CROSS/BLUE SHIELD | Attending: Orthopedic Surgery | Admitting: Physical Therapy

## 2016-05-21 DIAGNOSIS — M6281 Muscle weakness (generalized): Secondary | ICD-10-CM

## 2016-05-21 DIAGNOSIS — M25662 Stiffness of left knee, not elsewhere classified: Secondary | ICD-10-CM

## 2016-05-21 DIAGNOSIS — M25562 Pain in left knee: Secondary | ICD-10-CM | POA: Diagnosis not present

## 2016-05-21 DIAGNOSIS — R600 Localized edema: Secondary | ICD-10-CM

## 2016-05-21 NOTE — Patient Instructions (Addendum)
Terminal Knee Extension (Standing)    Facing anchor with right knee slightly bent and tubing just above knee, gently pull knee back straight. Do not overextend knee. Repeat _20-30___ times per set. Do _1-2___ sets per session. Do __1-2__ sessions per day.

## 2016-05-21 NOTE — Therapy (Signed)
[facility] [place], [place], [address] Phone: [phone]   Fax:  [phone]  Physical Therapy Treatment  Patient Details  Name: Thomas Pham MRN: [id number] Date of Birth: 02/22/1955 Referring Provider: Gaynelle Arabian MD.  Encounter Date: 05/21/2016      PT End of Session - 05/21/16 0938    Visit Number 25   Number of Visits 30   Date for PT Re-Evaluation 06/11/16   PT Start Time 0901   PT Stop Time 0957   PT Time Calculation (min) 56 min   Activity Tolerance Patient tolerated treatment well   Behavior During Therapy [facility] for tasks assessed/performed      Past Medical History:  Diagnosis Date  . Allergy   . Arthritis    osteo  . Chronic kidney disease   . Difficulty sleeping   . GERD (gastroesophageal reflux disease)    takes nexium for acid reflux as needed  . History of gout   . History of hiatal hernia    umbilical hernia  . History of kidney stones   . Hyperlipidemia   . Hypertension   . Hypothyroidism   . Low back pain   . Medial meniscus tear    LEFT  . Organic impotence   . Penile cyst   . Sleep apnea    not using mask d/t preference  . Thyroid disease     Past Surgical History:  Procedure Laterality Date  . KNEE ARTHROSCOPY Left 03/07/2014   Procedure: LEFT ARTHROSCOPY KNEE WITH MEDIAL MENISCECTOMY  DEBRIDEMENT AND CHONDROPLASTY ;  Surgeon: Gearlean Alf, MD;  Location: [facility];  Service: Orthopedics;  Laterality: Left;  . THYROIDECTOMY Right 2013  . TONSILLECTOMY     childhood  . TOTAL KNEE ARTHROPLASTY Left 03/04/2016   Procedure: LEFT TOTAL KNEE ARTHROPLASTY;  Surgeon: Gaynelle Arabian, MD;  Location: [facility];  Service: Orthopedics;  Laterality: Left;    There were no vitals filed for this visit.      Subjective Assessment - 05/21/16 0908    Subjective Patient went to MD and is to continue 1x a week for 4 weeks. Has some ongoing pain after self stretching yet better overall    Pertinent History H/o LBP.   Patient Stated Goals Get knee back to normal.   Currently in Pain? Yes   Pain Score 5    Pain Location Knee   Pain Orientation Left   Pain Descriptors / Indicators Sore   Pain Type Surgical pain   Pain Onset More than a month ago   Pain Frequency Intermittent   Aggravating Factors  prolong activity   Pain Relieving Factors at rest            [facility] PT Assessment - 05/21/16 0001      Circumferential Edema   Circumferential - Right 41   Circumferential - Left  44     AROM   AROM Assessment Site Knee   Right/Left Knee Left   Left Knee Extension -8     PROM   PROM Assessment Site Knee   Right/Left Knee Left   Left Knee Extension -13                     [facility] Adult PT Treatment/Exercise - 05/21/16 0001      Knee/Hip Exercises: Aerobic   Stationary Bike L7 x15 min     Knee/Hip Exercises: Standing   Terminal Knee Extension Limitations LLE green theraband x20 reps with  3 sec hold   Rocker Board 3 minutes     Vasopneumatic   Number Minutes Vasopneumatic  15 minutes   Vasopnuematic Location  Knee   Vasopneumatic Pressure Medium     Manual Therapy   Manual Therapy Passive ROM;Soft tissue mobilization   Passive ROM PROM of L knee into extension with gentle holds at end range to promote increased ROM                PT Education - 05/21/16 0941    Education provided Yes   Education Details HEP TKE with green t-band   Person(s) Educated Patient   Methods Explanation;Demonstration;Handout   Comprehension Verbalized understanding;Returned demonstration             PT Long Term Goals - 04/21/16 1113      PT LONG TERM GOAL #1   Title Independent with a HEP.   Time 4   Period Weeks   Status Achieved     PT LONG TERM GOAL #2   Title Full active left knee extension.   Time 4   Period Weeks   Status On-going  AROM -12 degrees from neutral  04/21/16     PT LONG TERM GOAL #3   Title Active left knee flexion to  115 degrees+ so the patient can perform functional tasks and do so with pain not > 2-3/10.   Time 4   Period Weeks   Status Achieved  AROM 122 deg 04/21/2016     PT LONG TERM GOAL #4   Title Decrease edema to within 2 cms of non-affected side to assist with pain reduction and range of motion gains.   Time 4   Period Weeks   Status On-going  2.4 cm difference today with L > R 04/15/2016     PT LONG TERM GOAL #5   Title Perform a reciprocating stair gait with one railing with pain not > 2-3/10.   Time 4   Period Weeks   Status On-going     PT LONG TERM GOAL #6   Title Increase left knee strength to a solid 4+/5 to provide good stability for accomplishment of functional activities   Time 4   Period Weeks   Status On-going               Plan - 05/21/16 0941    Clinical Impression Statement Patient progressing well and continues to tolerate treatment well. Patient has reported a set back after self stretching too much and caused a flare up of pain inferior knee cap area that ha subsidded. Patient reported using the JAS brace daily and would like to progress strengthening gradually. remaining goals ongoing due to ext deficts and strength limitations.    Rehab Potential Good   PT Frequency 3x / week   PT Duration 4 weeks   PT Treatment/Interventions ADLs/Self Care Home Management;Cryotherapy;Electrical Stimulation;Gait training;Stair training;Functional mobility training;Therapeutic activities;Therapeutic exercise;Vasopneumatic Device;Manual techniques;Passive range of motion   PT Next Visit Plan cont with POC per MD 1xweek for 4 weeks   Consulted and Agree with Plan of Care Patient      Patient will benefit from skilled therapeutic intervention in order to improve the following deficits and impairments:  Abnormal gait, Pain, Increased edema, Decreased activity tolerance, Decreased range of motion, Decreased strength, Decreased mobility  Visit Diagnosis: Acute pain of left  knee  Localized edema  Muscle weakness (generalized)  Stiffness of left knee, not elsewhere classified     Problem List Patient  Active Problem List   Diagnosis Date Noted  . OA (osteoarthritis) of knee 03/04/2016  . Acute medial meniscal tear 03/06/2014  . Cold hands and feet 08/23/2012    Briannia Laba P, PTA 05/21/2016, 9:57 AM  [facility] [place], [place], [address] Phone: [phone]   Fax:  [phone]  Name: Thomas Pham MRN: [id number] Date of Birth: 1955/12/10

## 2016-05-28 ENCOUNTER — Ambulatory Visit: Payer: BLUE CROSS/BLUE SHIELD | Admitting: Physical Therapy

## 2016-05-28 ENCOUNTER — Encounter: Payer: Self-pay | Admitting: Physical Therapy

## 2016-05-28 DIAGNOSIS — M25562 Pain in left knee: Secondary | ICD-10-CM

## 2016-05-28 DIAGNOSIS — R600 Localized edema: Secondary | ICD-10-CM

## 2016-05-28 DIAGNOSIS — M6281 Muscle weakness (generalized): Secondary | ICD-10-CM

## 2016-05-28 DIAGNOSIS — M25662 Stiffness of left knee, not elsewhere classified: Secondary | ICD-10-CM

## 2016-05-28 NOTE — Therapy (Signed)
[facility] [place], [place], [address] Phone: [phone]   Fax:  [phone]  Physical Therapy Treatment  Patient Details  Name: Thomas Pham MRN: [id number] Date of Birth: Apr 19, 1955 Referring Provider: Gaynelle Arabian MD.  Encounter Date: 05/28/2016      PT End of Session - 05/28/16 1316    Visit Number 26   Number of Visits 30   Date for PT Re-Evaluation 06/11/16   PT Start Time 4268   PT Stop Time 1330   PT Time Calculation (min) 59 min   Activity Tolerance Patient tolerated treatment well   Behavior During Therapy [facility] for tasks assessed/performed      Past Medical History:  Diagnosis Date  . Allergy   . Arthritis    osteo  . Chronic kidney disease   . Difficulty sleeping   . GERD (gastroesophageal reflux disease)    takes nexium for acid reflux as needed  . History of gout   . History of hiatal hernia    umbilical hernia  . History of kidney stones   . Hyperlipidemia   . Hypertension   . Hypothyroidism   . Low back pain   . Medial meniscus tear    LEFT  . Organic impotence   . Penile cyst   . Sleep apnea    not using mask d/t preference  . Thyroid disease     Past Surgical History:  Procedure Laterality Date  . KNEE ARTHROSCOPY Left 03/07/2014   Procedure: LEFT ARTHROSCOPY KNEE WITH MEDIAL MENISCECTOMY  DEBRIDEMENT AND CHONDROPLASTY ;  Surgeon: Gearlean Alf, MD;  Location: [facility];  Service: Orthopedics;  Laterality: Left;  . THYROIDECTOMY Right 2013  . TONSILLECTOMY     childhood  . TOTAL KNEE ARTHROPLASTY Left 03/04/2016   Procedure: LEFT TOTAL KNEE ARTHROPLASTY;  Surgeon: Gaynelle Arabian, MD;  Location: [facility];  Service: Orthopedics;  Laterality: Left;    There were no vitals filed for this visit.      Subjective Assessment - 05/28/16 1237    Subjective Patient had no new complaints, doing HEP and JAS brace daily   Pertinent History H/o LBP.   Patient Stated Goals Get knee back to  normal.   Currently in Pain? Yes   Pain Score 4    Pain Location Knee   Pain Orientation Left   Pain Descriptors / Indicators Sore   Pain Type Surgical pain   Pain Onset More than a month ago   Pain Frequency Intermittent   Aggravating Factors  prolong activity   Pain Relieving Factors at rest            [facility] PT Assessment - 05/28/16 0001      Circumferential Edema   Circumferential - Left  44     AROM   AROM Assessment Site Knee   Right/Left Knee Left   Left Knee Extension -11     PROM   PROM Assessment Site Knee   Right/Left Knee Left   Left Knee Extension -6                     [facility] Adult PT Treatment/Exercise - 05/28/16 0001      Knee/Hip Exercises: Aerobic   Stationary Bike L7 x15 min     Knee/Hip Exercises: Standing   Terminal Knee Extension Limitations LLE green theraband x20 reps with 3 sec hold   Rocker Board 3 minutes     Vasopneumatic   Number Minutes Vasopneumatic  15 minutes   Vasopnuematic Location  Knee   Vasopneumatic Pressure Medium     Manual Therapy   Manual Therapy Passive ROM;Soft tissue mobilization   Passive ROM PROM of L knee into extension with gentle holds at end range to promote increased ROM                     PT Long Term Goals - 04/21/16 1113      PT LONG TERM GOAL #1   Title Independent with a HEP.   Time 4   Period Weeks   Status Achieved     PT LONG TERM GOAL #2   Title Full active left knee extension.   Time 4   Period Weeks   Status On-going  AROM -12 degrees from neutral  04/21/16     PT LONG TERM GOAL #3   Title Active left knee flexion to 115 degrees+ so the patient can perform functional tasks and do so with pain not > 2-3/10.   Time 4   Period Weeks   Status Achieved  AROM 122 deg 04/21/2016     PT LONG TERM GOAL #4   Title Decrease edema to within 2 cms of non-affected side to assist with pain reduction and range of motion gains.   Time 4   Period Weeks   Status On-going   2.4 cm difference today with L > R 04/15/2016     PT LONG TERM GOAL #5   Title Perform a reciprocating stair gait with one railing with pain not > 2-3/10.   Time 4   Period Weeks   Status On-going     PT LONG TERM GOAL #6   Title Increase left knee strength to a solid 4+/5 to provide good stability for accomplishment of functional activities   Time 4   Period Weeks   Status On-going               Plan - 05/28/16 1317    Clinical Impression Statement Patient tolerated treatment well today. Patient improved with knee ext ROM today. Patient was educated on starting SLR and progress to SLR with ER per tolerance for knee strengthening due to him not being able to perform LAQ and SAQ due to pain ant knee after those exercises per reported. Patient progressing toward goals. Full ext ROM defict and strength limitations.    Rehab Potential Good   PT Frequency 3x / week   PT Duration 4 weeks   PT Treatment/Interventions ADLs/Self Care Home Management;Cryotherapy;Electrical Stimulation;Gait training;Stair training;Functional mobility training;Therapeutic activities;Therapeutic exercise;Vasopneumatic Device;Manual techniques;Passive range of motion   PT Next Visit Plan cont with POC per MD 1xweek for 4 weeks   Consulted and Agree with Plan of Care Patient      Patient will benefit from skilled therapeutic intervention in order to improve the following deficits and impairments:  Abnormal gait, Pain, Increased edema, Decreased activity tolerance, Decreased range of motion, Decreased strength, Decreased mobility  Visit Diagnosis: Acute pain of left knee  Localized edema  Muscle weakness (generalized)  Stiffness of left knee, not elsewhere classified     Problem List Patient Active Problem List   Diagnosis Date Noted  . OA (osteoarthritis) of knee 03/04/2016  . Acute medial meniscal tear 03/06/2014  . Cold hands and feet 08/23/2012    DUNFORD, CHRISTINA P, PTA 05/28/2016,  1:34 PM  [facility] [place], [place], [address] Phone: [phone]   Fax:  [phone]  Name: TRESTAN VAHLE MRN: [id number] Date of Birth: 1955/07/11

## 2016-06-04 ENCOUNTER — Encounter: Payer: Self-pay | Admitting: Physical Therapy

## 2016-06-04 ENCOUNTER — Ambulatory Visit: Payer: BLUE CROSS/BLUE SHIELD | Admitting: Physical Therapy

## 2016-06-04 DIAGNOSIS — M6281 Muscle weakness (generalized): Secondary | ICD-10-CM

## 2016-06-04 DIAGNOSIS — M25662 Stiffness of left knee, not elsewhere classified: Secondary | ICD-10-CM

## 2016-06-04 DIAGNOSIS — M25562 Pain in left knee: Secondary | ICD-10-CM | POA: Diagnosis not present

## 2016-06-04 DIAGNOSIS — R600 Localized edema: Secondary | ICD-10-CM

## 2016-06-04 NOTE — Therapy (Signed)
[facility] [place], [place], [address] Phone: [phone]   Fax:  [phone]  Physical Therapy Treatment  Patient Details  Name: Thomas Pham MRN: [id number] Date of Birth: 05-Dec-1955 Referring Provider: Gaynelle Arabian MD.  Encounter Date: 06/04/2016      PT End of Session - 06/04/16 1310    Visit Number 27   Number of Visits 30   Date for PT Re-Evaluation 06/11/16   PT Start Time 1230   PT Stop Time 1330   PT Time Calculation (min) 60 min   Activity Tolerance Patient tolerated treatment well   Behavior During Therapy [facility] for tasks assessed/performed      Past Medical History:  Diagnosis Date  . Allergy   . Arthritis    osteo  . Chronic kidney disease   . Difficulty sleeping   . GERD (gastroesophageal reflux disease)    takes nexium for acid reflux as needed  . History of gout   . History of hiatal hernia    umbilical hernia  . History of kidney stones   . Hyperlipidemia   . Hypertension   . Hypothyroidism   . Low back pain   . Medial meniscus tear    LEFT  . Organic impotence   . Penile cyst   . Sleep apnea    not using mask d/t preference  . Thyroid disease     Past Surgical History:  Procedure Laterality Date  . KNEE ARTHROSCOPY Left 03/07/2014   Procedure: LEFT ARTHROSCOPY KNEE WITH MEDIAL MENISCECTOMY  DEBRIDEMENT AND CHONDROPLASTY ;  Surgeon: Gearlean Alf, MD;  Location: [facility];  Service: Orthopedics;  Laterality: Left;  . THYROIDECTOMY Right 2013  . TONSILLECTOMY     childhood  . TOTAL KNEE ARTHROPLASTY Left 03/04/2016   Procedure: LEFT TOTAL KNEE ARTHROPLASTY;  Surgeon: Gaynelle Arabian, MD;  Location: [facility];  Service: Orthopedics;  Laterality: Left;    There were no vitals filed for this visit.      Subjective Assessment - 06/04/16 1246    Subjective Patient had no new complaints, doing HEP and JAS brace daily   Pertinent History H/o LBP.   Patient Stated Goals Get knee back to  normal.   Currently in Pain? Yes   Pain Score 4    Pain Location Knee   Pain Orientation Left   Pain Descriptors / Indicators Sore   Pain Type Surgical pain   Pain Onset More than a month ago   Aggravating Factors  prolong activity   Pain Relieving Factors at rest            [facility] PT Assessment - 06/04/16 0001      Circumferential Edema   Circumferential - Left  44     AROM   AROM Assessment Site Knee   Right/Left Knee Left   Left Knee Extension -10     PROM   PROM Assessment Site Knee   Right/Left Knee Left   Left Knee Extension -5                     [facility] Adult PT Treatment/Exercise - 06/04/16 0001      Knee/Hip Exercises: Aerobic   Stationary Bike L7 x15 min     Knee/Hip Exercises: Standing   Terminal Knee Extension Limitations pink XTS x30   Rocker Board 3 minutes     Vasopneumatic   Number Minutes Vasopneumatic  15 minutes   Vasopnuematic Location  Knee  Vasopneumatic Pressure Medium     Manual Therapy   Manual Therapy Passive ROM;Soft tissue mobilization   Passive ROM PROM of L knee into extension with gentle holds at end range to promote increased ROM                     PT Long Term Goals - 04/21/16 1113      PT LONG TERM GOAL #1   Title Independent with a HEP.   Time 4   Period Weeks   Status Achieved     PT LONG TERM GOAL #2   Title Full active left knee extension.   Time 4   Period Weeks   Status On-going  AROM -12 degrees from neutral  04/21/16     PT LONG TERM GOAL #3   Title Active left knee flexion to 115 degrees+ so the patient can perform functional tasks and do so with pain not > 2-3/10.   Time 4   Period Weeks   Status Achieved  AROM 122 deg 04/21/2016     PT LONG TERM GOAL #4   Title Decrease edema to within 2 cms of non-affected side to assist with pain reduction and range of motion gains.   Time 4   Period Weeks   Status On-going  2.4 cm difference today with L > R 04/15/2016     PT LONG TERM  GOAL #5   Title Perform a reciprocating stair gait with one railing with pain not > 2-3/10.   Time 4   Period Weeks   Status On-going     PT LONG TERM GOAL #6   Title Increase left knee strength to a solid 4+/5 to provide good stability for accomplishment of functional activities   Time 4   Period Weeks   Status On-going               Plan - 06/04/16 1311    Clinical Impression Statement Patient tolerated treatment well today. Patient continues to improve with right knee ext active and passive ROM. Patient has reported ongoing self stretching with his JAS brace daily. Patient has occasional back pain from brace that limits him some. Patient remaining goals ongoing due to strength and ROM defcits.    Rehab Potential Good   PT Frequency 3x / week   PT Duration 4 weeks   PT Treatment/Interventions ADLs/Self Care Home Management;Cryotherapy;Electrical Stimulation;Gait training;Stair training;Functional mobility training;Therapeutic activities;Therapeutic exercise;Vasopneumatic Device;Manual techniques;Passive range of motion   PT Next Visit Plan cont with POC per MD 1xweek for 4 weeks   Consulted and Agree with Plan of Care Patient      Patient will benefit from skilled therapeutic intervention in order to improve the following deficits and impairments:  Abnormal gait, Pain, Increased edema, Decreased activity tolerance, Decreased range of motion, Decreased strength, Decreased mobility  Visit Diagnosis: Acute pain of left knee  Localized edema  Muscle weakness (generalized)  Stiffness of left knee, not elsewhere classified     Problem List Patient Active Problem List   Diagnosis Date Noted  . OA (osteoarthritis) of knee 03/04/2016  . Acute medial meniscal tear 03/06/2014  . Cold hands and feet 08/23/2012    Ivadell Gaul P, PTA 06/04/2016, 1:30 PM  [facility] [place], [place], [address] Phone:  [phone]   Fax:  [phone]  Name: Thomas Pham MRN: [id number] Date of Birth: [birth year]

## 2016-06-11 ENCOUNTER — Encounter: Payer: Self-pay | Admitting: Physical Therapy

## 2016-06-18 ENCOUNTER — Encounter: Payer: Self-pay | Admitting: Physical Therapy

## 2016-06-24 ENCOUNTER — Ambulatory Visit: Payer: BLUE CROSS/BLUE SHIELD | Attending: Orthopedic Surgery | Admitting: Physical Therapy

## 2016-06-24 ENCOUNTER — Encounter: Payer: Self-pay | Admitting: Physical Therapy

## 2016-06-24 DIAGNOSIS — R600 Localized edema: Secondary | ICD-10-CM | POA: Diagnosis present

## 2016-06-24 DIAGNOSIS — M25662 Stiffness of left knee, not elsewhere classified: Secondary | ICD-10-CM | POA: Insufficient documentation

## 2016-06-24 DIAGNOSIS — M25562 Pain in left knee: Secondary | ICD-10-CM | POA: Diagnosis present

## 2016-06-24 DIAGNOSIS — M6281 Muscle weakness (generalized): Secondary | ICD-10-CM | POA: Insufficient documentation

## 2016-06-24 NOTE — Therapy (Signed)
[facility] [address] [place], [place], [address] Phone: [phone]   Fax:  [phone]  Physical Therapy Treatment  Patient Details  Name: Thomas Pham MRN: [id number] Date of Birth: 07-11-1955 Referring Provider: Ollen Gross MD.  Encounter Date: 06/24/2016      PT End of Session - 06/24/16 1207    Visit Number 28   Number of Visits 30   Date for PT Re-Evaluation 06/11/16   PT Start Time 1115   PT Stop Time 1157   PT Time Calculation (min) 42 min   Activity Tolerance Patient tolerated treatment well   Behavior During Therapy [facility] for tasks assessed/performed      Past Medical History:  Diagnosis Date  . Allergy   . Arthritis    osteo  . Chronic kidney disease   . Difficulty sleeping   . GERD (gastroesophageal reflux disease)    takes nexium for acid reflux as needed  . History of gout   . History of hiatal hernia    umbilical hernia  . History of kidney stones   . Hyperlipidemia   . Hypertension   . Hypothyroidism   . Low back pain   . Medial meniscus tear    LEFT  . Organic impotence   . Penile cyst   . Sleep apnea    not using mask d/t preference  . Thyroid disease     Past Surgical History:  Procedure Laterality Date  . KNEE ARTHROSCOPY Left 03/07/2014   Procedure: LEFT ARTHROSCOPY KNEE WITH MEDIAL MENISCECTOMY  DEBRIDEMENT AND CHONDROPLASTY ;  Surgeon: Loanne Drilling, MD;  Location: [facility];  Service: Orthopedics;  Laterality: Left;  . THYROIDECTOMY Right 2013  . TONSILLECTOMY     childhood  . TOTAL KNEE ARTHROPLASTY Left 03/04/2016   Procedure: LEFT TOTAL KNEE ARTHROPLASTY;  Surgeon: Ollen Gross, MD;  Location: [facility];  Service: Orthopedics;  Laterality: Left;    There were no vitals filed for this visit.      Subjective Assessment - 06/24/16 1125    Subjective Patient reported today is last visit, he went to MD and overall improved    Pertinent History H/o LBP.   Patient Stated Goals Get  knee back to normal.   Currently in Pain? Yes   Pain Score 1    Pain Location Knee   Pain Orientation Left   Pain Descriptors / Indicators Sore   Pain Type Surgical pain   Pain Onset More than a month ago   Pain Frequency Intermittent   Aggravating Factors  prolong activity   Pain Relieving Factors at rest            [facility] PT Assessment - 06/24/16 0001      Circumferential Edema   Circumferential - Left  44     ROM / Strength   AROM / PROM / Strength AROM;PROM;Strength     AROM   AROM Assessment Site Knee   Right/Left Knee Left   Left Knee Extension -6     PROM   PROM Assessment Site Knee   Right/Left Knee Left   Left Knee Extension -3     Strength   Strength Assessment Site Knee;Hip   Right/Left Hip Left   Left Hip ABduction 5/5   Right/Left Knee Left   Left Knee Flexion 4+/5   Left Knee Extension 5/5                     [facility] Adult PT  Treatment/Exercise - 06/24/16 0001      Knee/Hip Exercises: Aerobic   Stationary Bike L7 x15 min     Knee/Hip Exercises: Standing   Terminal Knee Extension Limitations pink XTS x30     Manual Therapy   Manual Therapy Passive ROM   Passive ROM PROM of L knee into extension with gentle holds at end range to promote increased ROM                     PT Long Term Goals - 06/24/16 1204      PT LONG TERM GOAL #1   Title Independent with a HEP.   Period Weeks   Status Achieved     PT LONG TERM GOAL #2   Title Full active left knee extension.   Time 4   Period Weeks  AROM -6 degrees 06/24/16   Status Not Met     PT LONG TERM GOAL #3   Title Active left knee flexion to 115 degrees+ so the patient can perform functional tasks and do so with pain not > 2-3/10.   Time 4   Period Weeks   Status Achieved     PT LONG TERM GOAL #4   Title Decrease edema to within 2 cms of non-affected side to assist with pain reduction and range of motion gains.   Time 4   Status Not Met     PT LONG TERM GOAL  #5   Title Perform a reciprocating stair gait with one railing with pain not > 2-3/10.   Time 4   Period Weeks   Status Achieved     PT LONG TERM GOAL #6   Title Increase left knee strength to a solid 4+/5 to provide good stability for accomplishment of functional activities   Period Weeks   Status Achieved               Plan - 06/24/16 1205    Clinical Impression Statement Patient met all but two goals due to lack of active ext in right knee and ongoig edema. Patient independent with HEP's and pleased with overall progress.    Rehab Potential Good   PT Frequency 3x / week   PT Duration 4 weeks   PT Treatment/Interventions ADLs/Self Care Home Management;Cryotherapy;Electrical Stimulation;Gait training;Stair training;Functional mobility training;Therapeutic activities;Therapeutic exercise;Vasopneumatic Device;Manual techniques;Passive range of motion   PT Next Visit Plan DC   Consulted and Agree with Plan of Care Patient      Patient will benefit from skilled therapeutic intervention in order to improve the following deficits and impairments:  Abnormal gait, Pain, Increased edema, Decreased activity tolerance, Decreased range of motion, Decreased strength, Decreased mobility  Visit Diagnosis: Acute pain of left knee  Localized edema  Stiffness of left knee, not elsewhere classified  Muscle weakness (generalized)     Problem List Patient Active Problem List   Diagnosis Date Noted  . OA (osteoarthritis) of knee 03/04/2016  . Acute medial meniscal tear 03/06/2014  . Cold hands and feet 08/23/2012    Cathie Hoops, PTA 06/24/16 12:09 PM  [facility] [address] [place], [place], [address] Phone: [phone]   Fax:  [phone]  Name: Thomas Pham MRN: [id number] Date of Birth: 10/15/1955  PHYSICAL THERAPY DISCHARGE SUMMARY  Visits from Start of Care: 28.  Current functional level related to goals /  functional outcomes: See above.   Remaining deficits: All but goal #2 met.   Education / Equipment: HEP. Plan:  Patient agrees to discharge.  Patient goals were partially met. Patient is being discharged due to being pleased with the current functional level.  ?????         Mali Applegate MPT

## 2017-09-09 ENCOUNTER — Other Ambulatory Visit: Payer: Self-pay | Admitting: Dermatology

## 2017-09-09 DIAGNOSIS — C4491 Basal cell carcinoma of skin, unspecified: Secondary | ICD-10-CM

## 2017-09-09 HISTORY — DX: Basal cell carcinoma of skin, unspecified: C44.91

## 2018-07-18 ENCOUNTER — Other Ambulatory Visit: Payer: Self-pay | Admitting: Dermatology

## 2018-08-01 ENCOUNTER — Other Ambulatory Visit: Payer: Self-pay | Admitting: Physician Assistant

## 2018-08-01 ENCOUNTER — Other Ambulatory Visit: Payer: Self-pay

## 2018-08-01 ENCOUNTER — Ambulatory Visit
Admission: RE | Admit: 2018-08-01 | Discharge: 2018-08-01 | Disposition: A | Discharge status: Home / Self Care | Payer: BC Managed Care – PPO | Source: Ambulatory Visit | Attending: Physician Assistant | Admitting: Physician Assistant

## 2018-08-01 DIAGNOSIS — M546 Pain in thoracic spine: Secondary | ICD-10-CM

## 2018-09-07 ENCOUNTER — Encounter: Payer: Self-pay | Admitting: Neurology

## 2018-09-08 ENCOUNTER — Ambulatory Visit (INDEPENDENT_AMBULATORY_CARE_PROVIDER_SITE_OTHER): Payer: BC Managed Care – PPO | Admitting: Neurology

## 2018-09-08 ENCOUNTER — Encounter: Payer: Self-pay | Admitting: Neurology

## 2018-09-08 ENCOUNTER — Other Ambulatory Visit: Payer: Self-pay

## 2018-09-08 VITALS — BP 122/86 | HR 86 | Temp 98.0°F | Ht 68.0 in | Wt 239.0 lb

## 2018-09-08 DIAGNOSIS — J342 Deviated nasal septum: Secondary | ICD-10-CM | POA: Diagnosis not present

## 2018-09-08 DIAGNOSIS — G4733 Obstructive sleep apnea (adult) (pediatric): Secondary | ICD-10-CM | POA: Diagnosis not present

## 2018-09-08 DIAGNOSIS — E6609 Other obesity due to excess calories: Secondary | ICD-10-CM | POA: Diagnosis not present

## 2018-09-08 DIAGNOSIS — R718 Other abnormality of red blood cells: Secondary | ICD-10-CM | POA: Diagnosis not present

## 2018-09-08 DIAGNOSIS — Z6835 Body mass index (BMI) 35.0-35.9, adult: Secondary | ICD-10-CM

## 2018-09-08 NOTE — Progress Notes (Signed)
SLEEP MEDICINE CLINIC    Provider:  Larey Seat, MD  Primary Care Physician:  Heywood Bene, PA-C [address] [place]  [address]     Referring Provider: Dr Wilburn Cornelia, ENT - MD        Chief Complaint according to patient   Patient presents with:    . New Patient (Initial Visit)           HISTORY OF PRESENT ILLNESS:  Thomas Pham is a 63 y.o. year old White or Caucasian male patient seen here as a referral on 09/08/2018 from Dr Wilburn Cornelia, MD  Chief concern according to patient : " I never could sleep well with CPAP and I have a deviated septum" .   I have the pleasure of seeing Thomas Pham today, a right -handed White or Caucasian male with diagnosed OSA- sleep disorder.  He has a  has a past medical history of Allergy, DDD, Chronic kidney disease due to HTN, Insomnia  GERD (gastroesophageal reflux disease),  gout,  hiatal hernia, kidney stones, Hyperlipidemia, Hypertension, Hypothyroidism, Low back pain, Medial meniscus tear, Organic impotence, Penile cyst, Sleep apnea, and Thyroid disease..     The patient had the first sleep study in the year 2016 at [facility]  with a result of an AHI ( Apnea Hypopnea index)  of 25.3/h , a RDI ( Respiratory Disturbance Index) of 33/h, an oxygen saturation Nadir at Sp02 81%. There was thunderous snoring.     Family medical /sleep history: No other family member on CPAP with OSA, insomnia, sleep walking or other parasomnia.    Social history: Patient is working as Sports coach employed Chartered loss adjuster and lives in a household with 2 persons. He lives with his mother. Family status is single , without children,.  The patient currently is out of work due to the pandemic, irregular work hours. . Tobacco use: none .  ETOH use : rarely ,  Caffeine intake in form of Coffee( none ) Soda(too much ) Tea (rare) or energy drinks. Regular exercise in form of spinning   Hobbies  Reading, basketball- travel.     Sleep habits are as  follows: The patient's dinner time is between 5-6.30 PM. The patient goes to bed at 1 AM - takes melatonin- and continues to sleep for 6-7 hours, wakes for 1 bathroom break. The bedroom is cool, quiet and dark.  The preferred sleep position is lateral , with the support of 4 pillows.  Dreams are reportedly frequent/vivid.   7- 8 AM is the usual rise time. The patient wakes up spontaneously at 8-9 AM, if no alarm is set .  He reports not feeling refreshed or restored in AM, with symptoms such as dry mouth, congested nose, rarely  morning headaches , but with a high degree of residual fatigue. He feels as if struggling to breath.  Naps are taken infrequently, lasting from 15 to 45 minutes and are more/  refreshing than nocturnal sleep.    Review of Systems: Out of a complete 14 system review, the patient complains of only the following symptoms, and all other reviewed systems are negative.:  Fatigue, sleepiness ,loud  snoring, fragmented sleep, Insomnia before Melatonin  and while on CPAP.    How likely are you to doze in the following situations: 0 = not likely, 1 = slight chance, 2 = moderate chance, 3 = high chance   Sitting and Reading? Watching Television? Sitting inactive in a public place (  theater or meeting)? As a passenger in a car for an hour without a break? Lying down in the afternoon when circumstances permit? Sitting and talking to someone? Sitting quietly after lunch without alcohol? In a car, while stopped for a few minutes in traffic?   Total = 8/ 24 points   FSS endorsed at 49/ 63 points.   Social History   Socioeconomic History  . Marital status: Single    Spouse name: Not on file  . Number of children: Not on file  . Years of education: Not on file  . Highest education level: Not on file  Occupational History  . Not on file  Social Needs  . Financial resource strain: Not on file  . Food insecurity    Worry: Not on file    Inability: Not on file  .  Transportation needs    Medical: Not on file    Non-medical: Not on file  Tobacco Use  . Smoking status: Never Smoker  . Smokeless tobacco: Never Used  Substance and Sexual Activity  . Alcohol use: Yes    Comment: socially  . Drug use: No  . Sexual activity: Not on file  Lifestyle  . Physical activity    Days per week: Not on file    Minutes per session: Not on file  . Stress: Not on file  Relationships  . Social Herbalist on phone: Not on file    Gets together: Not on file    Attends religious service: Not on file    Active member of club or organization: Not on file    Attends meetings of clubs or organizations: Not on file    Relationship status: Not on file  Other Topics Concern  . Not on file  Social History Narrative  . Not on file    Family History  Problem Relation Age of Onset  . Hypertension Mother   . Diabetes Mother        prediabetes  . Diabetes Father   . Heart disease Father   . Arthritis Father   . Other Father        alzheimer  . Hyperlipidemia Father   . Hypertension Father   . Stroke Maternal Grandmother   . Other Paternal Grandmother   . Diabetes Paternal Grandmother   . Heart disease Paternal Grandfather   . Other Paternal Grandfather        migraine headache    Past Medical History:  Diagnosis Date  . Allergy   . Arthritis    osteo  . Chronic kidney disease   . Difficulty sleeping   . GERD (gastroesophageal reflux disease)    takes nexium for acid reflux as needed  . History of gout   . History of hiatal hernia    umbilical hernia  . History of kidney stones   . Hyperlipidemia   . Hypertension   . Hypothyroidism   . Low back pain   . Medial meniscus tear    LEFT  . Organic impotence   . Penile cyst   . Sleep apnea    not using mask d/t preference  . Thyroid disease     Past Surgical History:  Procedure Laterality Date  . KNEE ARTHROSCOPY Left 03/07/2014   Procedure: LEFT ARTHROSCOPY KNEE WITH MEDIAL  MENISCECTOMY  DEBRIDEMENT AND CHONDROPLASTY ;  Surgeon: Gearlean Alf, MD;  Location: [facility];  Service: Orthopedics;  Laterality: Left;  . THYROIDECTOMY Right 2013  . TONSILLECTOMY  childhood  . TOTAL KNEE ARTHROPLASTY Left 03/04/2016   Procedure: LEFT TOTAL KNEE ARTHROPLASTY;  Surgeon: Gaynelle Arabian, MD;  Location: [facility];  Service: Orthopedics;  Laterality: Left;     Current Outpatient Medications on File Prior to Visit  Medication Sig Dispense Refill  . aspirin EC 81 MG tablet Take as directed.    . calcium carbonate (OS-CAL) 600 MG tablet Take 600 mg by mouth daily.    . celecoxib (CELEBREX) 200 MG capsule     . cetirizine (ZYRTEC) 10 MG tablet Take 10 mg by mouth at bedtime. For extreme itching    . colchicine 0.6 MG tablet Take 0.6 mg by mouth every morning.     Marland Kitchen esomeprazole (NEXIUM) 40 MG capsule Take 40 mg by mouth daily at 12 noon.    . famotidine (PEPCID) 40 MG tablet Take 40 mg by mouth 2 (two) times daily.    . febuxostat (ULORIC) 40 MG tablet Take 40 mg by mouth daily.    . fexofenadine (ALLEGRA) 180 MG tablet Take by mouth.    . flunisolide (NASAREL) 29 MCG/ACT (0.025%) nasal spray Place 2 sprays into the nose as needed for rhinitis. Dose is for each nostril.    . furosemide (LASIX) 20 MG tablet     . glimepiride (AMARYL) 2 MG tablet     . Glucosamine-Chondroitin (GLUCOSAMINE CHONDR COMPLEX PO) Take 1 tablet by mouth daily.    Marland Kitchen HYDROcodone-acetaminophen (NORCO/VICODIN) 5-325 MG tablet Take by mouth.    . levothyroxine (SYNTHROID, LEVOTHROID) 75 MCG tablet Take 75 mcg by mouth daily before breakfast.    . Methylnaltrexone Bromide (RELISTOR) 150 MG TABS Take 150 mg by mouth daily.    . montelukast (SINGULAIR) 10 MG tablet Take 10 mg by mouth every morning.     . olmesartan-hydrochlorothiazide (BENICAR HCT) 20-12.5 MG tablet Take 1 tablet by mouth daily.    Marland Kitchen oxyCODONE (OXY IR/ROXICODONE) 5 MG immediate release tablet Take 1-2 tablets (5-10 mg total) by mouth every 4  (four) hours as needed for breakthrough pain. 84 tablet 0  . rivaroxaban (XARELTO) 10 MG TABS tablet Take 1 tablet (10 mg total) by mouth daily with breakfast. 20 tablet 0  . sildenafil (VIAGRA) 100 MG tablet Take 100 mg by mouth daily as needed for erectile dysfunction.    . simvastatin (ZOCOR) 40 MG tablet Take 40 mg by mouth daily at 6 PM.     . Vitamin D, Ergocalciferol, (DRISDOL) 1.25 MG (50000 UT) CAPS capsule TAKE 1 CAPSULE EVERY 2 WEEKS AS DIRECTED     No current facility-administered medications on file prior to visit.     No Known Allergies  Physical exam:  Today's Vitals   09/08/18 0938  BP: 122/86  Pulse: 86  Temp: 98 F (36.7 C)  Weight: 239 lb (108.4 kg)  Height: 5\' 8"  (1.727 m)   Body mass index is 36.34 kg/m.   Wt Readings from Last 3 Encounters:  09/08/18 239 lb (108.4 kg)  03/04/16 239 lb (108.4 kg)  02/28/16 239 lb (108.4 kg)     Ht Readings from Last 3 Encounters:  09/08/18 5\' 8"  (1.727 m)  03/04/16 5\' 8"  (1.727 m)  02/28/16 5\' 8"  (1.727 m)      General: The patient is awake, alert and appears not in acute distress. The patient is well groomed. Head: Normocephalic, atraumatic. Neck is supple. Mallampati 5,  neck circumference:19 inches .  Nasal airflow barely patent. Has clearly septal deviation - Retrognathia is  seen.  Dental status: intact Cardiovascular:  Regular rate and cardiac rhythm by pulse,  without distended neck veins. Respiratory: Lungs are clear to auscultation.  Skin:  Without evidence of ankle edema, or rash. Trunk: The patient's posture is erect.   Neurologic exam : The patient is awake and alert, oriented to place and time.   Memory subjective described as intact.  Attention span & concentration ability appears normal.  Speech is fluent,  with dysphonia .  Mood and affect are appropriate.   Cranial nerves: no loss of smell or taste reported  Pupils are equal and briskly reactive to light. Funduscopic exam deferred.  Extraocular movements in vertical and horizontal planes were intact and without nystagmus. No Diplopia. Visual fields by finger perimetry are intact. Hearing was intact to soft voice and finger rubbing.    Facial sensation intact to fine touch.  Facial motor strength is symmetric and tongue and uvula move midline.  Neck ROM : rotation, tilt and flexion extension were normal for age and shoulder shrug was symmetrical.    Motor exam:  Symmetric bulk, tone and ROM.   Normal tone without cog wheeling, symmetric grip strength   Sensory:  Fine touch, pinprick and vibration were tested  and  normal.  Proprioception tested in the upper extremities was normal. Coordination: Rapid alternating movements in the fingers/hands were of normal speed.  The Finger-to-nose maneuver was intact without evidence of ataxia, dysmetria or tremor.   Gait and station: Patient could rise unassisted from a seated position, walked without assistive device. Toe and heel walk were deferred.  Deep tendon reflexes: in the upper and lower extremities are symmetric and intact.  Babinski response was deferred       After spending a total time of  40  minutes face to face and additional time for physical and neurologic examination, review of laboratory studies,  personal review of imaging studies, reports and results of other testing and review of referral information / records as far as provided in visit, I have established the following assessments:  1)  We will retest the patient's baseline degree and type of apnea, REM dependency and oxygen saturation in an attended sleep study. Goal is  To now this baseline and not to start CPAP.  The patient will be undergoing nasal septal surgery before he has plans for the INSPIRE procedure, if he should qualify.    I would like to thank Lonell Grandchild, ENT [facility]  In short, Thomas Pham is presenting with many risk factors and anatomical markers of being high risk for OSA  . I plan to  follow up either personally or through our NP within 1-3 month.     Electronically signed by: Larey Seat, MD 09/08/2018 9:43 AM  [facility] Board certified by The [employer] of Sleep Medicine and Diplomate of the [employer] of Sleep Medicine. Board certified In Neurology through the [place], Fellow of the [employer] of Neurology. Medical Director of [facility].

## 2018-09-13 ENCOUNTER — Telehealth: Payer: Self-pay

## 2018-09-13 ENCOUNTER — Other Ambulatory Visit: Payer: Self-pay | Admitting: Neurology

## 2018-09-13 DIAGNOSIS — G4733 Obstructive sleep apnea (adult) (pediatric): Secondary | ICD-10-CM

## 2018-09-13 NOTE — Telephone Encounter (Signed)
BCBS denied in lab sleep study. Patient does not meet the criteria for study. Need HST order.

## 2018-09-13 NOTE — Telephone Encounter (Signed)
Order placed for the patient.

## 2018-10-10 ENCOUNTER — Ambulatory Visit (INDEPENDENT_AMBULATORY_CARE_PROVIDER_SITE_OTHER): Payer: BC Managed Care – PPO | Admitting: Neurology

## 2018-10-10 DIAGNOSIS — G4733 Obstructive sleep apnea (adult) (pediatric): Secondary | ICD-10-CM | POA: Diagnosis not present

## 2018-10-12 ENCOUNTER — Telehealth: Payer: Self-pay | Admitting: Neurology

## 2018-10-12 DIAGNOSIS — G4733 Obstructive sleep apnea (adult) (pediatric): Secondary | ICD-10-CM | POA: Insufficient documentation

## 2018-10-12 NOTE — Telephone Encounter (Signed)
I spoke to [name] who awaited his results urgently.    I explained that I had needed to correct his medical history and that the newly edited version of his HST is the correct one. He indicated clearly that he is not interested in PAP therapy after hearing that his OSA was severe.    Patient Information     First Name: Thomas Last Name: Pham ID: [id number]  Birth Date: 23-May-1955 Age: 63 Gender: MALE  Referring Provider: Jani Gravel, MD BMI: 36.4 (W=240 lb, H=5' 8'')  Neck Circ.:  19 '' Epworth:  8/24   Sleep Study Information    Study Date:  Oct 11, 2018 S/H/A Version: 001.001.001.001 / 4.1.1528 / 70  History:     Mr. [name] is a 63 year old Caucasian male patient with sleep disordered breathing and has a knee surgery coming up. He needs to be evaluated for OSA and best treatment initiated. He has failed CPAP in the past and is not interested in trying it again, but wanted to consider nasal septum surgery to improve airflow and tolerance and also learn about Inspire procedure.       Summary & Diagnosis:    Severe Obstructive Sleep Apnea at an AHI of 41.4/h and further accentuated in REM sleep to REM AHI of 51/h.  This is associated with intermittent bradycardia, brief hypoxic events and loud snoring.    Recommendations:      This type and degree of apnea he likely will need to be treated with PAP- Positive Airway Pressure.  The patient had tried a CPAP in the past (4 years ago) - but couldn't tolerate it. He is interested in the [place] device.  Given the patients reluctance I spoke to him by phone and he will first undergo knee surgery, then consider nasal septal repair and/ or Inspire procedure.  Physician Name: Larey Seat, MD    10-12-2018

## 2018-10-12 NOTE — Telephone Encounter (Signed)
Patient just had HST on 10-10-2018, I am reading now. CD

## 2018-10-12 NOTE — Telephone Encounter (Signed)
Pt is having surgery soon and the Day Surgery is calling wanting the results of the pt's home sleep study prior to pt's surgery. Wanted to make you aware of this information.

## 2018-10-12 NOTE — Procedures (Deleted)
  Patient Information     First Name: Thomas Last Name: Javarus Kitchens: Pham  Birth Date: 08/24/1955 Age: 63 Gender: Male  Referring Provider: Jani Gravel, MD BMI: 36.4 (W=240 lb, H=5' 8'')  Neck Circ.:  19 '' Epworth:  8/24   Sleep Study Information    Study Date:  Oct 11, 2018 S/H/A Version: 001.001.001.001 / 4.1.1528 / 42  History:    Ms. Thomas Pham is a 63 year old woman with an underlying medical history of hypertension, reflux disease and obesity, who reports snoring and excessive daytime somnolence.  He is to be evaluated for OSA before upcoming surgery.        Summary & Diagnosis:    Severe Obstructive Sleep Apnea at an AHI of 41.4/h and further accentuated in REM sleep to REM AHI of 51/h.  This is associated with intermittent bradycardia, brief hypoxic events and loud snoring.    Recommendations:      This type and degree of apnea will need to be treated with PAP- Positive Airway Pressure. I will prescribe CPAP autotitration device with a pressure window of 7-17 cm water, 3 cm EPTR, heated humidity and mask of choice.  Physician Name: Larey Seat, MD    10-12-2018             Sleep Summary  Oxygen Saturation Statistics   Start Study Time: End Study Time: Total Recording Time:  11:44:13 PM  9:13:37 AM  9 h, 29 min  Total Sleep Time % REM of Sleep Time:  8 h, 13 min  27.7    Mean: 92 Minimum: 75 Maximum: 99  Mean of Desaturations Nadirs (%):   89  Oxygen Desaturation. %:   4-9 10-20 >20 Total  Events Number Total   155  20 88.1 11.4  1 0.6  176 100.0  Oxygen Saturation: <90 <=88 <85 <80 <70  Duration (minutes): Sleep % 21.2 4.3 14.6 4.1 3.0 0.8 0.8 0.2 0.0 0.0     Respiratory Indices      Total Events REM NREM All Night  pRDI:  375  pAHI:  338 ODI:  176  pAHIc:  27  % CSR: 0.0 53.2 50.1 34.1 4.4 43.1 38.0 16.7 2.9 45.9 41.4 21.5 3.3       Pulse Rate Statistics during Sleep (BPM)      Mean:  64 Minimum: 41 Maximum: 93     Indices are calculated using technically valid sleep time of  8 hrs, 10 min. pRDI/pAHI are calculated using oxi desaturations ? 3%  Body Position Statistics  Position Supine Prone Right Left Non-Supine  Sleep (min) 269.0 27.5 47.5 149.0 224.0  Sleep % 54.6 5.6 9.6 30.2 45.4  pRDI 45.7 67.9 45.9 42.1 46.1  pAHI 42.2 65.7 43.4 34.8 40.4  ODI 24.0 15.3 20.4 18.6 18.6     Snoring Statistics Snoring Level (dB) >40 >50 >60 >70 >80 >Threshold (45)  Sleep (min) 135.5 8.0 1.7 0.0 0.0 21.7  Sleep % 27.5 1.6 0.3 0.0 0.0 4.4    Mean: 41 dB Sleep Stages Chart

## 2018-10-13 NOTE — Telephone Encounter (Signed)
Per Dr. Brett Fairy, she discussed these results with pt by phone.

## 2018-10-14 NOTE — Progress Notes (Signed)
Safety portal number is [id number]  Procedure note  SYD

## 2018-11-24 NOTE — Telephone Encounter (Signed)
Pt is requesting sleep study results to be mailed out

## 2019-05-04 ENCOUNTER — Ambulatory Visit: Payer: BC Managed Care – PPO | Admitting: Physician Assistant

## 2019-05-10 ENCOUNTER — Ambulatory Visit (INDEPENDENT_AMBULATORY_CARE_PROVIDER_SITE_OTHER): Payer: BC Managed Care – PPO | Admitting: Physician Assistant

## 2019-05-10 ENCOUNTER — Encounter: Payer: Self-pay | Admitting: Physician Assistant

## 2019-05-10 ENCOUNTER — Other Ambulatory Visit: Payer: Self-pay

## 2019-05-10 DIAGNOSIS — L570 Actinic keratosis: Secondary | ICD-10-CM

## 2019-05-10 DIAGNOSIS — L820 Inflamed seborrheic keratosis: Secondary | ICD-10-CM

## 2019-05-10 NOTE — Progress Notes (Addendum)
   Follow up Visit  Subjective  Thomas Pham is a 64 y.o. male who presents for the following: Follow-up (Patient has a list of LN2 to discuss) and Skin Problem (Patient wants advice on otc skin care products). He has precancers on his arms and is not sure if the wart on his thumb is better. He continues to put [facility] on it on occasion. He is still picking at the areas on his arms. No areas are sore or bleed.   Objective  Well appearing patient in no apparent distress; mood and affect are within normal limits.  All skin waist up examined. No suspicious moles noted on back.   Objective  Left Elbow - Posterior, Left Forearm - Anterior, Left Forearm - Posterior (5), Left Upper Back, Right Elbow - Posterior, Right Forearm - Anterior, Right Forearm - Posterior (2), Right Upper Back: Erythematous patches with gritty scale.  Objective  Left Upper Back (5), Right Upper Back (9): Erythematous stuck-on, waxy papule or plaque. Erythematous stuck-on, waxy papule or plaque.   Assessment & Plan  AK (actinic keratosis) (13) Left Elbow - Posterior; Right Elbow - Posterior; Left Forearm - Anterior; Right Forearm - Anterior; Left Forearm - Posterior (5); Right Forearm - Posterior (2); Left Upper Back; Right Upper Back  Destruction of lesion - Left Elbow - Posterior, Left Forearm - Anterior, Left Forearm - Posterior (2), Left Upper Back, Right Elbow - Posterior, Right Forearm - Anterior, Right Forearm - Posterior (2), Right Upper Back  Inflamed seborrheic keratosis (14) Left Upper Back (5); Right Upper Back (9)  Destruction of lesion - Left Upper Back, Right Upper Back  We discussed him picking his arms. I also went over multiple skin care products and discussed filler under the eyes.

## 2019-06-21 ENCOUNTER — Encounter: Payer: Self-pay | Admitting: *Deleted

## 2019-06-22 ENCOUNTER — Ambulatory Visit (INDEPENDENT_AMBULATORY_CARE_PROVIDER_SITE_OTHER): Payer: BC Managed Care – PPO | Admitting: Physician Assistant

## 2019-06-22 ENCOUNTER — Encounter: Payer: Self-pay | Admitting: Physician Assistant

## 2019-06-22 ENCOUNTER — Other Ambulatory Visit: Payer: Self-pay

## 2019-06-22 DIAGNOSIS — L820 Inflamed seborrheic keratosis: Secondary | ICD-10-CM

## 2019-06-22 DIAGNOSIS — L570 Actinic keratosis: Secondary | ICD-10-CM

## 2019-06-22 DIAGNOSIS — B078 Other viral warts: Secondary | ICD-10-CM

## 2019-06-22 DIAGNOSIS — D485 Neoplasm of uncertain behavior of skin: Secondary | ICD-10-CM | POA: Diagnosis not present

## 2019-06-22 NOTE — Progress Notes (Signed)
   Follow up Visit  Subjective  YUTO DIRIENZO is a 64 y.o. male who presents for the following: Skin Problem (Ln2 forearm and back). Crack over 3rd MCP joint that has lasted for months. Won't heal and go away. Wart on thumb may be gone but just may be callus. Still has scaling areas on arms.   Objective  Well appearing patient in no apparent distress; mood and affect are within normal limits.  All skin waist up examined. No suspicious moles noted on back.   Objective  Left Forearm - Posterior (3), Left Scaphoid Fossa, Right Forearm - Posterior (2): Erythematous patches with gritty scale.  Objective  Right Lower Back (2): Erythematous stuck-on, waxy papule or plaque.   Objective  Right 3rd Finger Metacarpophalangeal Joint: Warty papule  4.0 cm to brown spot       Objective  Left Thumb Tip: Wart appears clear   Assessment & Plan  AK (actinic keratosis) (6) Left Forearm - Posterior (3); Right Forearm - Posterior (2); Left Scaphoid Fossa  Destruction of lesion - Left Forearm - Posterior, Left Scaphoid Fossa, Right Forearm - Posterior Complexity: simple   Destruction method: cryotherapy   Informed consent: discussed and consent obtained   Timeout:  patient name, date of birth, surgical site, and procedure verified Lesion destroyed using liquid nitrogen: Yes   Outcome: patient tolerated procedure well with no complications    Inflamed seborrheic keratosis (2) Right Lower Back  Destruction of lesion - Right Lower Back Complexity: simple   Destruction method: cryotherapy   Informed consent: discussed and consent obtained   Timeout:  patient name, date of birth, surgical site, and procedure verified Lesion destroyed using liquid nitrogen: Yes   Outcome: patient tolerated procedure well with no complications    Neoplasm of uncertain behavior of skin Right 3rd Finger Metacarpophalangeal Joint  Skin / nail biopsy Type of biopsy: tangential   Procedure prep:   Patient was prepped and draped in usual sterile fashion (Non sterile) Prep type:  Chlorhexidine Anesthesia: the lesion was anesthetized in a standard fashion   Anesthetic:  1% lidocaine w/ epinephrine 1-100,000 local infiltration Instrument used: flexible razor blade    Specimen 1 - Surgical pathology Differential Diagnosis: R/O Wart vs SCC Check Margins: No Cautery after biopsy  Other viral warts Left Thumb Tip No Treatment

## 2019-06-22 NOTE — Patient Instructions (Signed)

## 2019-06-26 ENCOUNTER — Telehealth: Payer: Self-pay | Admitting: *Deleted

## 2019-06-26 NOTE — Addendum Note (Signed)
Addended by: Arlyss Gandy R on: 06/26/2019 10:12 AM   Modules accepted: Level of Service

## 2019-06-26 NOTE — Telephone Encounter (Signed)
-----   Message from Arlyss Gandy, [place] sent at 06/26/2019  8:53 AM EDT ----- Benign thickening. Watch for recurrence

## 2019-06-26 NOTE — Telephone Encounter (Signed)
Left message for patient to call back  

## 2019-06-27 ENCOUNTER — Telehealth: Payer: Self-pay

## 2019-06-27 NOTE — Telephone Encounter (Signed)
Pathology given to patient no follow up needed.

## 2019-08-09 ENCOUNTER — Ambulatory Visit: Payer: BC Managed Care – PPO | Admitting: Physician Assistant

## 2019-09-14 ENCOUNTER — Encounter: Payer: Self-pay | Admitting: Physician Assistant

## 2019-09-14 ENCOUNTER — Ambulatory Visit (INDEPENDENT_AMBULATORY_CARE_PROVIDER_SITE_OTHER): Payer: BC Managed Care – PPO | Admitting: Physician Assistant

## 2019-09-14 ENCOUNTER — Other Ambulatory Visit: Payer: Self-pay

## 2019-09-14 DIAGNOSIS — Z85828 Personal history of other malignant neoplasm of skin: Secondary | ICD-10-CM

## 2019-09-14 DIAGNOSIS — D485 Neoplasm of uncertain behavior of skin: Secondary | ICD-10-CM | POA: Diagnosis not present

## 2019-09-14 DIAGNOSIS — L570 Actinic keratosis: Secondary | ICD-10-CM | POA: Diagnosis not present

## 2019-09-14 DIAGNOSIS — L820 Inflamed seborrheic keratosis: Secondary | ICD-10-CM | POA: Diagnosis not present

## 2019-09-14 DIAGNOSIS — C4491 Basal cell carcinoma of skin, unspecified: Secondary | ICD-10-CM

## 2019-09-14 HISTORY — DX: Basal cell carcinoma of skin, unspecified: C44.91

## 2019-09-14 NOTE — Patient Instructions (Signed)

## 2019-09-14 NOTE — Progress Notes (Addendum)
   Follow up Visit  Subjective  Thomas Pham is a 64 y.o. male who presents for the following: Follow-up (Here to freeze more spots. Concerns both arms, scalp and back. ).  Objective  Well appearing patient in no apparent distress; mood and affect are within normal limits.  All skin waist up examined. No suspicious moles noted on back.   Objective  Left Upper Back (4), Right Upper Back (4), Scalp (2): Erythematous stuck-on, waxy papule or plaque.   Objective  Right Malar Cheek: Umbilicated papule     Objective  Left Forearm - Posterior (3), Right Forearm - Posterior (4): Erythematous patches with gritty scale.  Objective  Left Malar Cheek: Treated in office and returned so needed [facility]  Assessment & Plan  Inflamed seborrheic keratosis (10) Left Upper Back (4); Right Upper Back (4); Scalp (2)  Destruction of lesion - Left Upper Back, Right Upper Back, Scalp Complexity: simple   Destruction method: cryotherapy   Informed consent: discussed and consent obtained   Timeout:  patient name, date of birth, surgical site, and procedure verified Lesion destroyed using liquid nitrogen: Yes   Outcome: patient tolerated procedure well with no complications    Neoplasm of uncertain behavior of skin Right Malar Cheek  Skin / nail biopsy Type of biopsy: tangential   Informed consent: discussed and consent obtained   Timeout: patient name, date of birth, surgical site, and procedure verified   Procedure prep:  Patient was prepped and draped in usual sterile fashion (Non sterile) Prep type:  Chlorhexidine Anesthesia: the lesion was anesthetized in a standard fashion   Anesthetic:  1% lidocaine w/ epinephrine 1-100,000 local infiltration Instrument used: flexible razor blade   Outcome: patient tolerated procedure well   Post-procedure details: wound care instructions given    Specimen 1 - Surgical pathology Differential Diagnosis: SCC VS BCC Check Margins: No  AK (actinic  keratosis) (7) Left Forearm - Posterior (3); Right Forearm - Posterior (4)  Destruction of lesion - Left Forearm - Posterior, Right Forearm - Posterior Complexity: simple   Destruction method: cryotherapy   Informed consent: discussed and consent obtained   Timeout:  patient name, date of birth, surgical site, and procedure verified Lesion destroyed using liquid nitrogen: Yes   Outcome: patient tolerated procedure well with no complications    History of basal cell carcinoma (BCC) Left Malar Cheek

## 2019-09-19 ENCOUNTER — Encounter: Payer: Self-pay | Admitting: *Deleted

## 2019-09-19 ENCOUNTER — Telehealth: Payer: Self-pay | Admitting: *Deleted

## 2019-09-19 NOTE — Telephone Encounter (Signed)
Pathology to patient: [facility]

## 2019-09-19 NOTE — Telephone Encounter (Signed)
-----   Message from Arlyss Gandy, [place] sent at 09/18/2019  6:44 PM EDT ----- mohs

## 2019-11-28 ENCOUNTER — Ambulatory Visit: Payer: BC Managed Care – PPO | Admitting: Dermatology

## 2019-12-19 ENCOUNTER — Encounter: Payer: Self-pay | Admitting: Dermatology

## 2019-12-19 ENCOUNTER — Other Ambulatory Visit: Payer: Self-pay

## 2019-12-19 ENCOUNTER — Ambulatory Visit (INDEPENDENT_AMBULATORY_CARE_PROVIDER_SITE_OTHER): Payer: BC Managed Care – PPO | Admitting: Dermatology

## 2019-12-19 DIAGNOSIS — L570 Actinic keratosis: Secondary | ICD-10-CM | POA: Diagnosis not present

## 2019-12-19 DIAGNOSIS — Z85828 Personal history of other malignant neoplasm of skin: Secondary | ICD-10-CM | POA: Diagnosis not present

## 2019-12-19 DIAGNOSIS — Z1283 Encounter for screening for malignant neoplasm of skin: Secondary | ICD-10-CM

## 2019-12-26 ENCOUNTER — Encounter: Payer: Self-pay | Admitting: Dermatology

## 2019-12-26 NOTE — Progress Notes (Signed)
   Follow-Up Visit   Subjective  Thomas Pham is a 64 y.o. male who presents for the following: No chief complaint on file..  Check Mohs surgery site cheek plus new crusts Location:  Duration:  Quality:  Associated Signs/Symptoms: Modifying Factors:  Severity:  Timing: Context:   Objective  Well appearing patient in no apparent distress; mood and affect are within normal limits.  All skin waist up examined.   Assessment & Plan    Screening exam for skin cancer Chest - Medial (Center)  Yearly skin check  AK (actinic keratosis) (5) Right Upper Arm - Anterior; Right Forearm - Anterior (2); Left Forearm - Posterior; Left Flank  Destruction of lesion - Left Flank, Left Forearm - Posterior, Right Forearm - Anterior (2), Right Upper Arm - Anterior Complexity: simple   Destruction method: cryotherapy   Informed consent: discussed and consent obtained   Timeout:  patient name, date of birth, surgical site, and procedure verified Lesion destroyed using liquid nitrogen: Yes   Cryotherapy cycles:  5 Outcome: patient tolerated procedure well with no complications       I, Lavonna Monarch, MD, have reviewed all documentation for this visit.  The documentation on 12/26/19 for the exam, diagnosis, procedures, and orders are all accurate and complete.

## 2020-03-25 ENCOUNTER — Ambulatory Visit: Payer: BC Managed Care – PPO | Admitting: Dermatology

## 2020-06-05 ENCOUNTER — Ambulatory Visit: Payer: BC Managed Care – PPO | Admitting: Dermatology

## 2020-06-27 ENCOUNTER — Ambulatory Visit: Payer: BC Managed Care – PPO | Admitting: Physical Therapy

## 2020-07-03 ENCOUNTER — Ambulatory Visit (INDEPENDENT_AMBULATORY_CARE_PROVIDER_SITE_OTHER): Payer: BC Managed Care – PPO | Admitting: Dermatology

## 2020-07-03 ENCOUNTER — Other Ambulatory Visit: Payer: Self-pay

## 2020-07-03 ENCOUNTER — Encounter: Payer: Self-pay | Admitting: Dermatology

## 2020-07-03 DIAGNOSIS — Z1283 Encounter for screening for malignant neoplasm of skin: Secondary | ICD-10-CM

## 2020-07-03 DIAGNOSIS — L570 Actinic keratosis: Secondary | ICD-10-CM | POA: Diagnosis not present

## 2020-07-03 DIAGNOSIS — L281 Prurigo nodularis: Secondary | ICD-10-CM | POA: Diagnosis not present

## 2020-07-03 NOTE — Patient Instructions (Signed)
wegovy - research oral medication

## 2020-07-11 ENCOUNTER — Ambulatory Visit: Payer: BC Managed Care – PPO

## 2020-07-17 ENCOUNTER — Encounter: Payer: Self-pay | Admitting: Dermatology

## 2020-07-17 NOTE — Progress Notes (Signed)
   Follow-Up Visit   Subjective  Thomas Pham is a 65 y.o. male who presents for the following: Annual Exam (Scaly spots all over body).  Skin examination, most concern are sun exposed crusts that Thomas Pham readily admits he picks at. Location:  Duration:  Quality:  Associated Signs/Symptoms: Modifying Factors:  Severity:  Timing: Context:   Objective  Well appearing patient in no apparent distress; mood and affect are within normal limits. Objective  Left Forearm - Anterior (10), Right Forearm - Anterior (10): Thomas Pham has roughly a dozen lichenified pink crusts which may be combination of actinic keratoses plus secondary picking versus primary picker's nodules.  Historically these have tended to respond to freezing so we proceeded to do 4 to 5-second freeze on each of these.  Objective  Left Forearm - Posterior, Mid Parietal Scalp (5), Right Forearm - Posterior (2): Thomas Pham has roughly a dozen lichenified pink crusts which may be combination of actinic keratoses plus secondary picking versus primary picker's nodules.  Historically these have tended to respond to freezing so we proceeded to do 4 to 5-second freeze on each of these.    All skin waist up examined.   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); Right Forearm - Anterior (10)  Destruction of lesion - Left Forearm - Anterior, Right Forearm - Anterior Complexity: simple   Destruction method: cryotherapy   Informed consent: discussed and consent obtained   Timeout:  patient name, date of birth, surgical site, and procedure verified Lesion destroyed using liquid nitrogen: Yes   Cryotherapy cycles:  3 Outcome: patient tolerated procedure well with no complications    AK (actinic keratosis) (8) Left Forearm - Posterior; Right Forearm - Posterior (2); Mid Parietal Scalp (5)  Destruction of lesion - Left Forearm - Posterior, Mid Parietal Scalp, Right Forearm -  Posterior Complexity: simple   Destruction method: cryotherapy   Informed consent: discussed and consent obtained   Timeout:  patient name, date of birth, surgical site, and procedure verified Lesion destroyed using liquid nitrogen: Yes   Cryotherapy cycles:  3 Outcome: patient tolerated procedure well with no complications        I, Lavonna Monarch, MD, have reviewed all documentation for this visit.  The documentation on 07/17/20 for the exam, diagnosis, procedures, and orders are all accurate and complete.

## 2020-08-06 ENCOUNTER — Ambulatory Visit: Payer: BC Managed Care – PPO | Admitting: Dermatology

## 2020-08-14 ENCOUNTER — Ambulatory Visit: Payer: BC Managed Care – PPO | Attending: Orthopedic Surgery | Admitting: Physical Therapy

## 2020-08-14 ENCOUNTER — Other Ambulatory Visit: Payer: Self-pay

## 2020-08-14 DIAGNOSIS — M25572 Pain in left ankle and joints of left foot: Secondary | ICD-10-CM | POA: Diagnosis present

## 2020-08-14 NOTE — Therapy (Addendum)
[facility] [place], [place], [address] Phone: [phone]   Fax:  [phone]  Physical Therapy Evaluation  Patient Details  Name: Thomas Pham MRN: [id number] Date of Birth: [birth year] Referring Provider (PT): Wylene Simmer MD   Encounter Date: 08/14/2020   PT End of Session - 08/14/20 0920     Visit Number 1    Number of Visits 1    Date for PT Re-Evaluation 08/14/20    PT Start Time 0807    PT Stop Time 0837    PT Time Calculation (min) 30 min    Activity Tolerance Patient tolerated treatment well    Behavior During Therapy [facility] for tasks assessed/performed             Past Medical History:  Diagnosis Date   Allergy    Arthritis    osteo   Basal cell carcinoma 09/13/2014   BCC NODULAR LEFT CHEEK PER ST [facility]   Basal cell carcinoma of skin 09/14/2019   right malar cheek -MOHS   BCC (basal cell carcinoma of skin) 09/09/2017   BCC MICRONODULAR LEFT CHEEK CX3 AND EXC   BCC (basal cell carcinoma of skin) 09/09/2017   BCC POSITIVE MARGIN MOHS DR PEARCE   Chronic kidney disease    Difficulty sleeping    GERD (gastroesophageal reflux disease)    takes nexium for acid reflux as needed   History of gout    History of hiatal hernia    umbilical hernia   History of kidney stones    Hyperlipidemia    Hypertension    Hypothyroidism    Low back pain    Medial meniscus tear    LEFT   Organic impotence    Penile cyst    Sleep apnea    not using mask d/t preference   Thyroid disease     Past Surgical History:  Procedure Laterality Date   KNEE ARTHROSCOPY Left 03/07/2014   Procedure: LEFT ARTHROSCOPY KNEE WITH MEDIAL MENISCECTOMY  DEBRIDEMENT AND CHONDROPLASTY ;  Surgeon: Gearlean Alf, MD;  Location: [facility];  Service: Orthopedics;  Laterality: Left;   THYROIDECTOMY Right 2013   TONSILLECTOMY     childhood   TOTAL KNEE ARTHROPLASTY Left 03/04/2016   Procedure: LEFT TOTAL KNEE ARTHROPLASTY;  Surgeon:  Gaynelle Arabian, MD;  Location: [facility];  Service: Orthopedics;  Laterality: Left;    There were no vitals filed for this visit.    Subjective Assessment - 08/14/20 0922     Subjective COVID-19 screen performed prior to patient entering clinic.  The patient presents to the clinic today with c/o left ankle pain and he has been diagnosed with Tibialis posterior tendonitis.  This has been ongoing for several months and it does not hurt all the time.  His pain is a low 2/10 today.  He has a figure 8 brace that he uses as well which helps when he is out of doors which is when his ankle hurts the most.    Pertinent History Left TKA, HTN, hypothyroidism, LBP.    How long can you stand comfortably? Varies.    How long can you walk comfortably? Varies.    Patient Stated Goals Do activites without pain.    Currently in Pain? Yes    Pain Score 2     Pain Location Ankle    Pain Orientation Left    Pain Descriptors / Indicators Aching;Shooting    Pain Type Chronic pain    Pain Onset  More than a month ago    Pain Frequency Constant    Aggravating Factors  Working outside.    Pain Relieving Factors Rest.                [facility] PT Assessment - 08/14/20 0001       Assessment   Medical Diagnosis Left posterior tibialis tendonitis.    Referring Provider (PT) Wylene Simmer MD    Onset Date/Surgical Date --   December 2021.     Precautions   Precautions None      Restrictions   Weight Bearing Restrictions No      Balance Screen   Has the patient fallen in the past 6 months No    Has the patient had a decrease in activity level because of a fear of falling?  No    Is the patient reluctant to leave their home because of a fear of falling?  No      Home Ecologist residence      Prior Function   Level of Independence Independent      ROM / Strength   AROM / PROM / Strength AROM;Strength      AROM   Overall AROM Comments Full active left ankle range of  motion.      Strength   Overall Strength Comments Normal left ankle strength.      Palpation   Palpation comment Tender to palpation over Navicular attachment of left post tib.      Ambulation/Gait   Gait Comments Essentially WNL.                        Objective measurements completed on examination: See above findings.               PT Education - 08/14/20 0912     Education Details Patient instructed in short duration ice massage, info on TENS unit.  Also provided wiht info regarding shoe insert.                 PT Long Term Goals - 08/14/20 1102       PT LONG TERM GOAL #1   Title Independent with a HEP.    Time 1    Period Days    Status Achieved                    Plan - 08/14/20 1037     Clinical Impression Statement The patient presents to [facility] with c/o left ankle pain with a diagnosis of left Tibialis Posterior Tendonitis.  His left ankle range of motion and strength is normal.  he is tender to palpation over the navicular attachment of the left post tib.  His pain is tansient in nature and tends to hurt when he is walking outside.  He has a figure 8 ankle brace that is helpful.  He was provided with red theraband for a home exercise.  He was also instructed in short dutation ice massage, information on a TENS unit and a shoe insert.  He only wanted one visit.    Personal Factors and Comorbidities Other;Comorbidity 1;Comorbidity 2    Comorbidities Left TKA, HTN, hypothyroidism, LBP.    Examination-Activity Limitations Locomotion Level    Examination-Participation Restrictions Other    Stability/Clinical Decision Making Stable/Uncomplicated    Rehab Potential Excellent    PT Frequency --   One visit.   PT Treatment/Interventions ADLs/Self Care Home Management;Cryotherapy;Dealer  Stimulation;Therapeutic exercise;Patient/family education    PT Next Visit Plan Patient wanting only one visit to establish a HEP and hoem  treatment.             Patient will benefit from skilled therapeutic intervention in order to improve the following deficits and impairments:  Pain  Visit Diagnosis: Pain in left ankle and joints of left foot - Plan: PT plan of care cert/re-cert     Problem List Patient Active Problem List   Diagnosis Date Noted   Severe obstructive sleep apnea-hypopnea syndrome 10/12/2018   OA (osteoarthritis) of knee 03/04/2016   Acute medial meniscal tear 03/06/2014   Cold hands and feet 08/23/2012    Thomas Pham, [place] MPT 08/14/2020, 11:04 AM  [facility] [address] [place], [place], [address] Phone: [phone]   Fax:  [phone]  Name: Thomas Pham MRN: [id number] Date of Birth: 30-Jun-1955  PHYSICAL THERAPY DISCHARGE SUMMARY  Visits from Start of Care: 1.  Current functional level related to goals / functional outcomes: See above.   Remaining deficits: Patient requesting one visit.   Education / Equipment: HEP.   Patient agrees to discharge. Patient goals were met. Patient is being discharged due to  Patient pleased with current status.    Mali Lenya Sterne MPT

## 2020-09-30 ENCOUNTER — Other Ambulatory Visit: Payer: Self-pay

## 2020-09-30 ENCOUNTER — Encounter: Payer: Self-pay | Admitting: Dermatology

## 2020-09-30 ENCOUNTER — Ambulatory Visit (INDEPENDENT_AMBULATORY_CARE_PROVIDER_SITE_OTHER): Payer: BC Managed Care – PPO | Admitting: Dermatology

## 2020-09-30 DIAGNOSIS — D485 Neoplasm of uncertain behavior of skin: Secondary | ICD-10-CM

## 2020-09-30 DIAGNOSIS — L570 Actinic keratosis: Secondary | ICD-10-CM

## 2020-09-30 DIAGNOSIS — C44519 Basal cell carcinoma of skin of other part of trunk: Secondary | ICD-10-CM

## 2020-09-30 DIAGNOSIS — C4491 Basal cell carcinoma of skin, unspecified: Secondary | ICD-10-CM

## 2020-09-30 DIAGNOSIS — Z1283 Encounter for screening for malignant neoplasm of skin: Secondary | ICD-10-CM

## 2020-09-30 DIAGNOSIS — L281 Prurigo nodularis: Secondary | ICD-10-CM

## 2020-09-30 DIAGNOSIS — L729 Follicular cyst of the skin and subcutaneous tissue, unspecified: Secondary | ICD-10-CM | POA: Diagnosis not present

## 2020-09-30 DIAGNOSIS — L739 Follicular disorder, unspecified: Secondary | ICD-10-CM

## 2020-09-30 HISTORY — DX: Basal cell carcinoma of skin, unspecified: C44.91

## 2020-09-30 NOTE — Patient Instructions (Signed)

## 2020-10-07 ENCOUNTER — Telehealth: Payer: Self-pay | Admitting: *Deleted

## 2020-10-07 NOTE — Telephone Encounter (Signed)
-----   Message from Lavonna Monarch, MD sent at 10/05/2020  5:34 PM EDT ----- Schedule surgery with Dr. Darene Lamer

## 2020-10-07 NOTE — Telephone Encounter (Signed)
Path to patient. Patient already had surgery appointment.

## 2020-10-11 ENCOUNTER — Encounter: Payer: Self-pay | Admitting: Dermatology

## 2020-10-11 NOTE — Progress Notes (Signed)
   Follow-Up Visit   Subjective  Thomas Pham is a 65 y.o. male who presents for the following: Follow-up (Patient here today for LN2 of lesions on both arms. Per patient he has a lesion on his right upper groin that he would like check x months per patient painful to touch, no drainage. ).  Several areas to check the leg, including crust on arms and source of bilateral abdomen Location:  Duration:  Quality:  Associated Signs/Symptoms: Modifying Factors:  Severity:  Timing: Context:   Objective  Well appearing patient in no apparent distress; mood and affect are within normal limits. Right Abdomen (side) - Upper       Mid Back Full body skin exam.No atypical pigmented lesions. Possible carcinoma abdomen will be biopsied.  Left Forearm - Posterior (6) This is an ongoing problem for Mr. Thomas Pham.  He really acknowledges that he does get pick on his skin.  Right Abdomen (side) - Lower Solitary 59m Papule with tiny central pustule.  Nonfluctuant.  Left Forearm - Posterior (5), Right Forearm - Posterior Gritty crusts, most with secondary lichenification from picking .  Difficult to distinguish primary versus secondary picker's nodules.     All skin waist up examined.   Assessment & Plan    Neoplasm of uncertain behavior of skin Right Abdomen (side) - Upper  Skin / nail biopsy Type of biopsy: tangential   Informed consent: discussed and consent obtained   Timeout: patient name, date of birth, surgical site, and procedure verified   Anesthesia: the lesion was anesthetized in a standard fashion   Anesthetic:  1% lidocaine w/ epinephrine 1-100,000 local infiltration Instrument used: flexible razor blade   Hemostasis achieved with: ferric subsulfate   Outcome: patient tolerated procedure well   Post-procedure details: wound care instructions given    Specimen 1 - Surgical pathology Differential Diagnosis: bcc  Check Margins: No  Screening for malignant neoplasm  of skin Mid Back  Yearly skin exam.  Prurigo nodularis (6) Left Forearm - Posterior  When dupilimab is available. Patient can discuss with Dr.Cienna Dumais to see if this would be good treatment for his arms.   Folliculitis Right Abdomen (side) - Lower  Discussed the possible anti-inflammatory effect of the same liquid nitrogen that we used for the lesions on his arms.  Destruction of lesion - Right Abdomen (side) - Lower Complexity: simple   Destruction method: cryotherapy   Informed consent: discussed and consent obtained   Timeout:  patient name, date of birth, surgical site, and procedure verified Lesion destroyed using liquid nitrogen: Yes   Cryotherapy cycles:  3 Outcome: patient tolerated procedure well with no complications   Post-procedure details: wound care instructions given    AK (actinic keratosis) (6) Left Forearm - Posterior (5); Right Forearm - Posterior  Destruction of lesion - Left Forearm - Posterior, Right Forearm - Posterior Complexity: simple   Destruction method: cryotherapy   Informed consent: discussed and consent obtained   Timeout:  patient name, date of birth, surgical site, and procedure verified Lesion destroyed using liquid nitrogen: Yes   Cryotherapy cycles:  3 Outcome: patient tolerated procedure well with no complications   Post-procedure details: wound care instructions given        I, SLavonna Monarch MD, have reviewed all documentation for this visit.  The documentation on 10/11/20 for the exam, diagnosis, procedures, and orders are all accurate and complete.

## 2020-12-25 ENCOUNTER — Ambulatory Visit (INDEPENDENT_AMBULATORY_CARE_PROVIDER_SITE_OTHER): Payer: BC Managed Care – PPO | Admitting: Dermatology

## 2020-12-25 ENCOUNTER — Other Ambulatory Visit: Payer: Self-pay

## 2020-12-25 ENCOUNTER — Encounter: Payer: Self-pay | Admitting: Dermatology

## 2020-12-25 DIAGNOSIS — L281 Prurigo nodularis: Secondary | ICD-10-CM

## 2020-12-25 DIAGNOSIS — L570 Actinic keratosis: Secondary | ICD-10-CM

## 2020-12-26 ENCOUNTER — Encounter: Payer: Self-pay | Admitting: Dermatology

## 2021-01-17 ENCOUNTER — Encounter: Payer: Self-pay | Admitting: Dermatology

## 2021-01-17 NOTE — Progress Notes (Signed)
   Follow-Up Visit   Subjective  Thomas Pham is a 65 y.o. male who presents for the following: Follow-up (Patient stated new lesion on the fore arms and back to ln2 ).  Multiple areas to check, new crust on arms  Location:  Duration:  Quality:  Associated Signs/Symptoms: Modifying Factors:  Severity:  Timing: Context:   Objective  Well appearing patient in no apparent distress; mood and affect are within normal limits. Dozen plus excoriated lichenified papules, particularly upper back and arms.  Head (4), Left Arm (4), Right Arm (4), Torso - Posterior (Back) (6) Multiple pink crusts particularly on arms several are lichenified and likely representing combination of actinic keratoses plus picker's nodules.  1 on the left upper arm could represent either a superficial skin cancer or simply irritation) patient is certainly about its duration)-this will be photographed and rechecked in 6 months.  Recheck lesion in febuary        All skin waist up examined.   Assessment & Plan    AK (actinic keratosis) (18) Torso - Posterior (Back) (6); Head (4); Left Arm (4); Right Arm (4)  Destruction of lesion - Head, Left Arm, Right Arm, Torso - Posterior (Back) Complexity: simple   Destruction method: cryotherapy   Informed consent: discussed and consent obtained   Timeout:  patient name, date of birth, surgical site, and procedure verified Lesion destroyed using liquid nitrogen: Yes   Cryotherapy cycles:  3 Outcome: patient tolerated procedure well with no complications   Post-procedure details: wound care instructions given    Prurigo nodularis  I discussed the possibility that [place] may receive this diagnosis as an indication for its use.      I, Lavonna Monarch, MD, have reviewed all documentation for this visit.  The documentation on 01/17/21 for the exam, diagnosis, procedures, and orders are all accurate and complete.

## 2021-02-12 ENCOUNTER — Other Ambulatory Visit: Payer: Self-pay

## 2021-02-12 ENCOUNTER — Encounter: Payer: Self-pay | Admitting: Podiatry

## 2021-02-12 ENCOUNTER — Ambulatory Visit (INDEPENDENT_AMBULATORY_CARE_PROVIDER_SITE_OTHER): Payer: BC Managed Care – PPO | Admitting: Podiatry

## 2021-02-12 DIAGNOSIS — L600 Ingrowing nail: Secondary | ICD-10-CM | POA: Diagnosis not present

## 2021-02-12 NOTE — Patient Instructions (Signed)

## 2021-02-12 NOTE — Progress Notes (Signed)
Subjective:   Patient ID: Thomas Pham, male   DOB: 65 y.o.   MRN: [id number]   HPI Patient presents with chronic ingrown toenail of the left big toe stating that its been sore he is tried to trim it and soak it without relief and it has been an ongoing problem for years.  Patient does not smoke likes to be active   Review of Systems  All other systems reviewed and are negative.      Objective:  Physical Exam Vitals and nursing note reviewed.  Constitutional:      Appearance: He is well-developed.  Pulmonary:     Effort: Pulmonary effort is normal.  Musculoskeletal:        General: Normal range of motion.  Skin:    General: Skin is warm.  Neurological:     Mental Status: He is alert.    Patient presents with good neurological vascular status with incurvated lateral border left hallux painful when pressed with no active redness no active drainage noted.  Good digital perfusion well oriented x3     Assessment:  Chronic ingrown toenail deformity left hallux lateral border with pain     Plan:  H&P reviewed condition recommended correction of deformity explained procedure risk and patient wants surgery.  I explained what would be done in today he signed consent form after review and I infiltrated the left hallux 60 mg like Marcaine mixture sterile prep and using sterile instrumentation remove the border exposed matrix applied phenol 3 applications 30 seconds followed by alcohol lavage sterile dressing and gave instructions on soaks urged to reappoint immediately if any issues were to occur

## 2021-02-25 ENCOUNTER — Ambulatory Visit: Payer: BC Managed Care – PPO | Admitting: Neurology

## 2021-03-20 ENCOUNTER — Other Ambulatory Visit: Payer: Self-pay

## 2021-03-20 ENCOUNTER — Encounter: Payer: Self-pay | Admitting: Dermatology

## 2021-03-20 ENCOUNTER — Ambulatory Visit (INDEPENDENT_AMBULATORY_CARE_PROVIDER_SITE_OTHER): Payer: BC Managed Care – PPO | Admitting: Dermatology

## 2021-03-20 DIAGNOSIS — C44519 Basal cell carcinoma of skin of other part of trunk: Secondary | ICD-10-CM | POA: Diagnosis not present

## 2021-03-20 DIAGNOSIS — L570 Actinic keratosis: Secondary | ICD-10-CM

## 2021-03-20 NOTE — Patient Instructions (Signed)

## 2021-04-11 ENCOUNTER — Encounter: Payer: Self-pay | Admitting: Dermatology

## 2021-04-11 NOTE — Progress Notes (Signed)
° °  Follow-Up Visit   Subjective  Thomas Pham is a 66 y.o. male who presents for the following: Procedure (Right upper abdomen- bcc  x 1).  BCC never treated on abdomen, multiple crusts on arms Location:  Duration:  Quality:  Associated Signs/Symptoms: Modifying Factors:  Severity:  Timing: Context:   Objective  Well appearing patient in no apparent distress; mood and affect are within normal limits. Left Forearm - Anterior (5), Right Forearm - Anterior (5) Dozen hornlike 4 to 5 mm crusts, secondary  picking  Right Abdomen (side) - Upper Biopsy was from August 2022 but biopsy site easily identified by patient to me.    All skin waist up examined.   Assessment & Plan    Actinic keratosis (10) Left Forearm - Anterior (5); Right Forearm - Anterior (5)  Destruction of lesion - Left Forearm - Anterior, Right Forearm - Anterior Complexity: simple   Destruction method: cryotherapy   Informed consent: discussed and consent obtained   Timeout:  patient name, date of birth, surgical site, and procedure verified Lesion destroyed using liquid nitrogen: Yes   Cryotherapy cycles:  3 Outcome: patient tolerated procedure well with no complications   Post-procedure details: wound care instructions given    BCC (basal cell carcinoma), abdomen Right Abdomen (side) - Upper  Destruction of lesion Complexity: simple   Destruction method: electrodesiccation and curettage   Informed consent: discussed and consent obtained   Timeout:  patient name, date of birth, surgical site, and procedure verified Anesthesia: the lesion was anesthetized in a standard fashion   Anesthetic:  1% lidocaine w/ epinephrine 1-100,000 local infiltration Curettage performed in three different directions: Yes   Curettage cycles:  3 Lesion length (cm):  1.5 Lesion width (cm):  1.5 Margin per side (cm):  0 Final wound size (cm):  1.5 Hemostasis achieved with:  ferric subsulfate Outcome: patient  tolerated procedure well with no complications   Post-procedure details: sterile dressing applied and wound care instructions given   Dressing type: bandage and petrolatum   Additional details:  Wound inoculated with 5% Fluorouracil.        I, Lavonna Monarch, MD, have reviewed all documentation for this visit.  The documentation on 04/11/21 for the exam, diagnosis, procedures, and orders are all accurate and complete.

## 2021-06-03 ENCOUNTER — Ambulatory Visit: Payer: BC Managed Care – PPO | Admitting: Urology

## 2021-06-05 ENCOUNTER — Ambulatory Visit: Payer: BC Managed Care – PPO | Admitting: Urology

## 2021-06-23 ENCOUNTER — Ambulatory Visit (INDEPENDENT_AMBULATORY_CARE_PROVIDER_SITE_OTHER): Payer: BC Managed Care – PPO | Admitting: Dermatology

## 2021-06-23 ENCOUNTER — Encounter: Payer: Self-pay | Admitting: Dermatology

## 2021-06-23 DIAGNOSIS — Z85828 Personal history of other malignant neoplasm of skin: Secondary | ICD-10-CM | POA: Diagnosis not present

## 2021-06-23 DIAGNOSIS — L281 Prurigo nodularis: Secondary | ICD-10-CM

## 2021-06-23 DIAGNOSIS — L570 Actinic keratosis: Secondary | ICD-10-CM | POA: Diagnosis not present

## 2021-06-23 DIAGNOSIS — Z1283 Encounter for screening for malignant neoplasm of skin: Secondary | ICD-10-CM

## 2021-06-26 ENCOUNTER — Encounter: Payer: Self-pay | Admitting: Urology

## 2021-06-26 ENCOUNTER — Telehealth: Payer: Self-pay

## 2021-06-26 ENCOUNTER — Ambulatory Visit (INDEPENDENT_AMBULATORY_CARE_PROVIDER_SITE_OTHER): Payer: BC Managed Care – PPO | Admitting: Urology

## 2021-06-26 VITALS — BP 118/80 | HR 79 | Ht 69.0 in | Wt 215.0 lb

## 2021-06-26 DIAGNOSIS — R7989 Other specified abnormal findings of blood chemistry: Secondary | ICD-10-CM

## 2021-06-26 DIAGNOSIS — N400 Enlarged prostate without lower urinary tract symptoms: Secondary | ICD-10-CM

## 2021-06-26 DIAGNOSIS — E291 Testicular hypofunction: Secondary | ICD-10-CM

## 2021-06-26 DIAGNOSIS — N529 Male erectile dysfunction, unspecified: Secondary | ICD-10-CM | POA: Diagnosis not present

## 2021-06-26 LAB — URINALYSIS, ROUTINE W REFLEX MICROSCOPIC
Bilirubin, UA: NEGATIVE
Ketones, UA: NEGATIVE
Leukocytes,UA: NEGATIVE
Nitrite, UA: NEGATIVE
Protein,UA: NEGATIVE
RBC, UA: NEGATIVE
Specific Gravity, UA: 1.01 (ref 1.005–1.030)
Urobilinogen, Ur: 0.2 mg/dL (ref 0.2–1.0)
pH, UA: 6 (ref 5.0–7.5)

## 2021-06-26 MED ORDER — JATENZO 237 MG PO CAPS: 237.0000 mg | ORAL_CAPSULE | Freq: Two times a day (BID) | ORAL | 5 refills | Status: AC

## 2021-06-26 NOTE — Progress Notes (Signed)
? ?Assessment: ?1. Low testosterone   ?2. Organic impotence   ?3. BPH without obstruction/lower urinary tract symptoms   ? ? ?Plan: ?I reviewed the patient's records including all recent lab results. ?I had a lengthy discussion with him regarding the diagnosis of low testosterone and management options.  Specifically, we discussed short acting injections, topical therapy, oral therapy, long-acting injections, and subcutaneous pellets.  I also discussed the potential risk and side effects of testosterone replacement therapy.  I explained the need for close follow-up for patients on testosterone replacement therapy.  Due to his concern about transference, he is not interested in further topical therapy.  He also is needle phobic and is not interested in short acting injections.  He would like to try oral therapy. ?Prescription for Jatenzo 237 mg BID sent.  Use and side effects discussed. ?Prolactin level today ?Return to office in 4-6 weeks for repeat lab work ? ?Chief Complaint:  ?Chief Complaint  ?Patient presents with  ? Low testosterone  ? ? ?History of Present Illness: ? ?Thomas Pham is a 66 y.o. 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 ?He was previously followed in [facility] with his last visit with me in May 2021. ?He presents today for evaluation of low testosterone.  He underwent laboratory testing due to fatigue. ? ?Testosterone levels: ?11/22 235 ?12/22 227 ?5/23 183 ? ?LH and FSH were normal in 12/22. ? ?He was started on AndroGel 5 g daily.  He used this for approximately 3 weeks and discontinued due to to concerns about transference.  He did not see any improvement in his symptoms with use of the medication.  He reports that the gel did not absorb after extended period of time.  He is not currently receiving any testosterone replacement therapy. ?ADAM score = 5 ? ?His lower urinary tract symptoms remain stable.  He continues to have urinary  frequency, and intermittency.  No dysuria or gross hematuria. ?IPSS = 11 today. ? ?He continues to use sildenafil for erectile dysfunction with good results. ? ?Urologic History: ?He has a history of genital condyloma. The penile lesions were treated with cryotherapy in March 2020. ?He was found to have a small flesh-colored lesion on the penile shaft at his visit in May 2021. ? ?He has a history of organic impotence. History of ED for > 5 yrs with difficulty achieving and maintaining an erection. He was currently using generic sildenafil with good results. No side effects.  ? ?He continued to have frequency associated with his fluid intake and BP medication. No dysuria or gross hematuria.  ?AUA score = 10. ? ?PSA from 8/17 was 3.20.  ?PSA from 6/18 was 3.00. ?PSA from 7/19 was 3.74. ?PSA from 5/21 was 3.67. ?PSA from 11/22 was 3.60. ? ?He was last seen by Dr. Pilar Jarvis in November 2022.  No problems with recurrent genital condyloma at that time.  He continued to use sildenafil with good results.  His lower urinary tract symptoms were stable without any medications. ? ? ? ?Past Medical History:  ?Past Medical History:  ?Diagnosis Date  ? Allergy   ? Arthritis   ? osteo  ? Basal cell carcinoma 09/13/2014  ? BCC NODULAR LEFT CHEEK PER ST CLE[facility]  ? Basal cell carcinoma (BCC) 09/30/2020  ? SUP & NOD - R UPPER ABDOMEN (CX3FU)  ? Basal cell carcinoma of skin 09/14/2019  ? right malar cheek -MOHS  ? BCC (basal cell carcinoma  of skin) 09/09/2017  ? BCC MICRONODULAR LEFT CHEEK CX3 AND EXC  ? BCC (basal cell carcinoma of skin) 09/09/2017  ? [place]  ? Chronic kidney disease   ? Difficulty sleeping   ? GERD (gastroesophageal reflux disease)   ? takes nexium for acid reflux as needed  ? History of gout   ? History of hiatal hernia   ? umbilical hernia  ? History of kidney stones   ? Hyperlipidemia   ? Hypertension   ? Hypothyroidism   ? Low back pain   ? Medial meniscus tear   ? LEFT  ? Organic impotence    ? Penile cyst   ? Sleep apnea   ? not using mask d/t preference  ? Thyroid disease   ? ? ?Past Surgical History:  ?Past Surgical History:  ?Procedure Laterality Date  ? KNEE ARTHROSCOPY Left 03/07/2014  ? Procedure: LEFT ARTHROSCOPY KNEE WITH MEDIAL MENISCECTOMY  DEBRIDEMENT AND CHONDROPLASTY ;  Surgeon: Gearlean Alf, MD;  Location: [facility];  Service: Orthopedics;  Laterality: Left;  ? THYROIDECTOMY Right 2013  ? TONSILLECTOMY    ? childhood  ? TOTAL KNEE ARTHROPLASTY Left 03/04/2016  ? Procedure: LEFT TOTAL KNEE ARTHROPLASTY;  Surgeon: Gaynelle Arabian, MD;  Location: [facility];  Service: Orthopedics;  Laterality: Left;  ? ? ?Allergies:  ?No Known Allergies ? ?Family History:  ?Family History  ?Problem Relation Age of Onset  ? Hypertension Mother   ? Diabetes Mother   ?     prediabetes  ? Diabetes Father   ? Heart disease Father   ? Arthritis Father   ? Other Father   ?     alzheimer  ? Hyperlipidemia Father   ? Hypertension Father   ? Stroke Maternal Grandmother   ? Other Paternal Grandmother   ? Diabetes Paternal Grandmother   ? Heart disease Paternal Grandfather   ? Other Paternal Grandfather   ?     migraine headache  ? ? ?Social History:  ?Social History  ? ?Tobacco Use  ? Smoking status: Never  ? Smokeless tobacco: Never  ?Vaping Use  ? Vaping Use: Never used  ?Substance Use Topics  ? Alcohol use: Yes  ?  Comment: socially  ? Drug use: No  ? ? ?Review of symptoms:  ?Constitutional:  Negative for unexplained weight loss, night sweats, fever, chills ?ENT:  Negative for nose bleeds, sinus pain, painful swallowing ?CV:  Negative for chest pain, shortness of breath, exercise intolerance, palpitations, loss of consciousness ?Resp:  Negative for cough, wheezing, shortness of breath ?GI:  Negative for nausea, vomiting, diarrhea, bloody stools ?GU:  Positives noted in HPI; otherwise negative for gross hematuria, dysuria, urinary incontinence ?Neuro:  Negative for seizures, poor balance, limb weakness, slurred  speech ?Psych:  Negative for lack of energy, depression, anxiety ?Endocrine:  Negative for polydipsia, polyuria, symptoms of hypoglycemia (dizziness, hunger, sweating) ?Hematologic:  Negative for anemia, purpura, petechia, prolonged or excessive bleeding, use of anticoagulants  ?Allergic:  Negative for difficulty breathing or choking as a result of exposure to anything; no shellfish allergy; no allergic response (rash/itch) to materials, foods ? ?Physical exam: ?BP 118/80   Pulse 79   Ht '5\' 9"'$  (1.753 m)   Wt 215 lb (97.5 kg)   BMI 31.75 kg/m?  ?GENERAL APPEARANCE:  Well appearing, well developed, well nourished, NAD ?HEENT:  Atraumatic, normocephalic, oropharynx clear ?NECK:  Supple without lymphadenopathy or thyromegaly ?ABDOMEN:  Soft, non-tender, no masses ?EXTREMITIES:  Moves  all extremities well, without clubbing, cyanosis, or edema ?NEUROLOGIC:  Alert and oriented x 3, normal gait, CN II-XII grossly intact ?MENTAL STATUS:  appropriate ?BACK:  Non-tender to palpation, No CVAT ?SKIN:  Warm, dry, and intact ?GU: ?Penis: Circumcised, no lesions seen ?Meatus: Normal ?Scrotum: normal, no masses ?Testis: normal without masses bilateral ? ?Results: ?U/A dipstick negative ? ?

## 2021-06-26 NOTE — Telephone Encounter (Signed)
PA started today on covermymeds for Jatenzo ?

## 2021-06-27 LAB — PROLACTIN: Prolactin: 12.9 ng/mL (ref 4.0–15.2)

## 2021-07-01 ENCOUNTER — Encounter: Payer: Self-pay | Admitting: Urology

## 2021-07-02 ENCOUNTER — Ambulatory Visit: Payer: BC Managed Care – PPO | Attending: Neurosurgery

## 2021-07-02 DIAGNOSIS — M6281 Muscle weakness (generalized): Secondary | ICD-10-CM | POA: Diagnosis present

## 2021-07-02 DIAGNOSIS — M5459 Other low back pain: Secondary | ICD-10-CM | POA: Diagnosis present

## 2021-07-02 NOTE — Therapy (Signed)
[place] [facility] ?[address] ?[place], [place], [address] ?Phone: [phone]   Fax:  [phone] ? ?Physical Therapy Evaluation ? ?Patient Details  ?Name: Thomas Pham ?MRN: [id number] ?Date of Birth: Sep 14, 1955 ?Referring Provider (PT): Shearon Stalls, [place] ? ? ?Encounter Date: 07/02/2021 ? ? PT End of Session - 07/02/21 0900   ? ? Visit Number 1   ? Number of Visits 8   ? Date for PT Re-Evaluation 08/15/21   ? PT Start Time 0901   ? PT Stop Time 2694   ? PT Time Calculation (min) 44 min   ? Activity Tolerance Patient tolerated treatment well   ? Behavior During Therapy [facility] for tasks assessed/performed   ? ?  ?  ? ?  ? ? ?Past Medical History:  ?Diagnosis Date  ? Allergy   ? Arthritis   ? osteo  ? Basal cell carcinoma 09/13/2014  ? BCC NODULAR LEFT CHEEK PER ST CLE[facility]  ? Basal cell carcinoma (BCC) 09/30/2020  ? SUP & NOD - R UPPER ABDOMEN (CX3FU)  ? Basal cell carcinoma of skin 09/14/2019  ? right malar cheek -MOHS  ? BCC (basal cell carcinoma of skin) 09/09/2017  ? BCC MICRONODULAR LEFT CHEEK CX3 AND EXC  ? BCC (basal cell carcinoma of skin) 09/09/2017  ? [place]  ? Chronic kidney disease   ? Difficulty sleeping   ? GERD (gastroesophageal reflux disease)   ? takes nexium for acid reflux as needed  ? History of gout   ? History of hiatal hernia   ? umbilical hernia  ? History of kidney stones   ? Hyperlipidemia   ? Hypertension   ? Hypothyroidism   ? Low back pain   ? Medial meniscus tear   ? LEFT  ? Organic impotence   ? Penile cyst   ? Sleep apnea   ? not using mask d/t preference  ? Thyroid disease   ? ? ?Past Surgical History:  ?Procedure Laterality Date  ? KNEE ARTHROSCOPY Left 03/07/2014  ? Procedure: LEFT ARTHROSCOPY KNEE WITH MEDIAL MENISCECTOMY  DEBRIDEMENT AND CHONDROPLASTY ;  Surgeon: Gearlean Alf, MD;  Location: [facility];  Service: Orthopedics;  Laterality: Left;  ? THYROIDECTOMY Right 2013  ? TONSILLECTOMY    ? childhood  ? TOTAL KNEE  ARTHROPLASTY Left 03/04/2016  ? Procedure: LEFT TOTAL KNEE ARTHROPLASTY;  Surgeon: Gaynelle Arabian, MD;  Location: [facility];  Service: Orthopedics;  Laterality: Left;  ? ? ?There were no vitals filed for this visit. ? ? ? Subjective Assessment - 07/02/21 0901   ? ? Subjective Patient reports that he has been experiencing chronic low back pain since 2009. However, he notes that he exacerbated it in December 2022 when cleaning. This steadily improved until 3 weeks ago while working out when he aggravated it again. He notes that he was cutting a stump when he felt a pop in his back which caused this pain. He notes that his pain will randomly move from side to side, but it is primarily located on the right side.   ? Pertinent History OA   ? Limitations Walking;Standing;Lifting;House hold activities   ? How long can you stand comfortably? varies   ? How long can you walk comfortably? cannot walk comfortably   ? Patient Stated Goals get his legs stronger, reduced pain, stand up to 20-30 minutes   ? Currently in Pain? Yes   ? Pain Score 7    ? Pain Location  Back   ? Pain Orientation Lower;Right;Left   R>L  ? Pain Descriptors / Indicators Dull;Sharp;Aching   ? Pain Type Chronic pain   ? Pain Radiating Towards rarely radiates down into the left foot   ? Pain Onset More than a month ago   ? Pain Frequency Constant   ? Aggravating Factors  any walking, going up stairs   ? Pain Relieving Factors heat (does not last)   ? Effect of Pain on Daily Activities unable to exercise   ? ?  ?  ? ?  ? ? ? ? ? [facility] PT Assessment - 07/02/21 0001   ? ?  ? Assessment  ? Medical Diagnosis DDD, lumbar   ? Referring Provider (PT) Shearon Stalls, PA-C   ? Onset Date/Surgical Date --   2009 with exacerbation about 3 weeks ago  ? Next MD Visit 09/09/21   ? Prior Therapy Yes   ?  ? Precautions  ? Precautions None   ?  ? Restrictions  ? Weight Bearing Restrictions No   ?  ? Balance Screen  ? Has the patient fallen in the past 6 months No   ? Has the patient had a  decrease in activity level because of a fear of falling?  No   ? Is the patient reluctant to leave their home because of a fear of falling?  No   ?  ? Home Environment  ? Living Environment Private residence   ?  ? Prior Function  ? Level of Independence Independent   ? Vocation Full time employment   ? 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 Leisure exercising   ?  ? Cognition  ? Overall Cognitive Status Within Functional Limits for tasks assessed   ? Attention Focused   ? Focused Attention Appears intact   ? Memory Appears intact   ? Awareness Appears intact   ? Problem Solving Appears intact   ?  ? Observation/Other Assessments  ? Focus on Therapeutic Outcomes (FOTO)  52.98   ?  ? Sensation  ? Additional Comments Patient reports rare numbness and tingling   ?  ? ROM / Strength  ? AROM / PROM / Strength AROM;Strength   ?  ? AROM  ? AROM Assessment Site Lumbar   ? Lumbar Flexion 56   ? Lumbar Extension 20   anterior right hip pain  ? Lumbar - Right Side [facility]   ? Lumbar - Left Side Bend WFL   shooting pain across back when returning to upright stance  ? Lumbar - Right Rotation WFL   ? Lumbar - Left Rotation 25% limited   ?  ? Strength  ? Strength Assessment Site Hip;Knee;Ankle   ? Right/Left Hip Right;Left   ? Right Hip Flexion 4-/5   ? Left Hip Flexion 4/5   ? Right/Left Knee Left;Right   ? Right Knee Flexion 4/5   ? Right Knee Extension 4-/5   ? Left Knee Flexion 4+/5   ? Left Knee Extension 4+/5   ? Right/Left Ankle Right;Left   ? Right Ankle Dorsiflexion 4-/5   ? Left Ankle Dorsiflexion 4/5   ?  ? Palpation  ? Spinal mobility L3-4: reproduce sharp low back pain   ? Palpation comment No reported tenderness to palpation   ? ?  ?  ? ?  ? ? ? ? ? ? ? ? ? ? ? ? ? ?Objective measurements completed on examination: See above findings.  ? ? ? ? ?  [facility] Adult PT Treatment/Exercise - 07/02/21 0001   ? ?  ? Exercises  ? Exercises Lumbar   ?  ? Lumbar Exercises: Stretches  ? Single Knee to Chest  Stretch Right;Left;3 reps;30 seconds   ? Lower Trunk Rotation Other (comment)   2 minutes  ?  ? Lumbar Exercises: Supine  ? Bridge Non-compliant;15 reps   ? ?  ?  ? ?  ? ? ? ? ? ? ? ? ? ? ? ? ? ? ? PT Long Term Goals - 07/02/21 0917   ? ?  ? PT LONG TERM GOAL #1  ? Title Patient will be independent with his HEP   ? Time 4   ? Period Weeks   ? Status New   ? Target Date 07/30/21   ?  ? PT LONG TERM GOAL #2  ? Title Patient will be able to ascend at least 4 step with a reciprical pattern without being limited by his low back pain.   ? Time 4   ? Period Weeks   ? Status New   ? Target Date 07/30/21   ?  ? PT LONG TERM GOAL #3  ? Title Patient will be able to complete his daily activities without his familair low back pain exceeding 4/10.   ? Time 4   ? Period Weeks   ? Status New   ? Target Date 07/30/21   ? ?  ?  ? ?  ? ? ? ? ? ? ? ? ? Plan - 07/02/21 0912   ? ? Clinical Impression Statement Patient is a 66 year old male presenting to physical therapy with chronic low back pain with an acute exacerbation in his familiar symptoms approximately 3 weeks ago. He presented with moderate to high pain severity and irritability with left lumbar side bending and joint mobility testing to L3-4 reproducing his familiar pain. He was provided a HEP was able to reduce his familair pain while walking after he was able to properly demonstrate these interventions. Recommend that he continue with skilled physical therapy to address his remaining impairments to maximize his functional mobiltiy.   ? Personal Factors and Comorbidities Time since onset of injury/illness/exacerbation;Other;Comorbidity 2   ? Comorbidities OA, DM   ? Examination-Activity Limitations Locomotion Level;Stand;Lift   ? Examination-Participation Restrictions Cleaning;Community Activity;[employer] Work   ? Stability/Clinical Decision Making Evolving/Moderate complexity   ? Clinical Decision Making Moderate   ? Rehab Potential Good   ? PT Frequency 2x / week   ? PT  Duration 4 weeks   ? PT Treatment/Interventions ADLs/Self Care Home Management;Cryotherapy;Electrical Stimulation;Moist Heat;Traction;Neuromuscular re-education;Therapeutic exercise;Therapeutic activities;Functional mobil

## 2021-07-04 ENCOUNTER — Encounter: Payer: BC Managed Care – PPO | Admitting: Physical Therapy

## 2021-07-08 ENCOUNTER — Encounter: Payer: Self-pay | Admitting: Dermatology

## 2021-07-08 ENCOUNTER — Ambulatory Visit: Payer: BC Managed Care – PPO

## 2021-07-08 DIAGNOSIS — M6281 Muscle weakness (generalized): Secondary | ICD-10-CM

## 2021-07-08 DIAGNOSIS — M5459 Other low back pain: Secondary | ICD-10-CM

## 2021-07-08 NOTE — Telephone Encounter (Signed)
Dupixent [employer] paperwork and office notes faxed to [facility].

## 2021-07-08 NOTE — Progress Notes (Signed)
   Follow-Up Visit   Subjective  Thomas Pham is a 66 y.o. male who presents for the following: Follow-up (LN2 a few spots on back and forearms).  New crust on sun exposed areas arms, history of skin cancer left cheek,  chronic prurigo nodularis. Location:  Duration:  Quality:  Associated Signs/Symptoms: Modifying Factors:  Severity:  Timing: Context:   Objective  Well appearing patient in no apparent distress; mood and affect are within normal limits. Ongoing issue with prurigo nodularis, he frequently has at least 2 dozen spots with worst areas being on arms and shoulders.  This has a significant negative impact on the quality of his life.  We discussed in some detail the relatively recent approval of Dupixent for this indication and he is quite interested in trying this.  Waist up skin examination: No current atypical pigmented lesions or nonmelanoma skin cancer.  Left Forearm - Anterior (4), Right Antecubital Fossa, Right Forearm - Anterior (4) Roughly a dozen gritty pink to hornlike crusts arms>face. Past LN2 has helped.    All skin waist up examined.   Assessment & Plan    AK (actinic keratosis) (9) Right Antecubital Fossa; Left Forearm - Anterior (4); Right Forearm - Anterior (4)  Destruction of lesion - Left Forearm - Anterior (2), Right Antecubital Fossa, Right Forearm - Anterior (2) Complexity: simple   Destruction method: cryotherapy   Informed consent: discussed and consent obtained   Lesion destroyed using liquid nitrogen: Yes   Cryotherapy cycles:  5 Outcome: patient tolerated procedure well with no complications    Prurigo nodularis  Dupixent new start.  Encounter for screening for malignant neoplasm of skin  Yearly skin examination.  The challenge is and has been that many of his excoriated  Nodularis lesions on his arms do resemble nonmelanoma skin cancer so he knows to bring this to my attention if there is clinical worsening.      I,  Lavonna Monarch, MD, have reviewed all documentation for this visit.  The documentation on 07/08/21 for the exam, diagnosis, procedures, and orders are all accurate and complete.

## 2021-07-08 NOTE — Therapy (Signed)
[facility] [place], [place], [address] Phone: [phone]   Fax:  [phone]  Physical Therapy Treatment  Patient Details  Name: Thomas Pham MRN: [id number] Date of Birth: [birth year] Referring Provider (PT): Shearon Stalls, [place]   Encounter Date: 07/08/2021   PT End of Session - 07/08/21 1308     Visit Number 2    Number of Visits 8    Date for PT Re-Evaluation 08/15/21    PT Start Time 1300    PT Stop Time 1359    PT Time Calculation (min) 59 min    Activity Tolerance Patient tolerated treatment well    Behavior During Therapy [facility] for tasks assessed/performed             Past Medical History:  Diagnosis Date   Allergy    Arthritis    osteo   Basal cell carcinoma 09/13/2014   BCC NODULAR LEFT CHEEK PER ST CLEAR   Basal cell carcinoma (BCC) 09/30/2020   SUP & NOD - R UPPER ABDOMEN (CX3FU)   Basal cell carcinoma of skin 09/14/2019   right malar cheek -MOHS   BCC (basal cell carcinoma of skin) 09/09/2017   BCC MICRONODULAR LEFT CHEEK CX3 AND EXC   BCC (basal cell carcinoma of skin) 09/09/2017   BCC POSITIVE MARGIN MOHS DR PEARCE   Chronic kidney disease    Difficulty sleeping    GERD (gastroesophageal reflux disease)    takes nexium for acid reflux as needed   History of gout    History of hiatal hernia    umbilical hernia   History of kidney stones    Hyperlipidemia    Hypertension    Hypothyroidism    Low back pain    Medial meniscus tear    LEFT   Organic impotence    Penile cyst    Sleep apnea    not using mask d/t preference   Thyroid disease     Past Surgical History:  Procedure Laterality Date   KNEE ARTHROSCOPY Left 03/07/2014   Procedure: LEFT ARTHROSCOPY KNEE WITH MEDIAL MENISCECTOMY  DEBRIDEMENT AND CHONDROPLASTY ;  Surgeon: Gearlean Alf, MD;  Location: [facility];  Service: Orthopedics;  Laterality: Left;   THYROIDECTOMY Right 2013   TONSILLECTOMY     childhood   TOTAL KNEE  ARTHROPLASTY Left 03/04/2016   Procedure: LEFT TOTAL KNEE ARTHROPLASTY;  Surgeon: Gaynelle Arabian, MD;  Location: [facility];  Service: Orthopedics;  Laterality: Left;    There were no vitals filed for this visit.   Subjective Assessment - 07/08/21 1304     Subjective Pt arrives for today's treatment session reportig 5/10 low back pain.  Pt states that after last weeks appointment he twisted his back and was bedridden from last Wednesday until today.    Pertinent History OA    Limitations Walking;Standing;Lifting;House hold activities    How long can you stand comfortably? varies    How long can you walk comfortably? cannot walk comfortably    Patient Stated Goals get his legs stronger, reduced pain, stand up to 20-30 minutes    Currently in Pain? Yes    Pain Score 5     Pain Location Back    Pain Orientation Lower;Right;Left    Pain Onset More than a month ago                               [facility] Adult PT Treatment/Exercise -  07/08/21 0001       Lumbar Exercises: Aerobic   Nustep Lvl 3 x 15 mins      Modalities   Modalities Electrical Stimulation;Moist Heat      Moist Heat Therapy   Number Minutes Moist Heat 15 Minutes    Moist Heat Location Lumbar Spine      Electrical Stimulation   Electrical Stimulation Location Right lumbar spine    Electrical Stimulation Action IFC    Electrical Stimulation Parameters 80-150 Hz x 15 mins    Electrical Stimulation Goals Pain;Tone      Manual Therapy   Manual Therapy Soft tissue mobilization    Soft tissue mobilization STW/M to right lumbar paraspinals, QL, and right upper gluteals to decrease pain and tone                          PT Long Term Goals - 07/02/21 0917       PT LONG TERM GOAL #1   Title Patient will be independent with his HEP    Time 4    Period Weeks    Status New    Target Date 07/30/21      PT LONG TERM GOAL #2   Title Patient will be able to ascend at least 4 step with a  reciprical pattern without being limited by his low back pain.    Time 4    Period Weeks    Status New    Target Date 07/30/21      PT LONG TERM GOAL #3   Title Patient will be able to complete his daily activities without his familair low back pain exceeding 4/10.    Time 4    Period Weeks    Status New    Target Date 07/30/21                   Plan - 07/08/21 1309     Clinical Impression Statement Pt arrives for today's treatment session reporting 5/10 low back pain.  Pt states that he hurts his back last Wednesday and had been bedridden until yesterday.  Pt states that he is feeling better today, but wants to be very careful with is back.  STW/M performed to right lumbar paraspinals with pt positioned in right side-lying for comfort to decrease pain and tone.  Normal responses to estim and MH noted upon removal.  Pt reported 4/10 low back pain at completion of today's treatment session.    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/Moderate complexity    Rehab Potential Good    PT Frequency 2x / week    PT Duration 4 weeks    PT Treatment/Interventions ADLs/Self Care Home Management;Cryotherapy;Electrical Stimulation;Moist Heat;Traction;Neuromuscular re-education;Therapeutic exercise;Therapeutic activities;Functional mobility training;Stair training;Patient/family education;Manual techniques;Dry needling;Taping    PT Next Visit Plan nustep, core stability progression, lower extremity strengthening, and modalities as needed    PT Home Exercise Plan Access Code: [email or web address]  URL: [email or web address]  Date: 07/02/2021  Prepared by: Jacqulynn Cadet    Exercises  - Supine Bridge  - 1 x daily - 7 x  weekly - 2 sets - 10 reps  - Lower Trunk Rotations  - 1 x daily - 7 x weekly - 2 sets - 10 reps  - Hooklying Single Knee  to Chest Stretch  - 1 x daily - 7 x weekly - 3 sets - 30 seconds hold    Consulted and Agree with Plan of Care Patient             Patient will benefit from skilled therapeutic intervention in order to improve the following deficits and impairments:  Decreased range of motion, Difficulty walking, Decreased activity tolerance, Pain, Decreased strength, Decreased mobility  Visit Diagnosis: Other low back pain  Muscle weakness (generalized)     Problem List Patient Active Problem List   Diagnosis Date Noted   Low testosterone 06/26/2021   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   BPH without obstruction/lower urinary tract symptoms 06/26/2021   Severe obstructive sleep apnea-hypopnea syndrome 10/12/2018   OA (osteoarthritis) of knee 03/04/2016   Acute medial meniscal tear 03/06/2014   Cold hands and feet 08/23/2012    Kathrynn Ducking, PTA 07/08/2021, 2:02 PM  [facility] [address] [place], [place], [address] Phone: [phone]   Fax:  [phone]  Name: DARTANIAN KNAGGS MRN: [id number] Date of Birth: 1955-06-01

## 2021-07-08 NOTE — Telephone Encounter (Signed)
Dupixent prescription sent to [facility] via the portal.

## 2021-07-09 ENCOUNTER — Encounter: Payer: Self-pay | Admitting: Urology

## 2021-07-09 ENCOUNTER — Telehealth: Payer: Self-pay

## 2021-07-09 NOTE — Telephone Encounter (Signed)
Fax received from [facility] stating that they have completed their welcome call to the patient and they will call him again when its time to schedule delivery.

## 2021-07-10 ENCOUNTER — Ambulatory Visit: Payer: BC Managed Care – PPO | Admitting: *Deleted

## 2021-07-10 DIAGNOSIS — M5459 Other low back pain: Secondary | ICD-10-CM

## 2021-07-10 DIAGNOSIS — M6281 Muscle weakness (generalized): Secondary | ICD-10-CM

## 2021-07-10 NOTE — Therapy (Signed)
[facility] [place], [place], [address] Phone: [phone]   Fax:  [phone]  Physical Therapy Treatment  Patient Details  Name: Thomas Pham MRN: [id number] Date of Birth: 27-Oct-1955 Referring Provider (PT): Shearon Stalls, [place]   Encounter Date: 07/10/2021   PT End of Session - 07/10/21 0922     Visit Number 3    Number of Visits 8    Date for PT Re-Evaluation 08/15/21    PT Start Time 0900    PT Stop Time 0946    PT Time Calculation (min) 46 min             Past Medical History:  Diagnosis Date   Allergy    Arthritis    osteo   Basal cell carcinoma 09/13/2014   BCC NODULAR LEFT CHEEK PER ST [facility]   Basal cell carcinoma (BCC) 09/30/2020   SUP & NOD - R UPPER ABDOMEN (CX3FU)   Basal cell carcinoma of skin 09/14/2019   right malar cheek -MOHS   BCC (basal cell carcinoma of skin) 09/09/2017   BCC MICRONODULAR LEFT CHEEK CX3 AND EXC   BCC (basal cell carcinoma of skin) 09/09/2017   BCC POSITIVE MARGIN MOHS DR PEARCE   Chronic kidney disease    Difficulty sleeping    GERD (gastroesophageal reflux disease)    takes nexium for acid reflux as needed   History of gout    History of hiatal hernia    umbilical hernia   History of kidney stones    Hyperlipidemia    Hypertension    Hypothyroidism    Low back pain    Medial meniscus tear    LEFT   Organic impotence    Penile cyst    Sleep apnea    not using mask d/t preference   Thyroid disease     Past Surgical History:  Procedure Laterality Date   KNEE ARTHROSCOPY Left 03/07/2014   Procedure: LEFT ARTHROSCOPY KNEE WITH MEDIAL MENISCECTOMY  DEBRIDEMENT AND CHONDROPLASTY ;  Surgeon: Gearlean Alf, MD;  Location: [facility];  Service: Orthopedics;  Laterality: Left;   THYROIDECTOMY Right 2013   TONSILLECTOMY     childhood   TOTAL KNEE ARTHROPLASTY Left 03/04/2016   Procedure: LEFT TOTAL KNEE ARTHROPLASTY;  Surgeon: Gaynelle Arabian, MD;  Location: [facility];   Service: Orthopedics;  Laterality: Left;    There were no vitals filed for this visit.   Subjective Assessment - 07/10/21 0918     Subjective Pt arrives for today's treatment session reportig 5/10 low back pain. I keep agravating it.    Pertinent History OA    Limitations Walking;Standing;Lifting;House hold activities    How long can you stand comfortably? varies    How long can you walk comfortably? cannot walk comfortably    Patient Stated Goals get his legs stronger, reduced pain, stand up to 20-30 minutes    Currently in Pain? Yes    Pain Score 5     Pain Location Back    Pain Orientation Right;Left;Lower    Pain Descriptors / Indicators Dull    Pain Type Chronic pain    Pain Onset More than a month ago                               [facility] Adult PT Treatment/Exercise - 07/10/21 0001       Self-Care   Self-Care ADL's;Posture    ADL's Discussed ADL's  and postures   AB bracing practiced with bodymechanics and transitional movements      Exercises   Exercises Lumbar      Lumbar Exercises: Aerobic   Nustep Lvl 3 x 10 mins      Lumbar Exercises: Standing   Other Standing Lumbar Exercises Modified bird dog performed standing at counter x 6 each Arm raise hold 5 secs each. verbal and tactile cues needed for technique    Other Standing Lumbar Exercises handout given for HEP. Hooklying leg raise was also discussed and given in HO.                          PT Long Term Goals - 07/02/21 0917       PT LONG TERM GOAL #1   Title Patient will be independent with his HEP    Time 4    Period Weeks    Status New    Target Date 07/30/21      PT LONG TERM GOAL #2   Title Patient will be able to ascend at least 4 step with a reciprical pattern without being limited by his low back pain.    Time 4    Period Weeks    Status New    Target Date 07/30/21      PT LONG TERM GOAL #3   Title Patient will be able to complete his daily activities  without his familair low back pain exceeding 4/10.    Time 4    Period Weeks    Status New    Target Date 07/30/21                   Plan - 07/10/21 1705     Clinical Impression Statement Pt arrived today reporting being careful not to aggravate his LB. Rx focused on AB bracing with neutral pelvis while performing ADLs and transitional movements to avoid pain triggers as well as focusing on core activation. He was able to perform modified bird-dog at counter in standing with just arm raises today and tolerated well. Hooklying leg raise was also discussed with AB bracing for core activation.. Handout given for modified bird-dog    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/Moderate complexity    Rehab Potential Good    PT Frequency 2x / week    PT Duration 4 weeks    PT Treatment/Interventions ADLs/Self Care Home Management;Cryotherapy;Electrical Stimulation;Moist Heat;Traction;Neuromuscular re-education;Therapeutic exercise;Therapeutic activities;Functional mobility training;Stair training;Patient/family education;Manual techniques;Dry needling;Taping    PT Next Visit Plan nustep, core stability progression, lower extremity strengthening, and modalities as needed    Consulted and Agree with Plan of Care Patient             Patient will benefit from skilled therapeutic intervention in order to improve the following deficits and impairments:  Decreased range of motion, Difficulty walking, Decreased activity tolerance, Pain, Decreased strength, Decreased mobility  Visit Diagnosis: Other low back pain  Muscle weakness (generalized)     Problem List Patient Active Problem List   Diagnosis Date Noted    Low testosterone 06/26/2021   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   BPH without obstruction/lower urinary tract symptoms 06/26/2021   Severe obstructive sleep apnea-hypopnea syndrome 10/12/2018   OA (osteoarthritis) of knee 03/04/2016   Acute medial meniscal  tear 03/06/2014   Cold hands and feet 08/23/2012    Duffy Dantonio,CHRIS, PTA 07/10/2021, 5:15 PM  [facility] [place], [place], [address] Phone: [phone]   Fax:  [phone]  Name: Thomas Pham MRN: [id number] Date of Birth: January 20, 1956

## 2021-07-11 ENCOUNTER — Telehealth: Payer: Self-pay

## 2021-07-11 NOTE — Telephone Encounter (Signed)
[employer] denied his rx for Thomas Pham, is there an alternative medication you can send in for him to try.

## 2021-07-15 ENCOUNTER — Ambulatory Visit: Payer: BC Managed Care – PPO | Admitting: Physical Therapy

## 2021-07-15 ENCOUNTER — Telehealth: Payer: Self-pay

## 2021-07-15 ENCOUNTER — Encounter: Payer: Self-pay | Admitting: Physical Therapy

## 2021-07-15 DIAGNOSIS — M6281 Muscle weakness (generalized): Secondary | ICD-10-CM

## 2021-07-15 DIAGNOSIS — M5459 Other low back pain: Secondary | ICD-10-CM

## 2021-07-15 NOTE — Therapy (Signed)
[facility] [place], [place], [address] Phone: [phone]   Fax:  [phone]  Physical Therapy Treatment  Patient Details  Name: Thomas Pham MRN: [id number] Date of Birth: [birth year] Referring Provider (PT): Shearon Stalls, [place]   Encounter Date: 07/15/2021   PT End of Session - 07/15/21 0904     Visit Number 4    Number of Visits 8    Date for PT Re-Evaluation 08/15/21    PT Start Time 0902    PT Stop Time 0945    PT Time Calculation (min) 43 min    Activity Tolerance Patient tolerated treatment well    Behavior During Therapy [facility] for tasks assessed/performed             Past Medical History:  Diagnosis Date   Allergy    Arthritis    osteo   Basal cell carcinoma 09/13/2014   BCC NODULAR LEFT CHEEK PER ST CLEAR   Basal cell carcinoma (BCC) 09/30/2020   SUP & NOD - R UPPER ABDOMEN (CX3FU)   Basal cell carcinoma of skin 09/14/2019   right malar cheek -MOHS   BCC (basal cell carcinoma of skin) 09/09/2017   BCC MICRONODULAR LEFT CHEEK CX3 AND EXC   BCC (basal cell carcinoma of skin) 09/09/2017   BCC POSITIVE MARGIN MOHS DR PEARCE   Chronic kidney disease    Difficulty sleeping    GERD (gastroesophageal reflux disease)    takes nexium for acid reflux as needed   History of gout    History of hiatal hernia    umbilical hernia   History of kidney stones    Hyperlipidemia    Hypertension    Hypothyroidism    Low back pain    Medial meniscus tear    LEFT   Organic impotence    Penile cyst    Sleep apnea    not using mask d/t preference   Thyroid disease     Past Surgical History:  Procedure Laterality Date   KNEE ARTHROSCOPY Left 03/07/2014   Procedure: LEFT ARTHROSCOPY KNEE WITH MEDIAL MENISCECTOMY  DEBRIDEMENT AND CHONDROPLASTY ;  Surgeon: Gearlean Alf, MD;  Location: [facility];  Service: Orthopedics;  Laterality: Left;   THYROIDECTOMY Right 2013   TONSILLECTOMY     childhood   TOTAL KNEE  ARTHROPLASTY Left 03/04/2016   Procedure: LEFT TOTAL KNEE ARTHROPLASTY;  Surgeon: Gaynelle Arabian, MD;  Location: [facility];  Service: Orthopedics;  Laterality: Left;    There were no vitals filed for this visit.   Subjective Assessment - 07/15/21 0859     Subjective Felt great yesterday but still cautious of twisting. Pain more centralized today.    Pertinent History OA    Limitations Walking;Standing;Lifting;House hold activities    How long can you stand comfortably? varies    How long can you walk comfortably? cannot walk comfortably    Patient Stated Goals get his legs stronger, reduced pain, stand up to 20-30 minutes    Currently in Pain? Yes    Pain Score 5     Pain Location Back    Pain Orientation Lower    Pain Descriptors / Indicators Discomfort    Pain Type Chronic pain    Pain Onset More than a month ago    Pain Frequency Constant                [facility] PT Assessment - 07/15/21 0001       Assessment   Medical Diagnosis  DDD, lumbar    Referring Provider (PT) Marlynn Perking    Next MD Visit 09/09/21    Prior Therapy Yes      Precautions   Precautions None      Restrictions   Weight Bearing Restrictions No                           [facility] Adult PT Treatment/Exercise - 07/15/21 0001       Lumbar Exercises: Aerobic   Nustep Lvl 3 x 10 mins      Lumbar Exercises: Standing   Other Standing Lumbar Exercises Modified bird dog performed standing at counter x 6 each Arm raise hold 5 secs each, LE raise x6 reps 5 sec holds      Lumbar Exercises: Seated   Sit to Stand 10 reps   focus on posture     Lumbar Exercises: Supine   Clam 15 reps;5 seconds;Limitations    Clam Limitations yellow theraband    Bent Knee Raise 15 reps;2 seconds;Limitations    Bent Knee Raise Limitations yellow theraband    Bridge 10 reps;2 seconds    Other Supine Lumbar Exercises Adductor squeeze x10 reps 5 sec holds    Other Supine Lumbar Exercises Opp arm/leg with ball x15  reps 3 sec holds      Lumbar Exercises: Sidelying   Clam Both;10 reps;2 seconds                     PT Education - 07/15/21 0945     Education Details [id number]    Person(s) Educated Patient    Methods Explanation;Demonstration;Handout    Comprehension Verbalized understanding;Returned demonstration                 PT Long Term Goals - 07/02/21 0917       PT LONG TERM GOAL #1   Title Patient will be independent with his HEP    Time 4    Period Weeks    Status New    Target Date 07/30/21      PT LONG TERM GOAL #2   Title Patient will be able to ascend at least 4 step with a reciprical pattern without being limited by his low back pain.    Time 4    Period Weeks    Status New    Target Date 07/30/21      PT LONG TERM GOAL #3   Title Patient will be able to complete his daily activities without his familair low back pain exceeding 4/10.    Time 4    Period Weeks    Status New    Target Date 07/30/21                   Plan - 07/15/21 0949     Clinical Impression Statement Patient presented in clinic with moderate LBP. Patient states that he felt great yesterday but still struggling to sleep and find a comfortable position. He is sleeping a max of 5 hours now with use of Melatonin but struggles once he wakes up. Patient progressed to more stablization and [facility] strengthening. Minimal reports of discomfort with R hip marching in supine. Patient still very cautious with any therex and movement. Patient also provided new HEP with education of parameters and technique. No complaints following end of treatment. Patient also advised that if any HEP exercise or activity causes pain to stop and report to PT for modification.  Personal Factors and Comorbidities Time since onset of injury/illness/exacerbation;Other;Comorbidity 2    Comorbidities OA, DM    Examination-Activity Limitations Locomotion Level;Stand;Lift    Examination-Participation Restrictions  Cleaning;Community Activity;Yard Work    Merchant [employer] officer Evolving/Moderate complexity    Rehab Potential Good    PT Frequency 2x / week    PT Duration 4 weeks    PT Treatment/Interventions ADLs/Self Care Home Management;Cryotherapy;Electrical Stimulation;Moist Heat;Traction;Neuromuscular re-education;Therapeutic exercise;Therapeutic activities;Functional mobility training;Stair training;Patient/family education;Manual techniques;Dry needling;Taping    PT Next Visit Plan nustep, core stability progression, lower extremity strengthening, and modalities as needed    PT Home Exercise Plan [email or web address]    Consulted and Agree with Plan of Care Patient             Patient will benefit from skilled therapeutic intervention in order to improve the following deficits and impairments:  Decreased range of motion, Difficulty walking, Decreased activity tolerance, Pain, Decreased strength, Decreased mobility  Visit Diagnosis: Other low back pain  Muscle weakness (generalized)     Problem List Patient Active Problem List   Diagnosis Date Noted   Low testosterone 06/26/2021   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   BPH without obstruction/lower urinary tract symptoms 06/26/2021   Severe obstructive sleep apnea-hypopnea syndrome 10/12/2018   OA (osteoarthritis) of knee 03/04/2016   Acute medial meniscal tear 03/06/2014   Cold hands and feet 08/23/2012    Standley Brooking, PTA 07/15/2021, 9:54 AM  [facility] [place], [place], [address] Phone: [phone]   Fax:  [phone]  Name: DEIONDRE HARROWER MRN: [id number] Date of Birth: 04-14-1955

## 2021-07-15 NOTE — Telephone Encounter (Signed)
Fax received from [facility] stating that they have received the patient's enrollment form.

## 2021-07-15 NOTE — Telephone Encounter (Signed)
FYI an appeal to [employer] was faxed in today, called the patient to inform him.  He states that if they deny the appeal his next preferred method would be the injection.

## 2021-07-17 ENCOUNTER — Ambulatory Visit: Payer: BC Managed Care – PPO | Attending: Neurosurgery | Admitting: Physical Therapy

## 2021-07-17 ENCOUNTER — Encounter: Payer: Self-pay | Admitting: Physical Therapy

## 2021-07-17 DIAGNOSIS — M6281 Muscle weakness (generalized): Secondary | ICD-10-CM | POA: Insufficient documentation

## 2021-07-17 DIAGNOSIS — M5459 Other low back pain: Secondary | ICD-10-CM | POA: Insufficient documentation

## 2021-07-17 NOTE — Therapy (Signed)
[facility] [place], [place], [address] Phone: [phone]   Fax:  [phone]  Physical Therapy Treatment  Patient Details  Name: Thomas Pham MRN: [id number] Date of Birth: Jan 13, 1956 Referring Provider (PT): Shearon Stalls, [place]   Encounter Date: 07/17/2021   PT End of Session - 07/17/21 0914     Visit Number 5    Number of Visits 8    Date for PT Re-Evaluation 08/15/21    PT Start Time 0902    PT Stop Time 0948    PT Time Calculation (min) 46 min    Activity Tolerance Patient tolerated treatment well    Behavior During Therapy [facility] for tasks assessed/performed             Past Medical History:  Diagnosis Date   Allergy    Arthritis    osteo   Basal cell carcinoma 09/13/2014   BCC NODULAR LEFT CHEEK PER ST CLEAR   Basal cell carcinoma (BCC) 09/30/2020   SUP & NOD - R UPPER ABDOMEN (CX3FU)   Basal cell carcinoma of skin 09/14/2019   right malar cheek -MOHS   BCC (basal cell carcinoma of skin) 09/09/2017   BCC MICRONODULAR LEFT CHEEK CX3 AND EXC   BCC (basal cell carcinoma of skin) 09/09/2017   BCC POSITIVE MARGIN MOHS DR PEARCE   Chronic kidney disease    Difficulty sleeping    GERD (gastroesophageal reflux disease)    takes nexium for acid reflux as needed   History of gout    History of hiatal hernia    umbilical hernia   History of kidney stones    Hyperlipidemia    Hypertension    Hypothyroidism    Low back pain    Medial meniscus tear    LEFT   Organic impotence    Penile cyst    Sleep apnea    not using mask d/t preference   Thyroid disease     Past Surgical History:  Procedure Laterality Date   KNEE ARTHROSCOPY Left 03/07/2014   Procedure: LEFT ARTHROSCOPY KNEE WITH MEDIAL MENISCECTOMY  DEBRIDEMENT AND CHONDROPLASTY ;  Surgeon: Gearlean Alf, MD;  Location: [facility];  Service: Orthopedics;  Laterality: Left;   THYROIDECTOMY Right 2013   TONSILLECTOMY     childhood   TOTAL KNEE  ARTHROPLASTY Left 03/04/2016   Procedure: LEFT TOTAL KNEE ARTHROPLASTY;  Surgeon: Gaynelle Arabian, MD;  Location: [facility];  Service: Orthopedics;  Laterality: Left;    There were no vitals filed for this visit.   Subjective Assessment - 07/17/21 0913     Subjective Reports limited sleep due to pain over the last few days. Has felt okay during the day but pain returns at night.    Pertinent History OA    Limitations Walking;Standing;Lifting;House hold activities    How long can you stand comfortably? varies    How long can you walk comfortably? cannot walk comfortably    Patient Stated Goals get his legs stronger, reduced pain, stand up to 20-30 minutes    Currently in Pain? Yes    Pain Score 5     Pain Location Back    Pain Orientation Lower;Right    Pain Descriptors / Indicators Dull;Aching    Pain Type Chronic pain    Pain Onset More than a month ago    Pain Frequency Constant                [facility] PT Assessment - 07/17/21 0001  Assessment   Medical Diagnosis DDD, lumbar    Referring Provider (PT) Marlynn Perking    Next MD Visit 09/09/21    Prior Therapy Yes      Precautions   Precautions None                           [facility] Adult PT Treatment/Exercise - 07/17/21 0001       Lumbar Exercises: Aerobic   Nustep Lvl 3 x 10 mins      Lumbar Exercises: Standing   Other Standing Lumbar Exercises Modified bird dog performed standing at counter x 6 each Arm raise hold 5 secs each,    Other Standing Lumbar Exercises HIp ext x15 reps, abd BLE x5  reps each; standing core press x10 reps 5 sec holds   abd stopped due to pain     Lumbar Exercises: Supine   Clam 15 reps;2 seconds    Heel Slides 10 reps    Bent Knee Raise 10 reps;2 seconds    Bridge 10 reps;2 seconds    Other Supine Lumbar Exercises Adductor squeeze x10 reps 5 sec holds    Other Supine Lumbar Exercises Opp arm/leg with ball x15 reps 5 sec holds      Modalities   Modalities Electrical  Stimulation      Electrical Stimulation   Electrical Stimulation Location mid- R low back    Electrical Stimulation Action Pre-Mod    Electrical Stimulation Parameters 80-150 hz x10 min    Electrical Stimulation Goals Pain                          PT Long Term Goals - 07/17/21 0955       PT LONG TERM GOAL #1   Title Patient will be independent with his HEP    Time 4    Period Weeks    Status On-going    Target Date 07/30/21      PT LONG TERM GOAL #2   Title Patient will be able to ascend at least 4 step with a reciprical pattern without being limited by his low back pain.    Time 4    Period Weeks    Status On-going    Target Date 07/30/21      PT LONG TERM GOAL #3   Title Patient will be able to complete his daily activities without his familair low back pain exceeding 4/10.    Time 4    Period Weeks    Status On-going    Target Date 07/30/21                   Plan - 07/17/21 0946     Clinical Impression Statement Patient presented in clinic with reports of less sleep in the past few days due to pain. Patient achieving minimal sleep at this time. Patient unable to rest on the heating pad due to pressure on the low back. Patient slowly guided through lumbar/hip exercises which were limited with hip abduction due to pain with BLEs. Lighter reps and no resistance to limit pain. Electrical stimulation to low back completed today to assist in pain relief.    Personal Factors and Comorbidities Time since onset of injury/illness/exacerbation;Other;Comorbidity 2    Comorbidities OA, DM    Examination-Activity Limitations Locomotion Level;Stand;Lift    Examination-Participation Restrictions Cleaning;Community Activity;Yard Work    Scientist, research (medical)  Potential Good    PT Frequency 2x / week    PT Duration 4 weeks    PT Treatment/Interventions ADLs/Self Care Home Management;Cryotherapy;Electrical  Stimulation;Moist Heat;Traction;Neuromuscular re-education;Therapeutic exercise;Therapeutic activities;Functional mobility training;Stair training;Patient/family education;Manual techniques;Dry needling;Taping    PT Next Visit Plan nustep, core stability progression, lower extremity strengthening, and modalities as needed    PT Home Exercise Plan [email or web address]    Consulted and Agree with Plan of Care Patient             Patient will benefit from skilled therapeutic intervention in order to improve the following deficits and impairments:  Decreased range of motion, Difficulty walking, Decreased activity tolerance, Pain, Decreased strength, Decreased mobility  Visit Diagnosis: Other low back pain  Muscle weakness (generalized)     Problem List Patient Active Problem List   Diagnosis Date Noted   Low testosterone 06/26/2021   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   BPH without obstruction/lower urinary tract symptoms 06/26/2021   Severe obstructive sleep apnea-hypopnea syndrome 10/12/2018   OA (osteoarthritis) of knee 03/04/2016   Acute medial meniscal tear 03/06/2014   Cold hands and feet 08/23/2012   Rationale for Evaluation and Treatment Rehabilitation   Standley Brooking, PTA 07/17/2021, 9:55 AM  [facility] [place], [place], [address] Phone: [phone]   Fax:  [phone]  Name: Thomas Pham MRN: [id number] Date of Birth: [birth year]

## 2021-07-18 ENCOUNTER — Encounter: Payer: BC Managed Care – PPO | Admitting: Physical Therapy

## 2021-07-21 ENCOUNTER — Encounter: Payer: BC Managed Care – PPO | Admitting: Physical Therapy

## 2021-07-22 ENCOUNTER — Ambulatory Visit: Payer: BC Managed Care – PPO | Admitting: Urology

## 2021-07-23 ENCOUNTER — Ambulatory Visit: Payer: BC Managed Care – PPO

## 2021-07-23 DIAGNOSIS — M6281 Muscle weakness (generalized): Secondary | ICD-10-CM

## 2021-07-23 DIAGNOSIS — M5459 Other low back pain: Secondary | ICD-10-CM | POA: Diagnosis not present

## 2021-07-23 NOTE — Therapy (Signed)
[facility] [place], [place], [address] Phone: [phone]   Fax:  [phone]  Physical Therapy Treatment  Patient Details  Name: Thomas Pham MRN: [id number] Date of Birth: 1955-03-12 Referring Provider (PT): Shearon Stalls, [place]   Encounter Date: 07/23/2021   PT End of Session - 07/23/21 0902     Visit Number 6    Number of Visits 8    Date for PT Re-Evaluation 08/15/21    PT Start Time 0900    PT Stop Time 0957    PT Time Calculation (min) 57 min    Activity Tolerance Patient tolerated treatment well    Behavior During Therapy [facility] for tasks assessed/performed             Past Medical History:  Diagnosis Date   Allergy    Arthritis    osteo   Basal cell carcinoma 09/13/2014   BCC NODULAR LEFT CHEEK PER ST CLEAR   Basal cell carcinoma (BCC) 09/30/2020   SUP & NOD - R UPPER ABDOMEN (CX3FU)   Basal cell carcinoma of skin 09/14/2019   right malar cheek -MOHS   BCC (basal cell carcinoma of skin) 09/09/2017   BCC MICRONODULAR LEFT CHEEK CX3 AND EXC   BCC (basal cell carcinoma of skin) 09/09/2017   BCC POSITIVE MARGIN MOHS DR PEARCE   Chronic kidney disease    Difficulty sleeping    GERD (gastroesophageal reflux disease)    takes nexium for acid reflux as needed   History of gout    History of hiatal hernia    umbilical hernia   History of kidney stones    Hyperlipidemia    Hypertension    Hypothyroidism    Low back pain    Medial meniscus tear    LEFT   Organic impotence    Penile cyst    Sleep apnea    not using mask d/t preference   Thyroid disease     Past Surgical History:  Procedure Laterality Date   KNEE ARTHROSCOPY Left 03/07/2014   Procedure: LEFT ARTHROSCOPY KNEE WITH MEDIAL MENISCECTOMY  DEBRIDEMENT AND CHONDROPLASTY ;  Surgeon: Gearlean Alf, MD;  Location: [facility];  Service: Orthopedics;  Laterality: Left;   THYROIDECTOMY Right 2013   TONSILLECTOMY     childhood   TOTAL KNEE  ARTHROPLASTY Left 03/04/2016   Procedure: LEFT TOTAL KNEE ARTHROPLASTY;  Surgeon: Gaynelle Arabian, MD;  Location: [facility];  Service: Orthopedics;  Laterality: Left;    There were no vitals filed for this visit.   Subjective Assessment - 07/23/21 0900     Subjective Patient reports that his back felt great Monday and Tuesday. However, he felt he may have slept wrong last night causing his back to really bother him.    Pertinent History OA    Limitations Walking;Standing;Lifting;House hold activities    How long can you stand comfortably? varies    How long can you walk comfortably? cannot walk comfortably    Patient Stated Goals get his legs stronger, reduced pain, stand up to 20-30 minutes    Currently in Pain? Yes    Pain Score 5     Pain Location Back    Pain Onset More than a month ago                               [facility] Adult PT Treatment/Exercise - 07/23/21 0001       Lumbar  Exercises: Stretches   Single Knee to Chest Stretch Right;Left;10 seconds   10 reps   Double Knee to Chest Stretch Other (comment)   2 minutes; continuous movement   Piriformis Stretch Right;3 reps;30 seconds    Figure 4 Stretch 3 reps;30 seconds;Supine;With overpressure   patient provided overpressure; RLE   Other Lumbar Stretch Exercise Ball roll out   multidirectional; 20 reps each     Lumbar Exercises: Aerobic   Nustep Lvl 3 x 10 mins      Lumbar Exercises: Standing   Shoulder Extension Both;15 reps   2 sets   Shoulder Extension Limitations Blue XTS      Lumbar Exercises: Supine   Bridge 10 reps    Straight Leg Raise 20 reps   BLE     Modalities   Modalities Electrical Stimulation   no redness or adverse response with today's modalities     Electrical Stimulation   Electrical Stimulation Location right QL    Electrical Stimulation Action IFC    Electrical Stimulation Parameters 80-150 Hz w/ 40% scan x 10 minutes    Electrical Stimulation Goals Pain                           PT Long Term Goals - 07/17/21 0955       PT LONG TERM GOAL #1   Title Patient will be independent with his HEP    Time 4    Period Weeks    Status On-going    Target Date 07/30/21      PT LONG TERM GOAL #2   Title Patient will be able to ascend at least 4 step with a reciprical pattern without being limited by his low back pain.    Time 4    Period Weeks    Status On-going    Target Date 07/30/21      PT LONG TERM GOAL #3   Title Patient will be able to complete his daily activities without his familair low back pain exceeding 4/10.    Time 4    Period Weeks    Status On-going    Target Date 07/30/21                   Plan - 07/23/21 0903     Clinical Impression Statement Patient was introduced to multiple new interventions for improved lumbar stability and strength with moderate difficulty and fatigue. He required minimal cueing with today's new interventions for proper exercise performance to facilitate lumbar engagement. He reported that his back felt better upon the conclusion of treatment. He continues to require skilled physical therapy to address his remaining impairments to return to his prior level of function.    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/Moderate complexity    Rehab Potential Good    PT Frequency 2x / week    PT Duration 4 weeks    PT Treatment/Interventions ADLs/Self Care Home Management;Cryotherapy;Electrical Stimulation;Moist Heat;Traction;Neuromuscular re-education;Therapeutic exercise;Therapeutic activities;Functional mobility training;Stair training;Patient/family education;Manual techniques;Dry needling;Taping    PT Next Visit Plan  nustep, core stability progression, lower extremity strengthening, and modalities as needed    PT Home Exercise Plan Access Code: [email or web address]  URL: [email or web address]  Date: 07/23/2021  Prepared by: Jacqulynn Cadet    Exercises  - Supine  Active Straight Leg Raise  - 1 x daily - 7 x weekly - 2 sets - 10 reps  - Supine Hip and Knee Flexion AROM with Swiss Ball  - 1 x daily - 7 x weekly - 3 sets - 10 reps  - Supine Piriformis Stretch with Foot on Ground  - 1 x daily - 7 x weekly - 3 sets - 30 seconds  hold  - Seated Thoracic Flexion and Rotation with Swiss Ball  - 1 x daily - 7 x weekly - 3 sets - 10 reps  - Seated Abdominal Press into [employer]  - 1 x daily - 7 x weekly - 2 sets - 10 reps - 5 seconds hold    Consulted and Agree with Plan of Care Patient             Patient will benefit from skilled therapeutic intervention in order to improve the following deficits and impairments:  Decreased range of motion, Difficulty walking, Decreased activity tolerance, Pain, Decreased strength, Decreased mobility  Visit Diagnosis: Other low back pain  Muscle weakness (generalized)     Problem List Patient Active Problem List   Diagnosis Date Noted   Low testosterone 06/26/2021   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   BPH without obstruction/lower urinary tract symptoms 06/26/2021   Severe obstructive sleep apnea-hypopnea syndrome 10/12/2018   OA (osteoarthritis) of knee 03/04/2016   Acute medial meniscal tear 03/06/2014   Cold hands and feet 08/23/2012   Rationale for Evaluation and Treatment Rehabilitation   Darlin Coco, PT 07/23/2021, 3:51 PM  [facility] [place], [place], [address] Phone: [phone]   Fax:  [phone]  Name: Thomas Pham MRN: [id number] Date of Birth: 27-Jul-1955

## 2021-07-24 ENCOUNTER — Telehealth: Payer: Self-pay

## 2021-07-24 NOTE — Telephone Encounter (Signed)
Fax received from [facility] needing an appeal preparation checklist filled out and faxed back to them.

## 2021-07-24 NOTE — Telephone Encounter (Signed)
[employer] appeal preparation checklist filled out and scanned back to [facility] through the portal.

## 2021-07-24 NOTE — Telephone Encounter (Signed)
Opened in error

## 2021-07-25 ENCOUNTER — Ambulatory Visit: Payer: BC Managed Care – PPO

## 2021-07-25 DIAGNOSIS — M6281 Muscle weakness (generalized): Secondary | ICD-10-CM

## 2021-07-25 DIAGNOSIS — M5459 Other low back pain: Secondary | ICD-10-CM | POA: Diagnosis not present

## 2021-07-25 NOTE — Therapy (Signed)
[facility] [place], [place], [address] Phone: [phone]   Fax:  [phone]  Physical Therapy Treatment  Patient Details  Name: Thomas Pham MRN: [id number] Date of Birth: 1955/08/08 Referring Provider (PT): Shearon Stalls, [place]   Encounter Date: 07/25/2021   PT End of Session - 07/25/21 0937     Visit Number 7    Number of Visits 8    Date for PT Re-Evaluation 08/15/21    PT Start Time 0924    PT Stop Time 2330    PT Time Calculation (min) 71 min    Activity Tolerance Patient tolerated treatment well    Behavior During Therapy [facility] for tasks assessed/performed             Past Medical History:  Diagnosis Date   Allergy    Arthritis    osteo   Basal cell carcinoma 09/13/2014   BCC NODULAR LEFT CHEEK PER ST CLEAR   Basal cell carcinoma (BCC) 09/30/2020   SUP & NOD - R UPPER ABDOMEN (CX3FU)   Basal cell carcinoma of skin 09/14/2019   right malar cheek -MOHS   BCC (basal cell carcinoma of skin) 09/09/2017   BCC MICRONODULAR LEFT CHEEK CX3 AND EXC   BCC (basal cell carcinoma of skin) 09/09/2017   BCC POSITIVE MARGIN MOHS DR PEARCE   Chronic kidney disease    Difficulty sleeping    GERD (gastroesophageal reflux disease)    takes nexium for acid reflux as needed   History of gout    History of hiatal hernia    umbilical hernia   History of kidney stones    Hyperlipidemia    Hypertension    Hypothyroidism    Low back pain    Medial meniscus tear    LEFT   Organic impotence    Penile cyst    Sleep apnea    not using mask d/t preference   Thyroid disease     Past Surgical History:  Procedure Laterality Date   KNEE ARTHROSCOPY Left 03/07/2014   Procedure: LEFT ARTHROSCOPY KNEE WITH MEDIAL MENISCECTOMY  DEBRIDEMENT AND CHONDROPLASTY ;  Surgeon: Gearlean Alf, MD;  Location: [facility];  Service: Orthopedics;  Laterality: Left;   THYROIDECTOMY Right 2013   TONSILLECTOMY     childhood   TOTAL KNEE  ARTHROPLASTY Left 03/04/2016   Procedure: LEFT TOTAL KNEE ARTHROPLASTY;  Surgeon: Gaynelle Arabian, MD;  Location: [facility];  Service: Orthopedics;  Laterality: Left;    There were no vitals filed for this visit.   Subjective Assessment - 07/25/21 0924     Subjective Patient reports that his back felt the best it has in a while after his last treatment. However, his pain is still waking him up at night causing it to be difficult to get comfortable again.    Pertinent History OA    Limitations Walking;Standing;Lifting;House hold activities    How long can you stand comfortably? varies    How long can you walk comfortably? cannot walk comfortably    Patient Stated Goals get his legs stronger, reduced pain, stand up to 20-30 minutes    Currently in Pain? Yes    Pain Score 4     Pain Location Back    Pain Onset More than a month ago                               [facility] Adult PT Treatment/Exercise -  07/25/21 0001       Exercises   Exercises Knee/Hip      Lumbar Exercises: Stretches   Double Knee to Chest Stretch Other (comment)   30 reps; LE supported on red ball   Lower Trunk Rotation Other (comment)   20 reps; with LE supported on red ball   Other Lumbar Stretch Exercise Ball roll out   multidirectional; 10 reps each     Lumbar Exercises: Aerobic   Nustep L8 x 10 minutes      Lumbar Exercises: Machines for Strengthening   Cybex Knee Extension 30# x 20 reps    Cybex Knee Flexion 30# x 10 reps; 40# x 10 reps; 50# x 10 reps      Lumbar Exercises: Standing   Shoulder Extension Both;20 reps;5 reps    Shoulder Extension Limitations Blue XTS    Other Standing Lumbar Exercises Ball press   5 second hold; 20 reps   Other Standing Lumbar Exercises Wood chops and multifidus press out with shoulder flexion   20 reps each; Blue XTS     Lumbar Exercises: Supine   Bridge 20 reps      Lumbar Exercises: Sidelying   Hip Abduction Both;20 reps      Knee/Hip Exercises:  Standing   Hip Extension Both;20 reps;Knee straight    Extension Limitations green t-band at ankle    Lateral Step Up Both;20 reps;Hand Hold: 2;Step Height: 6"    Step Down Left;15 reps;Hand Hold: 2;Step Height: 6"                 Balance Exercises - 07/25/21 0001       Balance Exercises: Standing   Tandem Stance Eyes open;Foam/compliant surface;Intermittent upper extremity support;4 reps;30 secs    Marching Foam/compliant surface;Static;20 reps   BUE support progressing to no UE support; 20 reps each               PT Education - 07/25/21 1046     Education Details HEP, progress with therapy, healing    Person(s) Educated Patient    Methods Explanation    Comprehension Verbalized understanding                 PT Long Term Goals - 07/17/21 0955       PT LONG TERM GOAL #1   Title Patient will be independent with his HEP    Time 4    Period Weeks    Status On-going    Target Date 07/30/21      PT LONG TERM GOAL #2   Title Patient will be able to ascend at least 4 step with a reciprical pattern without being limited by his low back pain.    Time 4    Period Weeks    Status On-going    Target Date 07/30/21      PT LONG TERM GOAL #3   Title Patient will be able to complete his daily activities without his familair low back pain exceeding 4/10.    Time 4    Period Weeks    Status On-going    Target Date 07/30/21                   Plan - 07/25/21 9892     Clinical Impression Statement Patient was introduced to multiple new interventions for improved lumbar and lower extremity strength and stability. He required minimal cueing with today's new interventions for proper biomechanics to prevent him from compensating with surrounding  musculature. For example, he required cueing with standing hip extension to prevent lumbar flexion to isolate gluteal engagement. He reported no pain or discomfort with any of today's interventions. He reported that  he was not feeling any of his familiar back pain upon the conclusion of treatment. He continues to require skilled physical therapy to address his remaining impairments to return to his prior level of function.    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/Moderate complexity    Rehab Potential Good    PT Frequency 2x / week    PT Duration 4 weeks    PT Treatment/Interventions ADLs/Self Care Home Management;Cryotherapy;Electrical Stimulation;Moist Heat;Traction;Neuromuscular re-education;Therapeutic exercise;Therapeutic activities;Functional mobility training;Stair training;Patient/family education;Manual techniques;Dry needling;Taping    PT Next Visit Plan nustep, core stability progression, lower extremity strengthening, and modalities as needed    PT Home Exercise Plan Access Code: [email or web address]  URL: [email or web address]  Date: 07/25/2021  Prepared by: Jacqulynn Cadet    Exercises  - Sidelying Hip Abduction  - 1 x daily - 7 x weekly - 2 sets - 10 reps  - Standing Hip Extension with Counter Support  - 1 x daily - 7 x weekly - 2 sets - 10 reps    Consulted and Agree with Plan of Care Patient             Patient will benefit from skilled therapeutic intervention in order to improve the following deficits and impairments:  Decreased range of motion, Difficulty walking, Decreased activity tolerance, Pain, Decreased strength, Decreased mobility  Visit Diagnosis: Other low back pain  Muscle weakness (generalized)     Problem List Patient Active Problem List   Diagnosis Date Noted   Low testosterone 06/26/2021   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   BPH without obstruction/lower urinary tract symptoms 06/26/2021    Severe obstructive sleep apnea-hypopnea syndrome 10/12/2018   OA (osteoarthritis) of knee 03/04/2016   Acute medial meniscal tear 03/06/2014   Cold hands and feet 08/23/2012   Rationale for Evaluation and Treatment Rehabilitation   Darlin Coco, PT 07/25/2021, 10:48 AM  [facility] [place], [place], [address] Phone: [phone]   Fax:  [phone]  Name: Thomas Pham MRN: [id number] Date of Birth: [birth year]

## 2021-07-28 NOTE — Telephone Encounter (Signed)
[employer] quick start program approved [facility] PATIENT   [facility]way  [phone] Fax [phone]

## 2021-07-30 ENCOUNTER — Ambulatory Visit: Payer: BC Managed Care – PPO

## 2021-07-30 DIAGNOSIS — M6281 Muscle weakness (generalized): Secondary | ICD-10-CM

## 2021-07-30 DIAGNOSIS — M5459 Other low back pain: Secondary | ICD-10-CM | POA: Diagnosis not present

## 2021-07-30 NOTE — Therapy (Signed)
[facility] [place], [place], [address] Phone: [phone]   Fax:  [phone]  Physical Therapy Treatment  Patient Details  Name: Thomas Pham MRN: [id number] Date of Birth: 25-Feb-1955 Referring Provider (PT): Shearon Stalls, [place]   Encounter Date: 07/30/2021   PT End of Session - 07/30/21 0951     Visit Number 8    Number of Visits 12    Date for PT Re-Evaluation 09/12/21    PT Start Time 0946    PT Stop Time 1037    PT Time Calculation (min) 51 min    Activity Tolerance Patient tolerated treatment well    Behavior During Therapy [facility] for tasks assessed/performed             Past Medical History:  Diagnosis Date   Allergy    Arthritis    osteo   Basal cell carcinoma 09/13/2014   BCC NODULAR LEFT CHEEK PER ST CLEAR   Basal cell carcinoma (BCC) 09/30/2020   SUP & NOD - R UPPER ABDOMEN (CX3FU)   Basal cell carcinoma of skin 09/14/2019   right malar cheek -MOHS   BCC (basal cell carcinoma of skin) 09/09/2017   BCC MICRONODULAR LEFT CHEEK CX3 AND EXC   BCC (basal cell carcinoma of skin) 09/09/2017   BCC POSITIVE MARGIN MOHS DR PEARCE   Chronic kidney disease    Difficulty sleeping    GERD (gastroesophageal reflux disease)    takes nexium for acid reflux as needed   History of gout    History of hiatal hernia    umbilical hernia   History of kidney stones    Hyperlipidemia    Hypertension    Hypothyroidism    Low back pain    Medial meniscus tear    LEFT   Organic impotence    Penile cyst    Sleep apnea    not using mask d/t preference   Thyroid disease     Past Surgical History:  Procedure Laterality Date   KNEE ARTHROSCOPY Left 03/07/2014   Procedure: LEFT ARTHROSCOPY KNEE WITH MEDIAL MENISCECTOMY  DEBRIDEMENT AND CHONDROPLASTY ;  Surgeon: Gearlean Alf, MD;  Location: [facility];  Service: Orthopedics;  Laterality: Left;   THYROIDECTOMY Right 2013   TONSILLECTOMY     childhood   TOTAL KNEE  ARTHROPLASTY Left 03/04/2016   Procedure: LEFT TOTAL KNEE ARTHROPLASTY;  Surgeon: Gaynelle Arabian, MD;  Location: [facility];  Service: Orthopedics;  Laterality: Left;    There were no vitals filed for this visit.   Subjective Assessment - 07/30/21 0950     Subjective Patient reports that his back felt great for about 1-2 days after his last appointment. However, his back was bothering him last night. He has not noticed a rhyme or reason for his pain. He feels that his pain is getting better than it has been, but it will still wake him up at night.    Pertinent History OA    Limitations Walking;Standing;Lifting;House hold activities    How long can you stand comfortably? varies    How long can you walk comfortably? cannot walk comfortably    Patient Stated Goals get his legs stronger, reduced pain, stand up to 20-30 minutes    Currently in Pain? Yes    Pain Score 4     Pain Location Back    Pain Orientation Lower    Pain Onset More than a month ago  [facility] PT Assessment - 07/30/21 0001       Observation/Other Assessments   Focus on Therapeutic Outcomes (FOTO)  61.05      AROM   Lumbar Flexion 52    Lumbar Extension 24                           [facility] Adult PT Treatment/Exercise - 07/30/21 0001       Lumbar Exercises: Stretches   Double Knee to Chest Stretch Other (comment)   30 reps   Other Lumbar Stretch Exercise Ball roll out   multidirectional; 20 reps each     Lumbar Exercises: Aerobic   Nustep L8 x 10 minutes      Lumbar Exercises: Machines for Strengthening   Cybex Lumbar Extension 80# x 20 reps    Cybex Knee Extension 30# x 30 reps    Cybex Knee Flexion 50# x 30 reps      Lumbar Exercises: Standing   Shoulder Extension Both;20 reps    Shoulder Extension Limitations Blue XTS with alternating marching    Other Standing Lumbar Exercises Wood chops   20 reps each; blue XTS     Knee/Hip Exercises: Standing   Wall Squat 20 reps   with  red ball behind back for upright posture                         PT Long Term Goals - 07/30/21 0959       PT LONG TERM GOAL #1   Title Patient will be independent with his HEP    Time 4    Period Weeks    Status On-going    Target Date 07/30/21      PT LONG TERM GOAL #2   Title Patient will be able to ascend at least 4 step with a reciprical pattern without being limited by his low back pain.    Baseline still going on step at a time    Time 4    Period Weeks    Status On-going    Target Date 07/30/21      PT LONG TERM GOAL #3   Title Patient will be able to complete his daily activities without his familair low back pain exceeding 4/10.    Baseline does not hurt with activities, but begins afterward    Time 4    Period Weeks    Status Partially Met    Target Date 07/30/21                   Plan - 07/30/21 6712     Clinical Impression Statement Patient is making good progress with skilled physical therapy since he experienced a flare up in his familiar symptoms after his chopping word after his initial evaluation. This is evidenced by his progress toward his goals. He was progressed with resisted lumbar extension in addition to familiar interventions with moderate difficulty. He required minimal cueing with wall squats for upright stance to promote lumbar stability. He reported that his back felt good upon the conclusion of treatment. He continues to require skilled physical therapy to address his remaining impairments to return to his prior level of function.    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/Moderate complexity  Rehab Potential Good    PT Frequency 2x / week    PT  Duration 2 weeks    PT Treatment/Interventions ADLs/Self Care Home Management;Cryotherapy;Electrical Stimulation;Moist Heat;Traction;Neuromuscular re-education;Therapeutic exercise;Therapeutic activities;Functional mobility training;Stair training;Patient/family education;Manual techniques;Dry needling;Taping    PT Next Visit Plan nustep, core stability progression, lower extremity strengthening, and modalities as needed    PT Home Exercise Plan Access Code: [email or web address]  URL: [email or web address]  Date: 07/25/2021  Prepared by: Jacqulynn Cadet    Exercises  - Sidelying Hip Abduction  - 1 x daily - 7 x weekly - 2 sets - 10 reps  - Standing Hip Extension with Counter Support  - 1 x daily - 7 x weekly - 2 sets - 10 reps    Consulted and Agree with Plan of Care Patient             Patient will benefit from skilled therapeutic intervention in order to improve the following deficits and impairments:  Decreased range of motion, Difficulty walking, Decreased activity tolerance, Pain, Decreased strength, Decreased mobility  Visit Diagnosis: Other low back pain  Muscle weakness (generalized)     Problem List Patient Active Problem List   Diagnosis Date Noted   Low testosterone 06/26/2021   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   BPH without obstruction/lower urinary tract symptoms 06/26/2021   Severe obstructive sleep apnea-hypopnea syndrome 10/12/2018   OA (osteoarthritis) of knee 03/04/2016   Acute medial meniscal tear 03/06/2014   Cold hands and feet 08/23/2012   Rationale for Evaluation and Treatment Rehabilitation   Darlin Coco, PT 07/30/2021, 10:50 AM  [facility] [place], [place], [address] Phone: [phone]   Fax:  [phone]  Name: Thomas Pham MRN: [id number] Date of Birth: November 18, 1955  Progress Note Reporting Period 07/02/21 to 07/30/21  See note below for Objective Data and Assessment of Progress/Goals.    See clinical impression statement

## 2021-08-05 ENCOUNTER — Encounter: Payer: Self-pay | Admitting: Urology

## 2021-08-05 ENCOUNTER — Encounter: Payer: Self-pay | Admitting: Dermatology

## 2021-08-05 ENCOUNTER — Telehealth: Payer: Self-pay | Admitting: Dermatology

## 2021-08-05 ENCOUNTER — Ambulatory Visit: Payer: BC Managed Care – PPO

## 2021-08-05 DIAGNOSIS — M6281 Muscle weakness (generalized): Secondary | ICD-10-CM

## 2021-08-05 DIAGNOSIS — M5459 Other low back pain: Secondary | ICD-10-CM | POA: Diagnosis not present

## 2021-08-05 NOTE — Telephone Encounter (Signed)
Returning call about denial. Thomas Pham he will come here to sign HIPPA either tomorrow or this afternoon. Said not necessary to call him back. (But if you do, do it this afternoon.)

## 2021-08-05 NOTE — Therapy (Signed)
[facility] [place], [place], [address] Phone: [phone]   Fax:  [phone]  Physical Therapy Treatment  Patient Details  Name: Thomas Pham MRN: [id number] Date of Birth: 01-13-1956 Referring Provider (PT): Shearon Stalls, [place]   Encounter Date: 08/05/2021   PT End of Session - 08/05/21 1002     Visit Number 9    Number of Visits 12    Date for PT Re-Evaluation 09/12/21    PT Start Time 0945    PT Stop Time 1048    PT Time Calculation (min) 63 min    Activity Tolerance Patient tolerated treatment well    Behavior During Therapy [facility] for tasks assessed/performed             Past Medical History:  Diagnosis Date   Allergy    Arthritis    osteo   Basal cell carcinoma 09/13/2014   BCC NODULAR LEFT CHEEK PER ST CLEAR   Basal cell carcinoma (BCC) 09/30/2020   SUP & NOD - R UPPER ABDOMEN (CX3FU)   Basal cell carcinoma of skin 09/14/2019   right malar cheek -MOHS   BCC (basal cell carcinoma of skin) 09/09/2017   BCC MICRONODULAR LEFT CHEEK CX3 AND EXC   BCC (basal cell carcinoma of skin) 09/09/2017   BCC POSITIVE MARGIN MOHS DR PEARCE   Chronic kidney disease    Difficulty sleeping    GERD (gastroesophageal reflux disease)    takes nexium for acid reflux as needed   History of gout    History of hiatal hernia    umbilical hernia   History of kidney stones    Hyperlipidemia    Hypertension    Hypothyroidism    Low back pain    Medial meniscus tear    LEFT   Organic impotence    Penile cyst    Sleep apnea    not using mask d/t preference   Thyroid disease     Past Surgical History:  Procedure Laterality Date   KNEE ARTHROSCOPY Left 03/07/2014   Procedure: LEFT ARTHROSCOPY KNEE WITH MEDIAL MENISCECTOMY  DEBRIDEMENT AND CHONDROPLASTY ;  Surgeon: Gearlean Alf, MD;  Location: [facility];  Service: Orthopedics;  Laterality: Left;   THYROIDECTOMY Right 2013   TONSILLECTOMY     childhood   TOTAL KNEE  ARTHROPLASTY Left 03/04/2016   Procedure: LEFT TOTAL KNEE ARTHROPLASTY;  Surgeon: Gaynelle Arabian, MD;  Location: [facility];  Service: Orthopedics;  Laterality: Left;    There were no vitals filed for this visit.   Subjective Assessment - 08/05/21 1000     Subjective Patient reports that he felt great for about 2 days after his last appointment. However, it has been a little more uncomfortable the past two days. He notes that he was able to do a lot of yardwork after his last appointment and he felt "fantastic."    Pertinent History OA    Limitations Walking;Standing;Lifting;House hold activities    How long can you stand comfortably? varies    How long can you walk comfortably? cannot walk comfortably    Patient Stated Goals get his legs stronger, reduced pain, stand up to 20-30 minutes    Currently in Pain? Yes    Pain Score 3     Pain Location Back    Pain Onset More than a month ago  [facility] Adult PT Treatment/Exercise - 08/05/21 0001       Lumbar Exercises: Stretches   Double Knee to Chest Stretch Other (comment)   30 reps   Lower Trunk Rotation Other (comment)      Lumbar Exercises: Aerobic   Nustep L8 x 10 minutes      Lumbar Exercises: Machines for Strengthening   Cybex Knee Extension 30# x 30 reps    Cybex Knee Flexion 60# x 30 reps      Lumbar Exercises: Supine   Bridge 20 reps;10 reps      Knee/Hip Exercises: Supine   Straight Leg Raises Both;2 sets;15 reps      Modalities   Modalities Electrical Stimulation      Electrical Stimulation   Electrical Stimulation Location right gluteal    Electrical Stimulation Action pre mod    Electrical Stimulation Parameters 80-150 Hz x 10 minutes    Electrical Stimulation Goals Pain                          PT Long Term Goals - 07/30/21 0959       PT LONG TERM GOAL #1   Title Patient will be independent with his HEP    Time 4    Period Weeks    Status  On-going    Target Date 07/30/21      PT LONG TERM GOAL #2   Title Patient will be able to ascend at least 4 step with a reciprical pattern without being limited by his low back pain.    Baseline still going on step at a time    Time 4    Period Weeks    Status On-going    Target Date 07/30/21      PT LONG TERM GOAL #3   Title Patient will be able to complete his daily activities without his familair low back pain exceeding 4/10.    Baseline does not hurt with activities, but begins afterward    Time 4    Period Weeks    Status Partially Met    Target Date 07/30/21                   Plan - 08/05/21 1009     Clinical Impression Statement Patient presented to treatment with slightly increased irritability as evidenced by his slight increase in low back pain with weighted knee flexion and extension. Treatment focused on familiar interventions for improved lumbar and lower extremity strength. He required minimal cueing with today's interventions for proper pacing to avoid aggravating his familiar symptoms. He reported that his back felt a little better upon the conclusion of treatment. He continues to require skilled physical therapy to address his remaining impairments to return to his prior level of function.    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/Moderate complexity    Rehab Potential Good    PT Frequency 2x / week    PT Duration 2 weeks    PT Treatment/Interventions ADLs/Self Care Home Management;Cryotherapy;Electrical Stimulation;Moist Heat;Traction;Neuromuscular re-education;Therapeutic exercise;Therapeutic activities;Functional mobility training;Stair training;Patient/family education;Manual  techniques;Dry needling;Taping    PT Next Visit Plan nustep, step ups (foward and lateral), lower extremity strengthening, and modalities as needed    PT Home Exercise Plan Access Code: [email or web address]  URL: [email or web address]  Date: 07/25/2021  Prepared by: Jacqulynn Cadet    Exercises  - Sidelying Hip Abduction  - 1 x daily - 7 x weekly - 2 sets - 10 reps  - Standing Hip Extension with Counter Support  - 1 x daily - 7 x weekly - 2 sets - 10 reps    Consulted and Agree with Plan of Care Patient             Patient will benefit from skilled therapeutic intervention in order to improve the following deficits and impairments:  Decreased range of motion, Difficulty walking, Decreased activity tolerance, Pain, Decreased strength, Decreased mobility  Visit Diagnosis: Other low back pain  Muscle weakness (generalized)     Problem List Patient Active Problem List   Diagnosis Date Noted   Low testosterone 06/26/2021   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   BPH without obstruction/lower urinary tract symptoms 06/26/2021   Severe obstructive sleep apnea-hypopnea syndrome 10/12/2018   OA (osteoarthritis) of knee 03/04/2016   Acute medial meniscal tear 03/06/2014   Cold hands and feet 08/23/2012   Rationale for Evaluation and Treatment Rehabilitation   Darlin Coco, PT 08/05/2021, 12:27 PM  [facility] [place], [place], [address] Phone: [phone]   Fax:  [phone]  Name: Thomas Pham MRN: [id number] Date of Birth: 1955-06-22

## 2021-08-05 NOTE — Telephone Encounter (Signed)
[facility] is calling to check on the Prior Authorization for Thomas Pham for patient.

## 2021-08-07 ENCOUNTER — Ambulatory Visit: Payer: BC Managed Care – PPO

## 2021-08-07 DIAGNOSIS — M5459 Other low back pain: Secondary | ICD-10-CM

## 2021-08-07 DIAGNOSIS — M6281 Muscle weakness (generalized): Secondary | ICD-10-CM

## 2021-08-07 NOTE — Therapy (Signed)
[facility] [address] [place], [place], [address] Phone: [phone]   Fax:  [phone]  Physical Therapy Treatment  Patient Details  Name: Thomas Pham MRN: [id number] Date of Birth: 10/09/1955 Referring Provider (PT): Celine Mans, [place]   Encounter Date: 08/07/2021   PT End of Session - 08/07/21 0944     Visit Number 10    Number of Visits 12    Date for PT Re-Evaluation 09/12/21    PT Start Time 0939    PT Stop Time 1027    PT Time Calculation (min) 48 min    Activity Tolerance Patient tolerated treatment well    Behavior During Therapy [facility] for tasks assessed/performed             Past Medical History:  Diagnosis Date   Allergy    Arthritis    osteo   Basal cell carcinoma 09/13/2014   BCC NODULAR LEFT CHEEK PER ST CLEAR   Basal cell carcinoma (BCC) 09/30/2020   SUP & NOD - R UPPER ABDOMEN (CX3FU)   Basal cell carcinoma of skin 09/14/2019   right malar cheek -MOHS   BCC (basal cell carcinoma of skin) 09/09/2017   BCC MICRONODULAR LEFT CHEEK CX3 AND EXC   BCC (basal cell carcinoma of skin) 09/09/2017   BCC POSITIVE MARGIN MOHS DR PEARCE   Chronic kidney disease    Difficulty sleeping    GERD (gastroesophageal reflux disease)    takes nexium for acid reflux as needed   History of gout    History of hiatal hernia    umbilical hernia   History of kidney stones    Hyperlipidemia    Hypertension    Hypothyroidism    Low back pain    Medial meniscus tear    LEFT   Organic impotence    Penile cyst    Sleep apnea    not using mask d/t preference   Thyroid disease     Past Surgical History:  Procedure Laterality Date   KNEE ARTHROSCOPY Left 03/07/2014   Procedure: LEFT ARTHROSCOPY KNEE WITH MEDIAL MENISCECTOMY  DEBRIDEMENT AND CHONDROPLASTY ;  Surgeon: Loanne Drilling, MD;  Location: [facility];  Service: Orthopedics;  Laterality: Left;   THYROIDECTOMY Right 2013   TONSILLECTOMY     childhood   TOTAL KNEE  ARTHROPLASTY Left 03/04/2016   Procedure: LEFT TOTAL KNEE ARTHROPLASTY;  Surgeon: Ollen Gross, MD;  Location: [facility];  Service: Orthopedics;  Laterality: Left;    There were no vitals filed for this visit.   Subjective Assessment - 08/07/21 0943     Subjective Patient reports that his back felt better after his last appointment. He notes that the right side of his low back is hurting a little today.    Pertinent History OA    Limitations Walking;Standing;Lifting;House hold activities    How long can you stand comfortably? varies    How long can you walk comfortably? cannot walk comfortably    Patient Stated Goals get his legs stronger, reduced pain, stand up to 20-30 minutes    Currently in Pain? Yes    Pain Score 3     Pain Location Back    Pain Orientation Right    Pain Onset More than a month ago                               [facility] Adult PT Treatment/Exercise - 08/07/21 0001  Lumbar Exercises: Stretches   Double Knee to Chest Stretch Other (comment)   30 reps; LE supported on red ball   Lower Trunk Rotation Other (comment)   30 reps; LE supported on red ball     Lumbar Exercises: Aerobic   Nustep L6-8 x 10 minutes      Lumbar Exercises: Supine   Bridge Compliant;20 reps;10 reps   BOSU (ball up) under feet   Straight Leg Raise 20 reps;Other (comment)   with hip abduction: BLE     Lumbar Exercises: Sidelying   Hip Abduction Both;20 reps      Knee/Hip Exercises: Standing   Lateral Step Up Both;20 reps;Hand Hold: 2;Step Height: 8"    Forward Step Up Right;20 reps;Hand Hold: 2;Step Height: 8"                          PT Long Term Goals - 07/30/21 0959       PT LONG TERM GOAL #1   Title Patient will be independent with his HEP    Time 4    Period Weeks    Status On-going    Target Date 07/30/21      PT LONG TERM GOAL #2   Title Patient will be able to ascend at least 4 step with a reciprical pattern without being limited  by his low back pain.    Baseline still going on step at a time    Time 4    Period Weeks    Status On-going    Target Date 07/30/21      PT LONG TERM GOAL #3   Title Patient will be able to complete his daily activities without his familair low back pain exceeding 4/10.    Baseline does not hurt with activities, but begins afterward    Time 4    Period Weeks    Status Partially Met    Target Date 07/30/21                   Plan - 08/07/21 1001     Clinical Impression Statement Patient was progressed with familiar interventions for impronved lower extremity strength needed for functional activities. He required minmal cueing with today's interventions for proper biomechanics. He reported no significant increase in pain or discomfort with any of today's interventions. He reported that his back felt better upon the conclusion of treatment. He continues to require skilled physical therapy to address his remaining impairments to return to his prior level of function.    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/Moderate complexity    Rehab Potential Good    PT Frequency 2x / week    PT Duration 2 weeks    PT Treatment/Interventions ADLs/Self Care Home Management;Cryotherapy;Electrical Stimulation;Moist Heat;Traction;Neuromuscular re-education;Therapeutic exercise;Therapeutic activities;Functional mobility training;Stair training;Patient/family education;Manual techniques;Dry needling;Taping    PT Next Visit Plan nustep, step ups (foward and lateral), lower extremity strengthening, and modalities as needed    PT Home Exercise Plan Access Code: [email or web address]  URL: [email or web address]  Date: 07/25/2021   Prepared by: Jacqulynn Cadet    Exercises  - Sidelying Hip Abduction  - 1 x daily - 7 x weekly - 2 sets - 10 reps  - Standing Hip Extension with Counter Support  - 1 x daily - 7  x weekly - 2 sets - 10 reps    Consulted and Agree with Plan of Care Patient             Patient will benefit from skilled therapeutic intervention in order to improve the following deficits and impairments:  Decreased range of motion, Difficulty walking, Decreased activity tolerance, Pain, Decreased strength, Decreased mobility  Visit Diagnosis: Other low back pain  Muscle weakness (generalized)     Problem List Patient Active Problem List   Diagnosis Date Noted   Low testosterone 06/26/2021   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   BPH without obstruction/lower urinary tract symptoms 06/26/2021   Severe obstructive sleep apnea-hypopnea syndrome 10/12/2018   OA (osteoarthritis) of knee 03/04/2016   Acute medial meniscal tear 03/06/2014   Cold hands and feet 08/23/2012   Rationale for Evaluation and Treatment Rehabilitation   Darlin Coco, PT 08/07/2021, 5:14 PM  [facility] [address] [place], [place], [address] Phone: [phone]   Fax:  [phone]  Name: Thomas Pham MRN: [id number] Date of Birth: 1955/05/09

## 2021-08-12 ENCOUNTER — Ambulatory Visit: Payer: BC Managed Care – PPO

## 2021-08-12 DIAGNOSIS — M5459 Other low back pain: Secondary | ICD-10-CM

## 2021-08-12 DIAGNOSIS — M6281 Muscle weakness (generalized): Secondary | ICD-10-CM

## 2021-08-21 ENCOUNTER — Ambulatory Visit: Payer: BC Managed Care – PPO | Attending: Neurosurgery

## 2021-08-21 DIAGNOSIS — M6281 Muscle weakness (generalized): Secondary | ICD-10-CM | POA: Diagnosis present

## 2021-08-21 DIAGNOSIS — M5459 Other low back pain: Secondary | ICD-10-CM | POA: Insufficient documentation

## 2021-08-21 NOTE — Therapy (Addendum)
OUTPATIENT PHYSICAL THERAPY TREATMENT NOTE   Patient Name: Thomas Pham MRN: [id number] DOB:07-29-1955, 66 y.o., male Today's Date: 08/21/2021  PCP: Heywood Bene, PA-C  REFERRING PROVIDER: Chase Caller, PA-C   PT End of Session - 08/21/21 1033     Visit Number 12    Number of Visits 12    Date for PT Re-Evaluation 09/12/21    PT Start Time 1030    PT Stop Time 1113    PT Time Calculation (min) 43 min    Activity Tolerance Patient tolerated treatment well    Behavior During Therapy [facility] for tasks assessed/performed             Past Medical History:  Diagnosis Date   Allergy    Arthritis    osteo   Basal cell carcinoma 09/13/2014   BCC NODULAR LEFT CHEEK PER ST CLEAR   Basal cell carcinoma (BCC) 09/30/2020   SUP & NOD - R UPPER ABDOMEN (CX3FU)   Basal cell carcinoma of skin 09/14/2019   right malar cheek -MOHS   BCC (basal cell carcinoma of skin) 09/09/2017   BCC MICRONODULAR LEFT CHEEK CX3 AND EXC   BCC (basal cell carcinoma of skin) 09/09/2017   BCC POSITIVE MARGIN MOHS DR PEARCE   Chronic kidney disease    Difficulty sleeping    GERD (gastroesophageal reflux disease)    takes nexium for acid reflux as needed   History of gout    History of hiatal hernia    umbilical hernia   History of kidney stones    Hyperlipidemia    Hypertension    Hypothyroidism    Low back pain    Medial meniscus tear    LEFT   Organic impotence    Penile cyst    Sleep apnea    not using mask d/t preference   Thyroid disease    Past Surgical History:  Procedure Laterality Date   KNEE ARTHROSCOPY Left 03/07/2014   Procedure: LEFT ARTHROSCOPY KNEE WITH MEDIAL MENISCECTOMY  DEBRIDEMENT AND CHONDROPLASTY ;  Surgeon: Gearlean Alf, MD;  Location: [facility];  Service: Orthopedics;  Laterality: Left;   THYROIDECTOMY Right 2013   TONSILLECTOMY     childhood   TOTAL KNEE ARTHROPLASTY Left 03/04/2016   Procedure: LEFT TOTAL KNEE ARTHROPLASTY;  Surgeon: Gaynelle Arabian, MD;  Location: [facility];  Service: Orthopedics;  Laterality: Left;   Patient Active Problem List   Diagnosis Date Noted   Low testosterone 06/26/2021   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 He notes that he tried doing his spinning bike Monday, but it did not hurt until afterward. He feels that his back is getting better, but he still cannot stand for long periods.   PAIN:  Are you having pain? Yes: NPRS scale: 3.5/10 Pain location: left low back Pain description: discomfort      TODAY'S TREATMENT:  EXERCISE LOG  Exercise Repetitions and Resistance Comments  Nustep  L7 x 10 minutes    Lunges with single arm row BUE and LE; 30 pounds; 15 reps each   Squat with row 30# x 20 minutes    Seated dead lift  10 lbs; 15 reps  Slight low back pain   Weighted knee flexion  Single leg; 40# x 30 reps    Weighted knee extension  Single and BLE; 10-20# x 30 reps each    Blank cell = exercise not performed today    PATIENT EDUCATION: Education details: HEP, safe exercise progression, healing Person educated: Patient Education method: Explanation Education comprehension: verbalized understanding   HOME EXERCISE PROGRAM: [id number]     PT Long Term Goals - 07/30/21 0959       PT LONG TERM GOAL #1   Title Patient will be independent with his HEP    Time 4    Period  Weeks    Status Achieved    Target Date 07/30/21      PT LONG TERM GOAL #2   Title Patient will be able to ascend at least 4 step with a reciprical pattern without being limited by his low back pain.    Baseline    Time 4    Period Weeks    Status Achieved    Target Date 07/30/21      PT LONG TERM GOAL #3   Title Patient will be able to complete his daily activities without his familair low back pain exceeding 4/10.    Baseline does not hurt with activities, but can get up to 6/10   Time 4    Period Weeks    Status Partially Met    Target Date 07/30/21              Plan - 08/21/21 1039     Clinical Impression Statement Patient was introduced to multiple new interventions for improved core stability. He required minimal cueing with today's new interventions for proper exercise performance. His HEP was reviewed and he was educated on how to safely progress these activities. He reported feeling good upon the conclusion of treatment. He will be placed on hold at this time and will be discharged on 09/15/21 unless he experienced a exacerbation in his familiar symptoms prior to that time.    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/Moderate complexity    Rehab Potential Good    PT Frequency 2x / week    PT Duration 2 weeks    PT Treatment/Interventions ADLs/Self Care Home Management;Cryotherapy;Electrical Stimulation;Moist Heat;Traction;Neuromuscular re-education;Therapeutic exercise;Therapeutic activities;Functional mobility training;Stair training;Patient/family education;Manual techniques;Dry needling;Taping    PT Next Visit Plan nustep, step ups (foward and lateral), lower extremity strengthening, and  modalities as needed    PT Home Exercise Plan Access Code: [email or web address]  URL: [email or web address]  Date: 07/25/2021  Prepared by: Jacqulynn Cadet    Exercises  - Sidelying Hip Abduction  - 1 x daily - 7 x weekly - 2 sets - 10 reps  - Standing Hip Extension with Counter Support  - 1 x daily - 7 x weekly - 2 sets - 10 reps    Consulted and Agree with Plan of Care Patient            PHYSICAL THERAPY DISCHARGE SUMMARY  Visits  from Start of Care: 12  Current functional level related to goals / functional outcomes: Patient was able to meet most of his goals for skilled physical therapy.    Remaining deficits: Pain    Education / Equipment: HEP    Patient agrees to discharge. Patient goals were partially met. Patient is being discharged due to being pleased with the current functional level.    Darlin Coco, PT 08/21/2021, 12:55 PM

## 2021-09-16 ENCOUNTER — Telehealth: Payer: Self-pay | Admitting: *Deleted

## 2021-09-16 MED ORDER — DUPIXENT 300 MG/2ML ~~LOC~~ SOAJ: 300.0000 mg | SUBCUTANEOUS | 6 refills | Status: AC

## 2021-09-16 NOTE — Telephone Encounter (Signed)
[place] faxed over refill request for dupixent- sent in refill to pharmacy.

## 2021-10-14 ENCOUNTER — Ambulatory Visit: Payer: BC Managed Care – PPO | Admitting: Dermatology

## 2021-12-16 ENCOUNTER — Ambulatory Visit (INDEPENDENT_AMBULATORY_CARE_PROVIDER_SITE_OTHER): Payer: BC Managed Care – PPO | Admitting: Podiatry

## 2021-12-16 DIAGNOSIS — L600 Ingrowing nail: Secondary | ICD-10-CM

## 2021-12-16 DIAGNOSIS — R52 Pain, unspecified: Secondary | ICD-10-CM

## 2021-12-16 NOTE — Progress Notes (Signed)
   Chief Complaint  Patient presents with   Foot Problem    Ingrown nail on the great toe, right foot     Subjective: Patient presents today for evaluation of pain to the lateral border right great toe. Patient is concerned for possible ingrown nail.  It is very sensitive to touch.  Patient presents today for further treatment and evaluation.  Past Medical History:  Diagnosis Date   Allergy    Arthritis    osteo   Basal cell carcinoma 09/13/2014   BCC NODULAR LEFT CHEEK PER ST CLEAR   Basal cell carcinoma (BCC) 09/30/2020   SUP & NOD - R UPPER ABDOMEN (CX3FU)   Basal cell carcinoma of skin 09/14/2019   right malar cheek -MOHS   BCC (basal cell carcinoma of skin) 09/09/2017   BCC MICRONODULAR LEFT CHEEK CX3 AND EXC   BCC (basal cell carcinoma of skin) 09/09/2017   BCC POSITIVE MARGIN MOHS DR PEARCE   Chronic kidney disease    Difficulty sleeping    GERD (gastroesophageal reflux disease)    takes nexium for acid reflux as needed   History of gout    History of hiatal hernia    umbilical hernia   History of kidney stones    Hyperlipidemia    Hypertension    Hypothyroidism    Low back pain    Medial meniscus tear    LEFT   Organic impotence    Penile cyst    Sleep apnea    not using mask d/t preference   Thyroid disease     Objective:  General: Well developed, nourished, in no acute distress, alert and oriented x3   Dermatology: Skin is warm, dry and supple bilateral.  Lateral border right great toe is tender with evidence of an ingrowing nail. Pain on palpation noted to the border of the nail fold. The remaining nails appear unremarkable at this time. There are no open sores, lesions.  Vascular: DP and PT pulses palpable.  No clinical evidence of vascular compromise  Neruologic: Grossly intact via light touch bilateral.  Musculoskeletal: No pedal deformity noted  Assesement: #1 Paronychia with ingrowing nail lateral border right great toe  Plan of Care:  1.  Patient evaluated.  2. Discussed treatment alternatives and plan of care. Explained nail avulsion procedure and post procedure course to patient. 3. Patient opted for permanent partial nail avulsion of the ingrown portion of the nail.  4. Prior to procedure, local anesthesia infiltration utilized using 3 ml of a 50:50 mixture of 2% plain lidocaine and 0.5% plain marcaine in a normal hallux block fashion and a betadine prep performed.  5. Partial permanent nail avulsion with chemical matrixectomy performed using [id number] applications of phenol followed by alcohol flush.  6. Light dressing applied.  Post care instructions provided 7.  Prescription for gentamicin 2% cream  8.  Return to clinic 2 weeks.  Edrick Kins, DPM [facility]  Dr. Edrick Kins, DPM    [address], [place] [address]                Office [phone]  Fax [phone]

## 2021-12-17 ENCOUNTER — Telehealth: Payer: Self-pay | Admitting: Podiatry

## 2021-12-17 ENCOUNTER — Other Ambulatory Visit: Payer: Self-pay | Admitting: Podiatry

## 2021-12-17 MED ORDER — GENTAMICIN SULFATE 0.1 % EX CREA: 1.0000 | TOPICAL_CREAM | Freq: Two times a day (BID) | CUTANEOUS | 1 refills | Status: DC

## 2021-12-17 NOTE — Telephone Encounter (Signed)
Pt called and states that a cream was suppose to be sent to pharmacy. He checked and its not there.   gentamicin 2% cream   Please send medication to:  [place] [address] [place]

## 2021-12-17 NOTE — Telephone Encounter (Signed)
Gentamicin sent to the pharmacy.  Please notify patient.  Thanks, Dr. Amalia Hailey.  Sorry for the delay

## 2021-12-18 NOTE — Telephone Encounter (Signed)
Patient aware.

## 2021-12-31 ENCOUNTER — Ambulatory Visit (INDEPENDENT_AMBULATORY_CARE_PROVIDER_SITE_OTHER): Payer: BC Managed Care – PPO | Admitting: Podiatry

## 2021-12-31 DIAGNOSIS — L600 Ingrowing nail: Secondary | ICD-10-CM

## 2022-01-05 NOTE — Progress Notes (Signed)
   Chief Complaint  Patient presents with   Follow-up    Patient is here for right foot great toe ingrown follow-up.    Subjective: 66 y.o. male presents today status post permanent nail avulsion procedure of the right great toenail that was performed on 12/16/2021.  Overall patient is doing well.  He has been soaking his foot and applying antibiotic cream as instructed.  No new complaints at this time.   Past Medical History:  Diagnosis Date   Allergy    Arthritis    osteo   Basal cell carcinoma 09/13/2014   BCC NODULAR LEFT CHEEK PER ST CLEAR   Basal cell carcinoma (BCC) 09/30/2020   SUP & NOD - R UPPER ABDOMEN (CX3FU)   Basal cell carcinoma of skin 09/14/2019   right malar cheek -MOHS   BCC (basal cell carcinoma of skin) 09/09/2017   BCC MICRONODULAR LEFT CHEEK CX3 AND EXC   BCC (basal cell carcinoma of skin) 09/09/2017   BCC POSITIVE MARGIN MOHS DR PEARCE   Chronic kidney disease    Difficulty sleeping    GERD (gastroesophageal reflux disease)    takes nexium for acid reflux as needed   History of gout    History of hiatal hernia    umbilical hernia   History of kidney stones    Hyperlipidemia    Hypertension    Hypothyroidism    Low back pain    Medial meniscus tear    LEFT   Organic impotence    Penile cyst    Sleep apnea    not using mask d/t preference   Thyroid disease     Objective: Neurovascular status intact.  Skin is warm, dry and supple. Nail and respective nail fold appears to be healing appropriately.   Assessment: #1 s/p partial permanent nail matrixectomy lateral border right great toe   Plan of care: #1 patient was evaluated  #2 light debridement of the periungual debris was performed to the border of the respective toe and nail plate using a tissue nipper. #3 patient is to return to clinic on a PRN basis.   Edrick Kins, DPM [facility]  Dr. Edrick Kins, DPM    [address], [place] [address]                Office [phone]  Fax [phone]

## 2022-11-24 ENCOUNTER — Other Ambulatory Visit (HOSPITAL_COMMUNITY): Payer: Self-pay

## 2022-11-27 ENCOUNTER — Ambulatory Visit: Payer: BC Managed Care – PPO | Attending: Orthopedic Surgery

## 2022-11-27 DIAGNOSIS — M25561 Pain in right knee: Secondary | ICD-10-CM | POA: Insufficient documentation

## 2022-11-27 DIAGNOSIS — M25661 Stiffness of right knee, not elsewhere classified: Secondary | ICD-10-CM | POA: Insufficient documentation

## 2022-11-27 NOTE — Therapy (Signed)
OUTPATIENT PHYSICAL THERAPY LOWER EXTREMITY EVALUATION   Patient Name: Thomas Pham MRN: [id number] DOB:[birth year], 67 y.o., male Today's Date: 11/27/2022  END OF SESSION:  PT End of Session - 11/27/22 1023     Visit Number 1    Number of Visits 12    Date for PT Re-Evaluation 12/25/22    PT Start Time 1024    PT Stop Time 1102    PT Time Calculation (min) 38 min    Activity Tolerance Patient tolerated treatment well    Behavior During Therapy [facility] for tasks assessed/performed             Past Medical History:  Diagnosis Date   Allergy    Arthritis    osteo   Basal cell carcinoma 09/13/2014   BCC NODULAR LEFT CHEEK PER ST CLEAR   Basal cell carcinoma (BCC) 09/30/2020   SUP & NOD - R UPPER ABDOMEN (CX3FU)   Basal cell carcinoma of skin 09/14/2019   right malar cheek -MOHS   BCC (basal cell carcinoma of skin) 09/09/2017   BCC MICRONODULAR LEFT CHEEK CX3 AND EXC   BCC (basal cell carcinoma of skin) 09/09/2017   BCC POSITIVE MARGIN MOHS DR PEARCE   Chronic kidney disease    Difficulty sleeping    GERD (gastroesophageal reflux disease)    takes nexium for acid reflux as needed   History of gout    History of hiatal hernia    umbilical hernia   History of kidney stones    Hyperlipidemia    Hypertension    Hypothyroidism    Low back pain    Medial meniscus tear    LEFT   Organic impotence    Penile cyst    Sleep apnea    not using mask d/t preference   Thyroid disease    Past Surgical History:  Procedure Laterality Date   KNEE ARTHROSCOPY Left 03/07/2014   Procedure: LEFT ARTHROSCOPY KNEE WITH MEDIAL MENISCECTOMY  DEBRIDEMENT AND CHONDROPLASTY ;  Surgeon: Loanne Drilling, MD;  Location: [facility];  Service: Orthopedics;  Laterality: Left;   THYROIDECTOMY Right 2013   TONSILLECTOMY     childhood   TOTAL KNEE ARTHROPLASTY Left 03/04/2016   Procedure: LEFT TOTAL KNEE ARTHROPLASTY;  Surgeon: Ollen Gross, MD;  Location: [facility];  Service: Orthopedics;   Laterality: Left;   Patient Active Problem List   Diagnosis Date Noted   Low testosterone 06/26/2021   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   BPH without obstruction/lower urinary tract symptoms 06/26/2021   Severe obstructive sleep apnea-hypopnea syndrome 10/12/2018   OA (osteoarthritis) of knee 03/04/2016   Acute medial meniscal tear 03/06/2014   Cold hands and feet 08/23/2012   REFERRING PROVIDER: Ollen Gross, MD   REFERRING DIAG: Unilateral primary osteoarthritis, right knee   THERAPY DIAG:  Stiffness of right knee, not elsewhere classified  Acute pain of right knee  Rationale for Evaluation and Treatment: Rehabilitation  ONSET DATE: 11/25/22  SUBJECTIVE:   SUBJECTIVE STATEMENT: Patient reports that his had a right total knee replacement on 11/25/22. He notes that his knee is primarily stiff right now. He notes that he has been doing his HEP and walking a lot since surgery. He was told to knee his knee wrapped until 11/28/22. He has been using a walker since surgery and has a walker on each level of his house, but he use crutches to get up and down his steps.   PERTINENT HISTORY: Osteoarthritis, allergies, chronic kidney disease, hypertension, and chronic low  back pain PAIN:  Are you having pain? Yes: NPRS scale: 6.5-7/10 Pain location: right knee Pain description: stiffness Aggravating factors: using his knee immobilizers Relieving factors: walking, ice, and medication  PRECAUTIONS: None  RED FLAGS: None   WEIGHT BEARING RESTRICTIONS: No  FALLS:  Has patient fallen in last 6 months? No  LIVING ENVIRONMENT: Lives with: lives with their family Lives in: House/apartment Stairs: Yes: Internal: 2-12 steps; on right going up; "going up with the good and down with the bad"  Has following equipment at home: Dan Humphreys - 4 wheeled  OCCUPATION: TV producer  PLOF: Independent  PATIENT GOALS: be able to play basketball, be able to meet his exercise goals, be able to do yard  work, and improved mobility  NEXT MD VISIT: 12/09/22  OBJECTIVE:  Note: Objective measures were completed at Evaluation unless otherwise noted.  PATIENT SURVEYS:  FOTO 48.07  COGNITION: Overall cognitive status: Within functional limits for tasks assessed     SENSATION: Patient reports no numbness or tingling  EDEMA:  Unable to be assessed due to ace wrap and compression stockings   PALPATION: Mild TTP: right quadriceps, hamstrings, and hip adductors  LOWER EXTREMITY ROM:  Active ROM Right eval Left eval  Hip flexion    Hip extension    Hip abduction    Hip adduction    Hip internal rotation    Hip external rotation    Knee flexion 89/95 (PROM)  130  Knee extension 12 0  Ankle dorsiflexion    Ankle plantarflexion    Ankle inversion    Ankle eversion     (Blank rows = not tested)  LOWER EXTREMITY MMT: not tested due to surgical condition  FUNCTIONAL MOBILITY:  Required modA with sit to supine and supine to sit transfers  GAIT: Assistive device utilized: Environmental consultant - 4 wheeled Level of assistance: Modified independence Comments: step to pattern with decreased stride length and right knee flexed in stance   TODAY'S TREATMENT:                                                                                                                              DATE:   Modalities  Date:  Vaso: Knee, 34 degrees; low pressure, 15 mins, Pain and Edema  PATIENT EDUCATION:  Education details: Plan of care, healing, prognosis, objective findings, and goals for therapy Person educated: Patient Education method: Explanation Education comprehension: verbalized understanding  HOME EXERCISE PROGRAM:   ASSESSMENT:  CLINICAL IMPRESSION: Patient is a 67 y.o. male who was seen today for physical therapy evaluation and treatment following a right total knee arthroplasty on 11/25/22. He presented with moderate pain severity and irritability with right knee flexion being the most  aggravating to his familiar symptoms. Recommend that he continue with skilled physical therapy to address his impairments to return to his prior level of function.  OBJECTIVE IMPAIRMENTS: Abnormal gait, decreased activity tolerance, decreased mobility, difficulty walking, decreased ROM, decreased strength, hypomobility,  increased edema, impaired tone, and pain.   ACTIVITY LIMITATIONS: carrying, lifting, standing, squatting, sleeping, stairs, transfers, bed mobility, locomotion level, and caring for others  PARTICIPATION LIMITATIONS: meal prep, cleaning, laundry, shopping, community activity, occupation, and yard work  PERSONAL FACTORS: Past/current experiences and 3+ comorbidities: Osteoarthritis, allergies, chronic kidney disease, hypertension, and chronic low back pain  are also affecting patient's functional outcome.   REHAB POTENTIAL: Good  CLINICAL DECISION MAKING: Stable/uncomplicated  EVALUATION COMPLEXITY: Low   GOALS: Goals reviewed with patient? Yes  LONG TERM GOALS: Target date: 12/25/22  Patient will be independent with his HEP. Baseline:  Goal status: INITIAL  2.  Patient will be able to demonstrate at least 120 degrees of right knee flexion for improved function navigating stairs. Baseline:  Goal status: INITIAL  3.  Patient will be able to ambulate with no significant gait deviations. Baseline:  Goal status: INITIAL  4.  Patient will improve his right knee extension within 5 degrees of neutral for improved gait mechanics. Baseline:  Goal status: INITIAL  5.  Patient will be able to navigate at least 4 steps with a reciprocal pattern for improved household mobility. Baseline:  Goal status: INITIAL  PLAN:  PT FREQUENCY: 2-3x/week  PT DURATION: 4 weeks  PLANNED INTERVENTIONS: 97146- PT Re-evaluation, 97110-Therapeutic exercises, 97530- Therapeutic activity, 97112- Neuromuscular re-education, 97535- Self Care, 95621- Manual therapy, [phone]- Gait training,  97014- Electrical stimulation (unattended), 97016- Vasopneumatic device, Patient/Family education, Balance training, Stair training, Joint mobilization, Cryotherapy, and Moist heat  PLAN FOR NEXT SESSION: NuStep, heel slides, quad sets, manual therapy, and modalities as needed   Granville Lewis, PT 11/27/2022, 1:42 PM

## 2022-11-30 ENCOUNTER — Ambulatory Visit: Payer: BC Managed Care – PPO

## 2022-11-30 DIAGNOSIS — M25661 Stiffness of right knee, not elsewhere classified: Secondary | ICD-10-CM

## 2022-11-30 DIAGNOSIS — M25561 Pain in right knee: Secondary | ICD-10-CM

## 2022-11-30 NOTE — Therapy (Signed)
OUTPATIENT PHYSICAL THERAPY LOWER EXTREMITY TREATMENT   Patient Name: Thomas Pham MRN: [id number] DOB:04-25-1955, 67 y.o., male Today's Date: 11/30/2022  END OF SESSION:  PT End of Session - 11/30/22 1020     Visit Number 2    Number of Visits 12    Date for PT Re-Evaluation 12/25/22    PT Start Time 1015    PT Stop Time 1111    PT Time Calculation (min) 56 min    Activity Tolerance Patient tolerated treatment well    Behavior During Therapy [facility] for tasks assessed/performed              Past Medical History:  Diagnosis Date   Allergy    Arthritis    osteo   Basal cell carcinoma 09/13/2014   BCC NODULAR LEFT CHEEK PER ST CLEAR   Basal cell carcinoma (BCC) 09/30/2020   SUP & NOD - R UPPER ABDOMEN (CX3FU)   Basal cell carcinoma of skin 09/14/2019   right malar cheek -MOHS   BCC (basal cell carcinoma of skin) 09/09/2017   BCC MICRONODULAR LEFT CHEEK CX3 AND EXC   BCC (basal cell carcinoma of skin) 09/09/2017   BCC POSITIVE MARGIN MOHS DR PEARCE   Chronic kidney disease    Difficulty sleeping    GERD (gastroesophageal reflux disease)    takes nexium for acid reflux as needed   History of gout    History of hiatal hernia    umbilical hernia   History of kidney stones    Hyperlipidemia    Hypertension    Hypothyroidism    Low back pain    Medial meniscus tear    LEFT   Organic impotence    Penile cyst    Sleep apnea    not using mask d/t preference   Thyroid disease    Past Surgical History:  Procedure Laterality Date   KNEE ARTHROSCOPY Left 03/07/2014   Procedure: LEFT ARTHROSCOPY KNEE WITH MEDIAL MENISCECTOMY  DEBRIDEMENT AND CHONDROPLASTY ;  Surgeon: Loanne Drilling, MD;  Location: [facility];  Service: Orthopedics;  Laterality: Left;   THYROIDECTOMY Right 2013   TONSILLECTOMY     childhood   TOTAL KNEE ARTHROPLASTY Left 03/04/2016   Procedure: LEFT TOTAL KNEE ARTHROPLASTY;  Surgeon: Ollen Gross, MD;  Location: [facility];  Service: Orthopedics;   Laterality: Left;   Patient Active Problem List   Diagnosis Date Noted   Low testosterone 06/26/2021   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   BPH without obstruction/lower urinary tract symptoms 06/26/2021   Severe obstructive sleep apnea-hypopnea syndrome 10/12/2018   OA (osteoarthritis) of knee 03/04/2016   Acute medial meniscal tear 03/06/2014   Cold hands and feet 08/23/2012   REFERRING PROVIDER: Ollen Gross, MD   REFERRING DIAG: Unilateral primary osteoarthritis, right knee   THERAPY DIAG:  Stiffness of right knee, not elsewhere classified  Acute pain of right knee  Rationale for Evaluation and Treatment: Rehabilitation  ONSET DATE: 11/25/22  SUBJECTIVE:   SUBJECTIVE STATEMENT: Patient feels that his pain has gone down tremendously since his last appointment. He still has some pain and soreness from the surgery, but he thinks that he will be walking without a cane by this weekend. He no longer needs any crutches since Friday to go up and down his stairs.   PERTINENT HISTORY: Osteoarthritis, allergies, chronic kidney disease, hypertension, and chronic low back pain PAIN:  Are you having pain? Yes: NPRS scale: 5/10 Pain location: right knee Pain description: stiffness Aggravating factors: using his  knee immobilizers Relieving factors: walking, ice, and medication  PRECAUTIONS: None  RED FLAGS: None   WEIGHT BEARING RESTRICTIONS: No  FALLS:  Has patient fallen in last 6 months? No  LIVING ENVIRONMENT: Lives with: lives with their family Lives in: House/apartment Stairs: Yes: Internal: 2-12 steps; on right going up; "going up with the good and down with the bad"  Has following equipment at home: Dan Humphreys - 4 wheeled  OCCUPATION: TV producer  PLOF: Independent  PATIENT GOALS: be able to play basketball, be able to meet his exercise goals, be able to do yard work, and improved mobility  NEXT MD VISIT: 12/09/22  OBJECTIVE:  Note: Objective measures were completed  at Evaluation unless otherwise noted.  PATIENT SURVEYS:  FOTO 48.07  COGNITION: Overall cognitive status: Within functional limits for tasks assessed     SENSATION: Patient reports no numbness or tingling  EDEMA:  Unable to be assessed due to ace wrap and compression stockings   PALPATION: Mild TTP: right quadriceps, hamstrings, and hip adductors  LOWER EXTREMITY ROM:  Active ROM Right eval Left eval  Hip flexion    Hip extension    Hip abduction    Hip adduction    Hip internal rotation    Hip external rotation    Knee flexion 89/95 (PROM)  130  Knee extension 12 0  Ankle dorsiflexion    Ankle plantarflexion    Ankle inversion    Ankle eversion     (Blank rows = not tested)  LOWER EXTREMITY MMT: not tested due to surgical condition  FUNCTIONAL MOBILITY:  Required modA with sit to supine and supine to sit transfers  GAIT: Assistive device utilized: Environmental consultant - 4 wheeled Level of assistance: Modified independence Comments: step to pattern with decreased stride length and right knee flexed in stance   TODAY'S TREATMENT:                                                                                                                              DATE:                                     11/30/22 EXERCISE LOG  Exercise Repetitions and Resistance Comments  Nustep  L2 x 15 minutes; seat 12-10    Seated HS stretch  4 x 30 seconds    LAQ 20 reps  RLE only   Standing gastroc stretch  2 minutes   Supine heel slide  2 minutes   Supine quad set 10 reps w/ 10 second hodl     Blank cell = exercise not performed today  Modalities: no redness or adverse reaction to today's modalities  Date:  Vaso: Knee, 34 degrees; low pressure, 15 mins, Pain and Edema  PATIENT EDUCATION:  Education details: Plan of care, healing, prognosis, objective findings, and goals for therapy Person educated: Patient Education method: Explanation  Education comprehension: verbalized  understanding  HOME EXERCISE PROGRAM:   ASSESSMENT:  CLINICAL IMPRESSION: Patient was introduced to multiple new interventions for improved right knee mobility. He required minimal cueing with today's new interventions for proper exercise performance. He experienced no significant increase in pain or discomfort with any of today's interventions. He was able to improve his active right knee extension to within 8 degrees of neutral upon the conclusion of today's active interventions. He reported that his knee felt better upon the conclusion of treatment. He continues to require skilled physical therapy to address his remaining impairments to return to his prior level of function.  OBJECTIVE IMPAIRMENTS: Abnormal gait, decreased activity tolerance, decreased mobility, difficulty walking, decreased ROM, decreased strength, hypomobility, increased edema, impaired tone, and pain.   ACTIVITY LIMITATIONS: carrying, lifting, standing, squatting, sleeping, stairs, transfers, bed mobility, locomotion level, and caring for others  PARTICIPATION LIMITATIONS: meal prep, cleaning, laundry, shopping, community activity, occupation, and yard work  PERSONAL FACTORS: Past/current experiences and 3+ comorbidities: Osteoarthritis, allergies, chronic kidney disease, hypertension, and chronic low back pain  are also affecting patient's functional outcome.   REHAB POTENTIAL: Good  CLINICAL DECISION MAKING: Stable/uncomplicated  EVALUATION COMPLEXITY: Low   GOALS: Goals reviewed with patient? Yes  LONG TERM GOALS: Target date: 12/25/22  Patient will be independent with his HEP. Baseline:  Goal status: INITIAL  2.  Patient will be able to demonstrate at least 120 degrees of right knee flexion for improved function navigating stairs. Baseline:  Goal status: INITIAL  3.  Patient will be able to ambulate with no significant gait deviations. Baseline:  Goal status: INITIAL  4.  Patient will improve his  right knee extension within 5 degrees of neutral for improved gait mechanics. Baseline:  Goal status: INITIAL  5.  Patient will be able to navigate at least 4 steps with a reciprocal pattern for improved household mobility. Baseline:  Goal status: INITIAL  PLAN:  PT FREQUENCY: 2-3x/week  PT DURATION: 4 weeks  PLANNED INTERVENTIONS: 97146- PT Re-evaluation, 97110-Therapeutic exercises, 97530- Therapeutic activity, 97112- Neuromuscular re-education, 97535- Self Care, 24401- Manual therapy, [phone]- Gait training, 97014- Electrical stimulation (unattended), 97016- Vasopneumatic device, Patient/Family education, Balance training, Stair training, Joint mobilization, Cryotherapy, and Moist heat  PLAN FOR NEXT SESSION: NuStep, heel slides, quad sets, manual therapy, and modalities as needed   Granville Lewis, PT 11/30/2022, 12:34 PM

## 2022-12-02 ENCOUNTER — Ambulatory Visit: Payer: BC Managed Care – PPO

## 2022-12-02 DIAGNOSIS — M25661 Stiffness of right knee, not elsewhere classified: Secondary | ICD-10-CM | POA: Diagnosis not present

## 2022-12-02 DIAGNOSIS — M25561 Pain in right knee: Secondary | ICD-10-CM

## 2022-12-02 NOTE — Therapy (Signed)
OUTPATIENT PHYSICAL THERAPY LOWER EXTREMITY TREATMENT   Patient Name: Thomas Pham MRN: [id number] DOB:[birth year], 67 y.o., male Today's Date: 12/02/2022  END OF SESSION:  PT End of Session - 12/02/22 1019     Visit Number 3    Number of Visits 12    Date for PT Re-Evaluation 12/25/22    PT Start Time 1015    PT Stop Time 1110    PT Time Calculation (min) 55 min    Activity Tolerance Patient tolerated treatment well    Behavior During Therapy [facility] for tasks assessed/performed               Past Medical History:  Diagnosis Date   Allergy    Arthritis    osteo   Basal cell carcinoma 09/13/2014   BCC NODULAR LEFT CHEEK PER ST CLEAR   Basal cell carcinoma (BCC) 09/30/2020   SUP & NOD - R UPPER ABDOMEN (CX3FU)   Basal cell carcinoma of skin 09/14/2019   right malar cheek -MOHS   BCC (basal cell carcinoma of skin) 09/09/2017   BCC MICRONODULAR LEFT CHEEK CX3 AND EXC   BCC (basal cell carcinoma of skin) 09/09/2017   BCC POSITIVE MARGIN MOHS DR PEARCE   Chronic kidney disease    Difficulty sleeping    GERD (gastroesophageal reflux disease)    takes nexium for acid reflux as needed   History of gout    History of hiatal hernia    umbilical hernia   History of kidney stones    Hyperlipidemia    Hypertension    Hypothyroidism    Low back pain    Medial meniscus tear    LEFT   Organic impotence    Penile cyst    Sleep apnea    not using mask d/t preference   Thyroid disease    Past Surgical History:  Procedure Laterality Date   KNEE ARTHROSCOPY Left 03/07/2014   Procedure: LEFT ARTHROSCOPY KNEE WITH MEDIAL MENISCECTOMY  DEBRIDEMENT AND CHONDROPLASTY ;  Surgeon: Loanne Drilling, MD;  Location: [facility];  Service: Orthopedics;  Laterality: Left;   THYROIDECTOMY Right 2013   TONSILLECTOMY     childhood   TOTAL KNEE ARTHROPLASTY Left 03/04/2016   Procedure: LEFT TOTAL KNEE ARTHROPLASTY;  Surgeon: Ollen Gross, MD;  Location: [facility];  Service: Orthopedics;   Laterality: Left;   Patient Active Problem List   Diagnosis Date Noted   Low testosterone 06/26/2021   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   BPH without obstruction/lower urinary tract symptoms 06/26/2021   Severe obstructive sleep apnea-hypopnea syndrome 10/12/2018   OA (osteoarthritis) of knee 03/04/2016   Acute medial meniscal tear 03/06/2014   Cold hands and feet 08/23/2012   REFERRING PROVIDER: Ollen Gross, MD   REFERRING DIAG: Unilateral primary osteoarthritis, right knee   THERAPY DIAG:  Stiffness of right knee, not elsewhere classified  Acute pain of right knee  Rationale for Evaluation and Treatment: Rehabilitation  ONSET DATE: 11/25/22  SUBJECTIVE:   SUBJECTIVE STATEMENT: Patient reports that his thigh and knee is really sore today. He notes that he was on his feet "all day yesterday." He notes that he felt "great" after his last appointment. He has been having some right low back pain when standing and walking for the past 4-5 days.   PERTINENT HISTORY: Osteoarthritis, allergies, chronic kidney disease, hypertension, and chronic low back pain PAIN:  Are you having pain? Yes: NPRS scale: 9.5/10 Pain location: right knee Pain description: soreness Aggravating factors: using his knee  immobilizers Relieving factors: walking, ice, and medication  PRECAUTIONS: None  RED FLAGS: None   WEIGHT BEARING RESTRICTIONS: No  FALLS:  Has patient fallen in last 6 months? No  LIVING ENVIRONMENT: Lives with: lives with their family Lives in: House/apartment Stairs: Yes: Internal: 2-12 steps; on right going up; "going up with the good and down with the bad"  Has following equipment at home: Dan Humphreys - 4 wheeled  OCCUPATION: TV producer  PLOF: Independent  PATIENT GOALS: be able to play basketball, be able to meet his exercise goals, be able to do yard work, and improved mobility  NEXT MD VISIT: 12/09/22  OBJECTIVE:  Note: Objective measures were completed at Evaluation  unless otherwise noted.  PATIENT SURVEYS:  FOTO 48.07  COGNITION: Overall cognitive status: Within functional limits for tasks assessed     SENSATION: Patient reports no numbness or tingling  EDEMA:  Unable to be assessed due to ace wrap and compression stockings   PALPATION: Mild TTP: right quadriceps, hamstrings, and hip adductors  LOWER EXTREMITY ROM:  Active ROM Right eval Left eval  Hip flexion    Hip extension    Hip abduction    Hip adduction    Hip internal rotation    Hip external rotation    Knee flexion 89/95 (PROM)  130  Knee extension 12 0  Ankle dorsiflexion    Ankle plantarflexion    Ankle inversion    Ankle eversion     (Blank rows = not tested)  LOWER EXTREMITY MMT: not tested due to surgical condition  FUNCTIONAL MOBILITY:  Required modA with sit to supine and supine to sit transfers  GAIT: Assistive device utilized: Environmental consultant - 4 wheeled Level of assistance: Modified independence Comments: step to pattern with decreased stride length and right knee flexed in stance   TODAY'S TREATMENT:                                                                                                                              DATE:                                     12/02/22 EXERCISE LOG  Exercise Repetitions and Resistance Comments  Nustep L2 x 15 minutes; seat 11   Rocker board  5 minutes   Lunges onto step  6" step x 3 minutes  RLE only   Standing HS stretch  3 x 30 seconds  RLE only   Supine double knee to chest  2 minutes  With BLE supported on red ball for knee flexion    Blank cell = exercise not performed today  Modalities  Date:  Vaso: Knee, 34 degrees; low pressure, 15 mins, Pain and Edema  11/30/22 EXERCISE LOG  Exercise Repetitions and Resistance Comments  Nustep  L2 x 15 minutes; seat 12-10    Seated HS stretch  4 x 30 seconds    LAQ 20 reps  RLE only   Standing gastroc stretch  2 minutes   Supine heel  slide  2 minutes   Supine quad set 10 reps w/ 10 second hodl     Blank cell = exercise not performed today  Modalities: no redness or adverse reaction to today's modalities  Date:  Vaso: Knee, 34 degrees; low pressure, 15 mins, Pain and Edema  PATIENT EDUCATION:  Education details: Plan of care, healing, prognosis, objective findings, and goals for therapy Person educated: Patient Education method: Explanation Education comprehension: verbalized understanding  HOME EXERCISE PROGRAM:   ASSESSMENT:  CLINICAL IMPRESSION: Patient was introduced to multiple new interventions for improved knee mobility. He required minimal cueing with today's interventions for proper pacing to avoid a significant increase in soreness. He experienced a mild reduction in his familiar symptoms with today's active interventions. He reported that his knee felt better upon the conclusion of treatment. He continues to require skilled physical therapy to address his remaining impairments to return to his prior level of function.   OBJECTIVE IMPAIRMENTS: Abnormal gait, decreased activity tolerance, decreased mobility, difficulty walking, decreased ROM, decreased strength, hypomobility, increased edema, impaired tone, and pain.   ACTIVITY LIMITATIONS: carrying, lifting, standing, squatting, sleeping, stairs, transfers, bed mobility, locomotion level, and caring for others  PARTICIPATION LIMITATIONS: meal prep, cleaning, laundry, shopping, community activity, occupation, and yard work  PERSONAL FACTORS: Past/current experiences and 3+ comorbidities: Osteoarthritis, allergies, chronic kidney disease, hypertension, and chronic low back pain  are also affecting patient's functional outcome.   REHAB POTENTIAL: Good  CLINICAL DECISION MAKING: Stable/uncomplicated  EVALUATION COMPLEXITY: Low   GOALS: Goals reviewed with patient? Yes  LONG TERM GOALS: Target date: 12/25/22  Patient will be independent with his  HEP. Baseline:  Goal status: INITIAL  2.  Patient will be able to demonstrate at least 120 degrees of right knee flexion for improved function navigating stairs. Baseline:  Goal status: INITIAL  3.  Patient will be able to ambulate with no significant gait deviations. Baseline:  Goal status: INITIAL  4.  Patient will improve his right knee extension within 5 degrees of neutral for improved gait mechanics. Baseline:  Goal status: INITIAL  5.  Patient will be able to navigate at least 4 steps with a reciprocal pattern for improved household mobility. Baseline:  Goal status: INITIAL  PLAN:  PT FREQUENCY: 2-3x/week  PT DURATION: 4 weeks  PLANNED INTERVENTIONS: 97146- PT Re-evaluation, 97110-Therapeutic exercises, 97530- Therapeutic activity, 97112- Neuromuscular re-education, 97535- Self Care, 08657- Manual therapy, [phone]- Gait training, 97014- Electrical stimulation (unattended), 97016- Vasopneumatic device, Patient/Family education, Balance training, Stair training, Joint mobilization, Cryotherapy, and Moist heat  PLAN FOR NEXT SESSION: NuStep, heel slides, quad sets, manual therapy, and modalities as needed   Granville Lewis, PT 12/02/2022, 12:22 PM

## 2022-12-04 ENCOUNTER — Ambulatory Visit: Payer: BC Managed Care – PPO

## 2022-12-04 DIAGNOSIS — M25561 Pain in right knee: Secondary | ICD-10-CM

## 2022-12-04 DIAGNOSIS — M25661 Stiffness of right knee, not elsewhere classified: Secondary | ICD-10-CM | POA: Diagnosis not present

## 2022-12-04 NOTE — Therapy (Signed)
OUTPATIENT PHYSICAL THERAPY LOWER EXTREMITY TREATMENT   Patient Name: Thomas Pham MRN: [id number] DOB:November 24, 1955, 67 y.o., male Today's Date: 12/04/2022  END OF SESSION:  PT End of Session - 12/04/22 1023     Visit Number 4    Number of Visits 12    Date for PT Re-Evaluation 12/25/22    PT Start Time 1015    PT Stop Time 1105    PT Time Calculation (min) 50 min    Activity Tolerance Patient tolerated treatment well    Behavior During Therapy [facility] for tasks assessed/performed                Past Medical History:  Diagnosis Date   Allergy    Arthritis    osteo   Basal cell carcinoma 09/13/2014   BCC NODULAR LEFT CHEEK PER ST CLE[facility]   Basal cell carcinoma (BCC) 09/30/2020   SUP & NOD - R UPPER ABDOMEN (CX3FU)   Basal cell carcinoma of skin 09/14/2019   right malar cheek -MOHS   BCC (basal cell carcinoma of skin) 09/09/2017   BCC MICRONODULAR LEFT CHEEK CX3 AND EXC   BCC (basal cell carcinoma of skin) 09/09/2017   BCC POSITIVE MARGIN MOHS DR PEARCE   Chronic kidney disease    Difficulty sleeping    GERD (gastroesophageal reflux disease)    takes nexium for acid reflux as needed   History of gout    History of hiatal hernia    umbilical hernia   History of kidney stones    Hyperlipidemia    Hypertension    Hypothyroidism    Low back pain    Medial meniscus tear    LEFT   Organic impotence    Penile cyst    Sleep apnea    not using mask d/t preference   Thyroid disease    Past Surgical History:  Procedure Laterality Date   KNEE ARTHROSCOPY Left 03/07/2014   Procedure: LEFT ARTHROSCOPY KNEE WITH MEDIAL MENISCECTOMY  DEBRIDEMENT AND CHONDROPLASTY ;  Surgeon: Loanne Drilling, MD;  Location: [facility];  Service: Orthopedics;  Laterality: Left;   THYROIDECTOMY Right 2013   TONSILLECTOMY     childhood   TOTAL KNEE ARTHROPLASTY Left 03/04/2016   Procedure: LEFT TOTAL KNEE ARTHROPLASTY;  Surgeon: Ollen Gross, MD;  Location: [facility];  Service: Orthopedics;   Laterality: Left;   Patient Active Problem List   Diagnosis Date Noted   Low testosterone 06/26/2021   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   BPH without obstruction/lower urinary tract symptoms 06/26/2021   Severe obstructive sleep apnea-hypopnea syndrome 10/12/2018   OA (osteoarthritis) of knee 03/04/2016   Acute medial meniscal tear 03/06/2014   Cold hands and feet 08/23/2012   REFERRING PROVIDER: Ollen Gross, MD   REFERRING DIAG: Unilateral primary osteoarthritis, right knee   THERAPY DIAG:  Stiffness of right knee, not elsewhere classified  Acute pain of right knee  Rationale for Evaluation and Treatment: Rehabilitation  ONSET DATE: 11/25/22  SUBJECTIVE:   SUBJECTIVE STATEMENT: Patient reports that he had a setback since his last appointment. He notes that he accidentally pulled his knees up to his chest while he was dreaming on Wednesday night. This really aggravated his knee and it is still sore now.   PERTINENT HISTORY: Osteoarthritis, allergies, chronic kidney disease, hypertension, and chronic low back pain PAIN:  Are you having pain? Yes: NPRS scale: 7-8/10 Pain location: right knee Pain description: soreness Aggravating factors: using his knee immobilizers Relieving factors: walking, ice, and medication  PRECAUTIONS: None  RED FLAGS: None   WEIGHT BEARING RESTRICTIONS: No  FALLS:  Has patient fallen in last 6 months? No  LIVING ENVIRONMENT: Lives with: lives with their family Lives in: House/apartment Stairs: Yes: Internal: 2-12 steps; on right going up; "going up with the good and down with the bad"  Has following equipment at home: Dan Humphreys - 4 wheeled  OCCUPATION: TV producer  PLOF: Independent  PATIENT GOALS: be able to play basketball, be able to meet his exercise goals, be able to do yard work, and improved mobility  NEXT MD VISIT: 12/09/22  OBJECTIVE:  Note: Objective measures were completed at Evaluation unless otherwise noted.  PATIENT  SURVEYS:  FOTO 48.07  COGNITION: Overall cognitive status: Within functional limits for tasks assessed     SENSATION: Patient reports no numbness or tingling  EDEMA:  Unable to be assessed due to ace wrap and compression stockings   PALPATION: Mild TTP: right quadriceps, hamstrings, and hip adductors  LOWER EXTREMITY ROM:  Active ROM Right eval Left eval  Hip flexion    Hip extension    Hip abduction    Hip adduction    Hip internal rotation    Hip external rotation    Knee flexion 89/95 (PROM)  130  Knee extension 12 0  Ankle dorsiflexion    Ankle plantarflexion    Ankle inversion    Ankle eversion     (Blank rows = not tested)  LOWER EXTREMITY MMT: not tested due to surgical condition  FUNCTIONAL MOBILITY:  Required modA with sit to supine and supine to sit transfers  GAIT: Assistive device utilized: Environmental consultant - 4 wheeled Level of assistance: Modified independence Comments: step to pattern with decreased stride length and right knee flexed in stance   TODAY'S TREATMENT:                                                                                                                              DATE:                                     12/04/22 EXERCISE LOG  Exercise Repetitions and Resistance Comments  Nustep  L5 x 15 minutes; seat 11   Rocker board  5 minutes   Lunges onto step  8" step x 5 reps  Limited by sharp pain   Side stepping on foam  3 minutes  BUE support        Blank cell = exercise not performed today  Manual Therapy Soft Tissue Mobilization: right quadriceps, for reduced pain and tone Passive ROM: right knee flexion and extension, for improved knee mobility   Modalities: no adverse reaction to today's modalities  Date:  Vaso: Knee, 34 degrees; low pressure, 15 mins, Pain and Tone  12/02/22 EXERCISE LOG  Exercise Repetitions and Resistance Comments  Nustep L2 x 15 minutes; seat 11   Rocker board  5  minutes   Lunges onto step  6" step x 3 minutes  RLE only   Standing HS stretch  3 x 30 seconds  RLE only   Supine double knee to chest  2 minutes  With BLE supported on red ball for knee flexion    Blank cell = exercise not performed today  Modalities  Date:  Vaso: Knee, 34 degrees; low pressure, 15 mins, Pain and Edema                                   11/30/22 EXERCISE LOG  Exercise Repetitions and Resistance Comments  Nustep  L2 x 15 minutes; seat 12-10    Seated HS stretch  4 x 30 seconds    LAQ 20 reps  RLE only   Standing gastroc stretch  2 minutes   Supine heel slide  2 minutes   Supine quad set 10 reps w/ 10 second hodl     Blank cell = exercise not performed today  Modalities: no redness or adverse reaction to today's modalities  Date:  Vaso: Knee, 34 degrees; low pressure, 15 mins, Pain and Edema  PATIENT EDUCATION:  Education details: Plan of care, healing, prognosis, objective findings, and goals for therapy Person educated: Patient Education method: Explanation Education comprehension: verbalized understanding  HOME EXERCISE PROGRAM:   ASSESSMENT:  CLINICAL IMPRESSION: Patient presented to treatment with increased right quadriceps pain which limited his ability to be introduced to new interventions for improved knee flexion. He was educated on the expectations for soreness and discomfort following a total knee arthroplasty. He reported understanding. Manual therapy focused on soft tissue mobilization to the right quadriceps for reduced pain and tone with fair results. He reported that his knee felt better upon the conclusion of treatment. He continues to require skilled physical therapy to address his remaining impairments to return to his prior level of function.    OBJECTIVE IMPAIRMENTS: Abnormal gait, decreased activity tolerance, decreased mobility, difficulty walking, decreased ROM, decreased strength, hypomobility, increased edema, impaired tone, and pain.    ACTIVITY LIMITATIONS: carrying, lifting, standing, squatting, sleeping, stairs, transfers, bed mobility, locomotion level, and caring for others  PARTICIPATION LIMITATIONS: meal prep, cleaning, laundry, shopping, community activity, occupation, and yard work  PERSONAL FACTORS: Past/current experiences and 3+ comorbidities: Osteoarthritis, allergies, chronic kidney disease, hypertension, and chronic low back pain  are also affecting patient's functional outcome.   REHAB POTENTIAL: Good  CLINICAL DECISION MAKING: Stable/uncomplicated  EVALUATION COMPLEXITY: Low   GOALS: Goals reviewed with patient? Yes  LONG TERM GOALS: Target date: 12/25/22  Patient will be independent with his HEP. Baseline:  Goal status: INITIAL  2.  Patient will be able to demonstrate at least 120 degrees of right knee flexion for improved function navigating stairs. Baseline:  Goal status: INITIAL  3.  Patient will be able to ambulate with no significant gait deviations. Baseline:  Goal status: INITIAL  4.  Patient will improve his right knee extension within 5 degrees of neutral for improved gait mechanics. Baseline:  Goal status: INITIAL  5.  Patient will be able to navigate at least 4 steps with a reciprocal pattern for improved household mobility. Baseline:  Goal status: INITIAL  PLAN:  PT FREQUENCY: 2-3x/week  PT DURATION: 4 weeks  PLANNED INTERVENTIONS: 96045- PT  Re-evaluation, 97110-Therapeutic exercises, 97530- Therapeutic activity, [id number]- Neuromuscular re-education, 97535- Self Care, 16109- Manual therapy, [id number]- Gait training, 97014- Electrical stimulation (unattended), 97016- Vasopneumatic device, Patient/Family education, Balance training, Stair training, Joint mobilization, Cryotherapy, and Moist heat  PLAN FOR NEXT SESSION: NuStep, heel slides, quad sets, manual therapy, and modalities as needed   Granville Lewis, PT 12/04/2022, 1:20 PM

## 2022-12-07 ENCOUNTER — Ambulatory Visit: Payer: BC Managed Care – PPO

## 2022-12-07 DIAGNOSIS — M25661 Stiffness of right knee, not elsewhere classified: Secondary | ICD-10-CM

## 2022-12-07 DIAGNOSIS — M25561 Pain in right knee: Secondary | ICD-10-CM

## 2022-12-07 NOTE — Therapy (Signed)
OUTPATIENT PHYSICAL THERAPY LOWER EXTREMITY TREATMENT   Patient Name: Thomas Pham MRN: [id number] DOB:[birth year], 67 y.o., male Today's Date: 12/07/2022  END OF SESSION:  PT End of Session - 12/07/22 1354     Visit Number 5    Number of Visits 12    Date for PT Re-Evaluation 12/25/22    PT Start Time 1345    PT Stop Time 1445    PT Time Calculation (min) 60 min    Activity Tolerance Patient tolerated treatment well    Behavior During Therapy [facility] for tasks assessed/performed                 Past Medical History:  Diagnosis Date   Allergy    Arthritis    osteo   Basal cell carcinoma 09/13/2014   BCC NODULAR LEFT CHEEK PER ST CLEAR   Basal cell carcinoma (BCC) 09/30/2020   SUP & NOD - R UPPER ABDOMEN (CX3FU)   Basal cell carcinoma of skin 09/14/2019   right malar cheek -MOHS   BCC (basal cell carcinoma of skin) 09/09/2017   BCC MICRONODULAR LEFT CHEEK CX3 AND EXC   BCC (basal cell carcinoma of skin) 09/09/2017   BCC POSITIVE MARGIN MOHS DR PEARCE   Chronic kidney disease    Difficulty sleeping    GERD (gastroesophageal reflux disease)    takes nexium for acid reflux as needed   History of gout    History of hiatal hernia    umbilical hernia   History of kidney stones    Hyperlipidemia    Hypertension    Hypothyroidism    Low back pain    Medial meniscus tear    LEFT   Organic impotence    Penile cyst    Sleep apnea    not using mask d/t preference   Thyroid disease    Past Surgical History:  Procedure Laterality Date   KNEE ARTHROSCOPY Left 03/07/2014   Procedure: LEFT ARTHROSCOPY KNEE WITH MEDIAL MENISCECTOMY  DEBRIDEMENT AND CHONDROPLASTY ;  Surgeon: Loanne Drilling, MD;  Location: [facility];  Service: Orthopedics;  Laterality: Left;   THYROIDECTOMY Right 2013   TONSILLECTOMY     childhood   TOTAL KNEE ARTHROPLASTY Left 03/04/2016   Procedure: LEFT TOTAL KNEE ARTHROPLASTY;  Surgeon: Ollen Gross, MD;  Location: [facility];  Service: Orthopedics;   Laterality: Left;   Patient Active Problem List   Diagnosis Date Noted   Low testosterone 06/26/2021   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 However, he did a lot of cleaning on Sunday. He also had two other doctors appointments today and has done a lot of walking.   PERTINENT HISTORY: Osteoarthritis, allergies, chronic kidney disease, hypertension, and chronic low back pain PAIN:  Are you having pain? Yes: NPRS scale: 7-8/10 Pain location: right knee Pain description: soreness Aggravating factors: using his knee immobilizers Relieving factors: walking, ice, and medication  PRECAUTIONS: None  RED FLAGS: None  WEIGHT BEARING RESTRICTIONS: No  FALLS:  Has patient fallen in last 6 months? No  LIVING ENVIRONMENT: Lives with: lives with their family Lives in: House/apartment Stairs: Yes: Internal: 2-12 steps; on right going up; "going up with the good and down with the bad"  Has following equipment at home: Dan Humphreys - 4 wheeled  OCCUPATION: TV producer  PLOF: Independent  PATIENT GOALS: be able to play basketball, be able to meet his exercise goals, be able to do yard work, and improved mobility  NEXT MD VISIT: 12/09/22  OBJECTIVE:  Note: Objective measures were completed at Evaluation unless otherwise noted.  PATIENT SURVEYS:  FOTO 48.07  COGNITION: Overall cognitive  status: Within functional limits for tasks assessed     SENSATION: Patient reports no numbness or tingling  EDEMA:  Unable to be assessed due to ace wrap and compression stockings   PALPATION: Mild TTP: right quadriceps, hamstrings, and hip adductors  LOWER EXTREMITY ROM:  Active ROM Right eval Right 12/07/22 Left eval  Hip flexion     Hip extension     Hip abduction     Hip adduction     Hip internal rotation     Hip external rotation     Knee flexion 89/95 (PROM)  80 130  Knee extension 12 9 0  Ankle dorsiflexion     Ankle plantarflexion     Ankle inversion     Ankle eversion      (Blank rows = not tested)  LOWER EXTREMITY MMT: not tested due to surgical condition  FUNCTIONAL MOBILITY:  Required modA with sit to supine and supine to sit transfers  GAIT: Assistive device utilized: Environmental consultant - 4 wheeled Level of assistance: Modified independence Comments: step to pattern with decreased stride length and right knee flexed in stance   TODAY'S TREATMENT:                                                                                                                              DATE:                                     12/07/22 EXERCISE LOG  Exercise Repetitions and Resistance Comments  Nustep  L4 x 15 minutes; seat 11-10   Quad set  15 reps w/ 5 second hold    Heel slide  15 reps    SLR  20 reps  Minimal right knee extension lag  Mini squat  25 reps  BUE support   Blank cell = exercise not performed today  Manual Therapy Soft Tissue Mobilization: right quadriceps and IT band, for reduced pain and improved soft tissue extensibility Joint Mobilizations: patellar, grade I-III Passive ROM: right knee flexion and extension, to tolerance   Modalities: no redness or adverse reaction to today's modalities  Date:  Unattended Estim: right quadriceps, pre mod @ 80-150 Hz, 15 mins,  Pain and Tone Vaso: Knee, 34 degrees; low pressure, 15 mins, Pain and Edema                                    12/04/22 EXERCISE LOG  Exercise Repetitions and Resistance Comments  Nustep  L5 x 15 minutes; seat 11   Rocker board  5 minutes   Lunges onto step  8" step x 5 reps  Limited by sharp pain   Side stepping on foam  3 minutes  BUE support        Blank cell = exercise not performed today  Manual Therapy Soft Tissue Mobilization: right quadriceps, for reduced pain and tone Passive ROM: right knee flexion and extension, for improved knee mobility   Modalities: no adverse reaction to today's modalities  Date:  Vaso: Knee, 34 degrees; low pressure, 15 mins, Pain and Tone                                   12/02/22 EXERCISE LOG  Exercise Repetitions and Resistance Comments  Nustep L2 x 15 minutes; seat 11   Rocker board  5 minutes   Lunges onto step  6" step x 3 minutes  RLE only   Standing HS stretch  3 x 30 seconds  RLE only   Supine double knee to chest  2 minutes  With BLE supported on red ball for knee flexion    Blank cell = exercise not performed today  Modalities  Date:  Vaso: Knee, 34 degrees; low pressure, 15 mins, Pain and Edema  PATIENT EDUCATION:  Education details: Plan of care, healing, prognosis, objective findings, and goals for therapy Person educated: Patient Education method: Explanation Education comprehension: verbalized understanding  HOME EXERCISE PROGRAM:   ASSESSMENT:  CLINICAL IMPRESSION: Today's treatment focused primarily on soft tissue mobilization to his right quadriceps and IT band for reduced pain and tone to promote improved knee mobility. This was able to slightly improve his knee mobility, but he continued to experience increased thigh pain at end range knee flexion. This was followed by appropriately matched interventions for knee flexion and extension. He reported that his knee felt better upon the conclusion of treatment. He continues to require skilled physical therapy to address his remaining impairments to return to his  prior level of function.   OBJECTIVE IMPAIRMENTS: Abnormal gait, decreased activity tolerance, decreased mobility, difficulty walking, decreased ROM, decreased strength, hypomobility, increased edema, impaired tone, and pain.   ACTIVITY LIMITATIONS: carrying, lifting, standing, squatting, sleeping, stairs, transfers, bed mobility, locomotion level, and caring for others  PARTICIPATION LIMITATIONS: meal prep, cleaning, laundry, shopping, community activity, occupation, and yard work  PERSONAL FACTORS: Past/current experiences and 3+ comorbidities: Osteoarthritis, allergies, chronic kidney disease, hypertension, and chronic low back pain  are also affecting patient's functional outcome.   REHAB POTENTIAL: Good  CLINICAL DECISION MAKING: Stable/uncomplicated  EVALUATION COMPLEXITY: Low   GOALS: Goals reviewed with patient? Yes  LONG TERM GOALS: Target date: 12/25/22  Patient will be independent with his HEP. Baseline:  Goal status: INITIAL  2.  Patient will be able to demonstrate at least 120 degrees of right knee flexion for improved function navigating stairs. Baseline:  Goal status: INITIAL  3.  Patient will be able to ambulate with no significant gait deviations. Baseline:  Goal status: INITIAL  4.  Patient will improve  his right knee extension within 5 degrees of neutral for improved gait mechanics. Baseline:  Goal status: INITIAL  5.  Patient will be able to navigate at least 4 steps with a reciprocal pattern for improved household mobility. Baseline:  Goal status: INITIAL  PLAN:  PT FREQUENCY: 2-3x/week  PT DURATION: 4 weeks  PLANNED INTERVENTIONS: 97146- PT Re-evaluation, 97110-Therapeutic exercises, 97530- Therapeutic activity, 97112- Neuromuscular re-education, 97535- Self Care, 11914- Manual therapy, [phone]- Gait training, 97014- Electrical stimulation (unattended), 97016- Vasopneumatic device, Patient/Family education, Balance training, Stair training, Joint  mobilization, Cryotherapy, and Moist heat  PLAN FOR NEXT SESSION: NuStep, heel slides, quad sets, manual therapy, and modalities as needed   Granville Lewis, PT 12/07/2022, 4:48 PM

## 2022-12-09 ENCOUNTER — Ambulatory Visit: Payer: BC Managed Care – PPO

## 2022-12-09 DIAGNOSIS — M25661 Stiffness of right knee, not elsewhere classified: Secondary | ICD-10-CM

## 2022-12-09 DIAGNOSIS — M25561 Pain in right knee: Secondary | ICD-10-CM

## 2022-12-09 NOTE — Therapy (Signed)
OUTPATIENT PHYSICAL THERAPY LOWER EXTREMITY TREATMENT   Patient Name: Thomas Pham MRN: [id number] DOB:Jul 27, 1955, 67 y.o., male Today's Date: 12/09/2022  END OF SESSION:  PT End of Session - 12/09/22 1034     Visit Number 6    Number of Visits 12    Date for PT Re-Evaluation 12/25/22    PT Start Time 1015    PT Stop Time 1109    PT Time Calculation (min) 54 min    Activity Tolerance Patient tolerated treatment well    Behavior During Therapy [facility] for tasks assessed/performed                  Past Medical History:  Diagnosis Date   Allergy    Arthritis    osteo   Basal cell carcinoma 09/13/2014   BCC NODULAR LEFT CHEEK PER ST [facility]   Basal cell carcinoma (BCC) 09/30/2020   SUP & NOD - R UPPER ABDOMEN (CX3FU)   Basal cell carcinoma of skin 09/14/2019   right malar cheek -MOHS   BCC (basal cell carcinoma of skin) 09/09/2017   BCC MICRONODULAR LEFT CHEEK CX3 AND EXC   BCC (basal cell carcinoma of skin) 09/09/2017   BCC POSITIVE MARGIN MOHS DR PEARCE   Chronic kidney disease    Difficulty sleeping    GERD (gastroesophageal reflux disease)    takes nexium for acid reflux as needed   History of gout    History of hiatal hernia    umbilical hernia   History of kidney stones    Hyperlipidemia    Hypertension    Hypothyroidism    Low back pain    Medial meniscus tear    LEFT   Organic impotence    Penile cyst    Sleep apnea    not using mask d/t preference   Thyroid disease    Past Surgical History:  Procedure Laterality Date   KNEE ARTHROSCOPY Left 03/07/2014   Procedure: LEFT ARTHROSCOPY KNEE WITH MEDIAL MENISCECTOMY  DEBRIDEMENT AND CHONDROPLASTY ;  Surgeon: Loanne Drilling, MD;  Location: [facility];  Service: Orthopedics;  Laterality: Left;   THYROIDECTOMY Right 2013   TONSILLECTOMY     childhood   TOTAL KNEE ARTHROPLASTY Left 03/04/2016   Procedure: LEFT TOTAL KNEE ARTHROPLASTY;  Surgeon: Ollen Gross, MD;  Location: [facility];  Service: Orthopedics;   Laterality: Left;   Patient Active Problem List   Diagnosis Date Noted   Low testosterone 06/26/2021   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   BPH without obstruction/lower urinary tract symptoms 06/26/2021   Severe obstructive sleep apnea-hypopnea syndrome 10/12/2018   OA (osteoarthritis) of knee 03/04/2016   Acute medial meniscal tear 03/06/2014   Cold hands and feet 08/23/2012   REFERRING PROVIDER: Ollen Gross, MD   REFERRING DIAG: Unilateral primary osteoarthritis, right knee   THERAPY DIAG:  Stiffness of right knee, not elsewhere classified  Acute pain of right knee  Rationale for Evaluation and Treatment: Rehabilitation  ONSET DATE: 11/25/22  SUBJECTIVE:   SUBJECTIVE STATEMENT: Patient reports that his knee was really hurting yesterday, but he did a lot of walking to and from different doctors appointments. He notes that it is a little better today, but his thigh is really tight and stiff. He feels that his pain is typically around a 2/10, but it will get up to a 7/10 if it catches.   PERTINENT HISTORY: Osteoarthritis, allergies, chronic kidney disease, hypertension, and chronic low back pain PAIN:  Are you having pain? Yes: NPRS scale:  2-7/10 Pain location: right knee Pain description: soreness Aggravating factors: using his knee immobilizers Relieving factors: walking, ice, and medication  PRECAUTIONS: None  RED FLAGS: None   WEIGHT BEARING RESTRICTIONS: No  FALLS:  Has patient fallen in last 6 months? No  LIVING ENVIRONMENT: Lives with: lives with their family Lives in: House/apartment Stairs: Yes: Internal: 2-12 steps; on right going up; "going up with the good and down with the bad"  Has following equipment at home: Dan Humphreys - 4 wheeled  OCCUPATION: TV producer  PLOF: Independent  PATIENT GOALS: be able to play basketball, be able to meet his exercise goals, be able to do yard work, and improved mobility  NEXT MD VISIT: 12/09/22  OBJECTIVE:  Note:  Objective measures were completed at Evaluation unless otherwise noted.  PATIENT SURVEYS:  FOTO 48.07  COGNITION: Overall cognitive status: Within functional limits for tasks assessed     SENSATION: Patient reports no numbness or tingling  EDEMA:  Unable to be assessed due to ace wrap and compression stockings   PALPATION: Mild TTP: right quadriceps, hamstrings, and hip adductors  LOWER EXTREMITY ROM:  Active ROM Right eval Right 12/07/22 Left eval  Hip flexion     Hip extension     Hip abduction     Hip adduction     Hip internal rotation     Hip external rotation     Knee flexion 89/95 (PROM)  80 130  Knee extension 12 9 0  Ankle dorsiflexion     Ankle plantarflexion     Ankle inversion     Ankle eversion      (Blank rows = not tested)  LOWER EXTREMITY MMT: not tested due to surgical condition  FUNCTIONAL MOBILITY:  Required modA with sit to supine and supine to sit transfers  GAIT: Assistive device utilized: Environmental consultant - 4 wheeled Level of assistance: Modified independence Comments: step to pattern with decreased stride length and right knee flexed in stance   TODAY'S TREATMENT:                                                                                                                              DATE:                                     12/09/22 EXERCISE LOG  Exercise Repetitions and Resistance Comments  Nustep L3 x 15 minutes; seat 10    Standing gastroc stretch  4 x 30 seconds   Standing HS stretch  4 x 30 seconds  RLE only   LAQ 2 minutes RLE only   Lunges onto step  6" step x 3 minutes RLE on step    Blank cell = exercise not performed today  Modalities: no redness or adverse reaction to today's modalities  Date:  Vaso: Knee, 34 degrees; low pressure, 15 mins, Pain  12/07/22 EXERCISE LOG  Exercise Repetitions and Resistance Comments  Nustep  L4 x 15 minutes; seat 11-10   Quad set  15 reps w/ 5 second hold     Heel slide  15 reps    SLR  20 reps  Minimal right knee extension lag  Mini squat  25 reps  BUE support   Blank cell = exercise not performed today  Manual Therapy Soft Tissue Mobilization: right quadriceps and IT band, for reduced pain and improved soft tissue extensibility Joint Mobilizations: patellar, grade I-III Passive ROM: right knee flexion and extension, to tolerance   Modalities: no redness or adverse reaction to today's modalities  Date:  Unattended Estim: right quadriceps, pre mod @ 80-150 Hz, 15 mins, Pain and Tone Vaso: Knee, 34 degrees; low pressure, 15 mins, Pain and Edema                                   12/04/22 EXERCISE LOG  Exercise Repetitions and Resistance Comments  Nustep  L5 x 15 minutes; seat 11   Rocker board  5 minutes   Lunges onto step  8" step x 5 reps  Limited by sharp pain   Side stepping on foam  3 minutes  BUE support        Blank cell = exercise not performed today  Manual Therapy Soft Tissue Mobilization: right quadriceps, for reduced pain and tone Passive ROM: right knee flexion and extension, for improved knee mobility   Modalities: no adverse reaction to today's modalities  Date:  Vaso: Knee, 34 degrees; low pressure, 15 mins, Pain and Tone  PATIENT EDUCATION:  Education details: Plan of care, healing, prognosis, objective findings, and goals for therapy Person educated: Patient Education method: Explanation Education comprehension: verbalized understanding  HOME EXERCISE PROGRAM:   ASSESSMENT:  CLINICAL IMPRESSION: Patient is making fair progress with skilled physical therapy as evidenced by his subjective reports, right knee extension AROM, functional mobility, and progress toward his goals. He was able to demonstrate a significant improvement in right knee extension, but he has yet to meet his long term goal. However, he continues to be significant limitation in right knee flexion with pain being his primary limiting factor.  Today's interventions focused on familiar interventions for improved knee mobility. He reported that his knee felt better upon the conclusion of treatment. He continues to require skilled physical therapy to address his remaining impairments to return to his prior level of function.   OBJECTIVE IMPAIRMENTS: Abnormal gait, decreased activity tolerance, decreased mobility, difficulty walking, decreased ROM, decreased strength, hypomobility, increased edema, impaired tone, and pain.   ACTIVITY LIMITATIONS: carrying, lifting, standing, squatting, sleeping, stairs, transfers, bed mobility, locomotion level, and caring for others  PARTICIPATION LIMITATIONS: meal prep, cleaning, laundry, shopping, community activity, occupation, and yard work  PERSONAL FACTORS: Past/current experiences and 3+ comorbidities: Osteoarthritis, allergies, chronic kidney disease, hypertension, and chronic low back pain  are also affecting patient's functional outcome.   REHAB POTENTIAL: Good  CLINICAL DECISION MAKING: Stable/uncomplicated  EVALUATION COMPLEXITY: Low   GOALS: Goals reviewed with patient? Yes  LONG TERM GOALS: Target date: 12/25/22  Patient will be independent with his HEP. Baseline:  Goal status: MET  2.  Patient will be able to demonstrate at least 120 degrees of right knee flexion for improved function navigating stairs. Baseline: 70 degrees prior to pain Goal status: ON GOING  3.  Patient will be able to  ambulate with no significant gait deviations. Baseline:  Goal status: ON GOING  4.  Patient will improve his right knee extension within 5 degrees of neutral for improved gait mechanics. Baseline: 6 degrees Goal status: ON GOING  5.  Patient will be able to navigate at least 4 steps with a reciprocal pattern for improved household mobility. Baseline: step to pattern Goal status: ON GOING  PLAN:  PT FREQUENCY: 2-3x/week  PT DURATION: 4 weeks  PLANNED INTERVENTIONS: 97146- PT  Re-evaluation, 97110-Therapeutic exercises, 97530- Therapeutic activity, 97112- Neuromuscular re-education, 97535- Self Care, 95284- Manual therapy, 97116- Gait training, 97014- Electrical stimulation (unattended), 97016- Vasopneumatic device, Patient/Family education, Balance training, Stair training, Joint mobilization, Cryotherapy, and Moist heat  PLAN FOR NEXT SESSION: NuStep, heel slides, quad sets, manual therapy, and modalities as needed   Granville Lewis, PT 12/09/2022, 12:47 PM

## 2022-12-10 ENCOUNTER — Ambulatory Visit: Payer: BC Managed Care – PPO

## 2022-12-10 DIAGNOSIS — M25661 Stiffness of right knee, not elsewhere classified: Secondary | ICD-10-CM | POA: Diagnosis not present

## 2022-12-10 DIAGNOSIS — M25561 Pain in right knee: Secondary | ICD-10-CM

## 2022-12-10 NOTE — Therapy (Signed)
OUTPATIENT PHYSICAL THERAPY LOWER EXTREMITY TREATMENT   Patient Name: Thomas Pham MRN: [id number] DOB:02-05-1956, 67 y.o., male Today's Date: 12/10/2022  END OF SESSION:  PT End of Session - 12/10/22 1018     Visit Number 7    Number of Visits 12    Date for PT Re-Evaluation 12/25/22    PT Start Time 1015    PT Stop Time 1100    PT Time Calculation (min) 45 min    Activity Tolerance Patient tolerated treatment well    Behavior During Therapy [facility] for tasks assessed/performed                   Past Medical History:  Diagnosis Date   Allergy    Arthritis    osteo   Basal cell carcinoma 09/13/2014   BCC NODULAR LEFT CHEEK PER ST [facility]   Basal cell carcinoma (BCC) 09/30/2020   SUP & NOD - R UPPER ABDOMEN (CX3FU)   Basal cell carcinoma of skin 09/14/2019   right malar cheek -MOHS   BCC (basal cell carcinoma of skin) 09/09/2017   BCC MICRONODULAR LEFT CHEEK CX3 AND EXC   BCC (basal cell carcinoma of skin) 09/09/2017   BCC POSITIVE MARGIN MOHS DR PEARCE   Chronic kidney disease    Difficulty sleeping    GERD (gastroesophageal reflux disease)    takes nexium for acid reflux as needed   History of gout    History of hiatal hernia    umbilical hernia   History of kidney stones    Hyperlipidemia    Hypertension    Hypothyroidism    Low back pain    Medial meniscus tear    LEFT   Organic impotence    Penile cyst    Sleep apnea    not using mask d/t preference   Thyroid disease    Past Surgical History:  Procedure Laterality Date   KNEE ARTHROSCOPY Left 03/07/2014   Procedure: LEFT ARTHROSCOPY KNEE WITH MEDIAL MENISCECTOMY  DEBRIDEMENT AND CHONDROPLASTY ;  Surgeon: Loanne Drilling, MD;  Location: [facility];  Service: Orthopedics;  Laterality: Left;   THYROIDECTOMY Right 2013   TONSILLECTOMY     childhood   TOTAL KNEE ARTHROPLASTY Left 03/04/2016   Procedure: LEFT TOTAL KNEE ARTHROPLASTY;  Surgeon: Ollen Gross, MD;  Location: [facility];  Service: Orthopedics;   Laterality: Left;   Patient Active Problem List   Diagnosis Date Noted   Low testosterone 06/26/2021   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   BPH without obstruction/lower urinary tract symptoms 06/26/2021   Severe obstructive sleep apnea-hypopnea syndrome 10/12/2018   OA (osteoarthritis) of knee 03/04/2016   Acute medial meniscal tear 03/06/2014   Cold hands and feet 08/23/2012   REFERRING PROVIDER: Ollen Gross, MD   REFERRING DIAG: Unilateral primary osteoarthritis, right knee   THERAPY DIAG:  Stiffness of right knee, not elsewhere classified  Acute pain of right knee  Rationale for Evaluation and Treatment: Rehabilitation  ONSET DATE: 11/25/22  SUBJECTIVE:   SUBJECTIVE STATEMENT: Patient reports that he wants to do as little as possible today as he is exhausted. He would like to do the bike, stretch his knee, and then put ice on it. He was told he needs to get 100 degrees of knee flexion before he can drive and he wants to begin working on that at his next appointment.   PERTINENT HISTORY: Osteoarthritis, allergies, chronic kidney disease, hypertension, and chronic low back pain PAIN:  Are you having pain? Yes: NPRS  scale: 4-5/10 Pain location: right knee Pain description: soreness Aggravating factors: using his knee immobilizers Relieving factors: walking, ice, and medication  PRECAUTIONS: None  RED FLAGS: None   WEIGHT BEARING RESTRICTIONS: No  FALLS:  Has patient fallen in last 6 months? No  LIVING ENVIRONMENT: Lives with: lives with their family Lives in: House/apartment Stairs: Yes: Internal: 2-12 steps; on right going up; "going up with the good and down with the bad"  Has following equipment at home: Dan Humphreys - 4 wheeled  OCCUPATION: TV producer  PLOF: Independent  PATIENT GOALS: be able to play basketball, be able to meet his exercise goals, be able to do yard work, and improved mobility  NEXT MD VISIT: 12/09/22  OBJECTIVE:  Note: Objective measures  were completed at Evaluation unless otherwise noted.  PATIENT SURVEYS:  FOTO 48.07  COGNITION: Overall cognitive status: Within functional limits for tasks assessed     SENSATION: Patient reports no numbness or tingling  EDEMA:  Unable to be assessed due to ace wrap and compression stockings   PALPATION: Mild TTP: right quadriceps, hamstrings, and hip adductors  LOWER EXTREMITY ROM:  Active ROM Right eval Right 12/07/22 Left eval  Hip flexion     Hip extension     Hip abduction     Hip adduction     Hip internal rotation     Hip external rotation     Knee flexion 89/95 (PROM)  80 130  Knee extension 12 9 0  Ankle dorsiflexion     Ankle plantarflexion     Ankle inversion     Ankle eversion      (Blank rows = not tested)  LOWER EXTREMITY MMT: not tested due to surgical condition  FUNCTIONAL MOBILITY:  Required modA with sit to supine and supine to sit transfers  GAIT: Assistive device utilized: Environmental consultant - 4 wheeled Level of assistance: Modified independence Comments: step to pattern with decreased stride length and right knee flexed in stance   TODAY'S TREATMENT:                                                                                                                              DATE:                                     12/10/22 EXERCISE LOG  Exercise Repetitions and Resistance Comments  Nustep L4 x 15 minutes; seat 10-9   Rocker board  5 minutes   Thomas stretch  3 minutes  RLE only   Supine heel slide 2 minutes  RLE only        Blank cell = exercise not performed today                                    12/09/22 EXERCISE LOG  Exercise Repetitions and Resistance Comments  Nustep L3 x 15 minutes; seat 10    Standing gastroc stretch  4 x 30 seconds   Standing HS stretch  4 x 30 seconds  RLE only   LAQ 2 minutes RLE only   Lunges onto step  6" step x 3 minutes RLE on step    Blank cell = exercise not performed today  Modalities: no redness or  adverse reaction to today's modalities  Date:  Vaso: Knee, 34 degrees; low pressure, 15 mins, Pain                                   12/07/22 EXERCISE LOG  Exercise Repetitions and Resistance Comments  Nustep  L4 x 15 minutes; seat 11-10   Quad set  15 reps w/ 5 second hold    Heel slide  15 reps    SLR  20 reps  Minimal right knee extension lag  Mini squat  25 reps  BUE support   Blank cell = exercise not performed today  Manual Therapy Soft Tissue Mobilization: right quadriceps and IT band, for reduced pain and improved soft tissue extensibility Joint Mobilizations: patellar, grade I-III Passive ROM: right knee flexion and extension, to tolerance   Modalities: no redness or adverse reaction to today's modalities  Date:  Unattended Estim: right quadriceps, pre mod @ 80-150 Hz, 15 mins, Pain and Tone Vaso: Knee, 34 degrees; low pressure, 15 mins, Pain and Edema  PATIENT EDUCATION:  Education details: Plan of care, healing, prognosis, objective findings, and goals for therapy Person educated: Patient Education method: Explanation Education comprehension: verbalized understanding  HOME EXERCISE PROGRAM:   ASSESSMENT:  CLINICAL IMPRESSION: Patient was introduced to a supine thomas stretch for improved knee mobility. He required minimal cueing with the thomas stretch for proper positioning to facilitate improved soft tissue extensibility. He experienced no significant increase in pain or discomfort with any of today's interventions. He reported that his knee felt "alright" upon the conclusion of treatment. He continues to require skilled physical therapy to address his remaining impairments to return to his prior level of function.   OBJECTIVE IMPAIRMENTS: Abnormal gait, decreased activity tolerance, decreased mobility, difficulty walking, decreased ROM, decreased strength, hypomobility, increased edema, impaired tone, and pain.   ACTIVITY LIMITATIONS: carrying, lifting, standing,  squatting, sleeping, stairs, transfers, bed mobility, locomotion level, and caring for others  PARTICIPATION LIMITATIONS: meal prep, cleaning, laundry, shopping, community activity, occupation, and yard work  PERSONAL FACTORS: Past/current experiences and 3+ comorbidities: Osteoarthritis, allergies, chronic kidney disease, hypertension, and chronic low back pain  are also affecting patient's functional outcome.   REHAB POTENTIAL: Good  CLINICAL DECISION MAKING: Stable/uncomplicated  EVALUATION COMPLEXITY: Low   GOALS: Goals reviewed with patient? Yes  LONG TERM GOALS: Target date: 12/25/22  Patient will be independent with his HEP. Baseline:  Goal status: MET  2.  Patient will be able to demonstrate at least 120 degrees of right knee flexion for improved function navigating stairs. Baseline: 70 degrees prior to pain Goal status: ON GOING  3.  Patient will be able to ambulate with no significant gait deviations. Baseline:  Goal status: ON GOING  4.  Patient will improve his right knee extension within 5 degrees of neutral for improved gait mechanics. Baseline: 6 degrees Goal status: ON GOING  5.  Patient will be able to navigate at least 4 steps with a reciprocal pattern for improved  household mobility. Baseline: step to pattern Goal status: ON GOING  PLAN:  PT FREQUENCY: 2-3x/week  PT DURATION: 4 weeks  PLANNED INTERVENTIONS: 97146- PT Re-evaluation, 97110-Therapeutic exercises, 97530- Therapeutic activity, 97112- Neuromuscular re-education, 97535- Self Care, 16109- Manual therapy, 97116- Gait training, 97014- Electrical stimulation (unattended), 97016- Vasopneumatic device, Patient/Family education, Balance training, Stair training, Joint mobilization, Cryotherapy, and Moist heat  PLAN FOR NEXT SESSION: NuStep, heel slides, quad sets, manual therapy, and modalities as needed   Granville Lewis, PT 12/10/2022, 11:04 AM

## 2022-12-14 ENCOUNTER — Ambulatory Visit: Payer: BC Managed Care – PPO

## 2022-12-14 DIAGNOSIS — M25661 Stiffness of right knee, not elsewhere classified: Secondary | ICD-10-CM

## 2022-12-14 DIAGNOSIS — M25561 Pain in right knee: Secondary | ICD-10-CM

## 2022-12-14 NOTE — Therapy (Signed)
OUTPATIENT PHYSICAL THERAPY LOWER EXTREMITY TREATMENT   Patient Name: Thomas Pham MRN: [id number] DOB:[birth year], 67 y.o., male Today's Date: 12/14/2022  END OF SESSION:  PT End of Session - 12/14/22 1023     Visit Number 8    Number of Visits 12    Date for PT Re-Evaluation 12/25/22    PT Start Time 1017    PT Stop Time 1100    PT Time Calculation (min) 43 min    Activity Tolerance Patient tolerated treatment well    Behavior During Therapy [facility] for tasks assessed/performed                    Past Medical History:  Diagnosis Date   Allergy    Arthritis    osteo   Basal cell carcinoma 09/13/2014   BCC NODULAR LEFT CHEEK PER ST CLEAR   Basal cell carcinoma (BCC) 09/30/2020   SUP & NOD - R UPPER ABDOMEN (CX3FU)   Basal cell carcinoma of skin 09/14/2019   right malar cheek -MOHS   BCC (basal cell carcinoma of skin) 09/09/2017   BCC MICRONODULAR LEFT CHEEK CX3 AND EXC   BCC (basal cell carcinoma of skin) 09/09/2017   BCC POSITIVE MARGIN MOHS DR PEARCE   Chronic kidney disease    Difficulty sleeping    GERD (gastroesophageal reflux disease)    takes nexium for acid reflux as needed   History of gout    History of hiatal hernia    umbilical hernia   History of kidney stones    Hyperlipidemia    Hypertension    Hypothyroidism    Low back pain    Medial meniscus tear    LEFT   Organic impotence    Penile cyst    Sleep apnea    not using mask d/t preference   Thyroid disease    Past Surgical History:  Procedure Laterality Date   KNEE ARTHROSCOPY Left 03/07/2014   Procedure: LEFT ARTHROSCOPY KNEE WITH MEDIAL MENISCECTOMY  DEBRIDEMENT AND CHONDROPLASTY ;  Surgeon: Loanne Drilling, MD;  Location: [facility];  Service: Orthopedics;  Laterality: Left;   THYROIDECTOMY Right 2013   TONSILLECTOMY     childhood   TOTAL KNEE ARTHROPLASTY Left 03/04/2016   Procedure: LEFT TOTAL KNEE ARTHROPLASTY;  Surgeon: Ollen Gross, MD;  Location: [facility];  Service:  Orthopedics;  Laterality: Left;   Patient Active Problem List   Diagnosis Date Noted   Low testosterone 06/26/2021   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   BPH without obstruction/lower urinary tract symptoms 06/26/2021   Severe obstructive sleep apnea-hypopnea syndrome 10/12/2018   OA (osteoarthritis) of knee 03/04/2016   Acute medial meniscal tear 03/06/2014   Cold hands and feet 08/23/2012   REFERRING PROVIDER: Ollen Gross, MD   REFERRING DIAG: Unilateral primary osteoarthritis, right knee   THERAPY DIAG:  Stiffness of right knee, not elsewhere classified  Acute pain of right knee  Rationale for Evaluation and Treatment: Rehabilitation  ONSET DATE: 11/25/22  SUBJECTIVE:   SUBJECTIVE STATEMENT: Patient reports that he rested over the weekend and his knee feels better. He notes that he is not quite strong enough to go up his steep stairs at home, but this helped his knee bend.   PERTINENT HISTORY: Osteoarthritis, allergies, chronic kidney disease, hypertension, and chronic low back pain PAIN:  Are you having pain? Yes: NPRS scale: 2/10 Pain location: right knee Pain description: soreness Aggravating factors: using his knee immobilizers Relieving factors: walking, ice, and medication  PRECAUTIONS:  None  RED FLAGS: None   WEIGHT BEARING RESTRICTIONS: No  FALLS:  Has patient fallen in last 6 months? No  LIVING ENVIRONMENT: Lives with: lives with their family Lives in: House/apartment Stairs: Yes: Internal: 2-12 steps; on right going up; "going up with the good and down with the bad"  Has following equipment at home: Dan Humphreys - 4 wheeled  OCCUPATION: TV producer  PLOF: Independent  PATIENT GOALS: be able to play basketball, be able to meet his exercise goals, be able to do yard work, and improved mobility  NEXT MD VISIT: 12/09/22  OBJECTIVE:  Note: Objective measures were completed at Evaluation unless otherwise noted.  PATIENT SURVEYS:  FOTO  48.07  COGNITION: Overall cognitive status: Within functional limits for tasks assessed     SENSATION: Patient reports no numbness or tingling  EDEMA:  Unable to be assessed due to ace wrap and compression stockings   PALPATION: Mild TTP: right quadriceps, hamstrings, and hip adductors  LOWER EXTREMITY ROM:  Active ROM Right eval Right 12/07/22 Left eval  Hip flexion     Hip extension     Hip abduction     Hip adduction     Hip internal rotation     Hip external rotation     Knee flexion 89/95 (PROM)  80 130  Knee extension 12 9 0  Ankle dorsiflexion     Ankle plantarflexion     Ankle inversion     Ankle eversion      (Blank rows = not tested)  LOWER EXTREMITY MMT: not tested due to surgical condition  FUNCTIONAL MOBILITY:  Required modA with sit to supine and supine to sit transfers  GAIT: Assistive device utilized: Environmental consultant - 4 wheeled Level of assistance: Modified independence Comments: step to pattern with decreased stride length and right knee flexed in stance   TODAY'S TREATMENT:                                                                                                                              DATE:                                     12/14/22 EXERCISE LOG  Exercise Repetitions and Resistance Comments  Nustep  L4 x 15 minutes; seat 10-7   Lunges onto step  14" step x 3 minutes RLE on step   Step up  6"-8" step x 15 reps each  RLE leading   Thomas stretch  4 x 30 seconds  RLE only   Supine heel slide 3 minutes  RLE only    Blank cell = exercise not performed today  Manual Therapy Soft Tissue Mobilization: right quadriceps and IT band, at end range knee flexion for improved soft tissue extensibility  Passive ROM: R knee flexion, to tolerance  12/10/22 EXERCISE LOG  Exercise Repetitions and Resistance Comments  Nustep L4 x 15 minutes; seat 10-9   Rocker board  5 minutes   Thomas stretch  3 minutes  RLE only    Supine heel slide 2 minutes  RLE only        Blank cell = exercise not performed today                                    12/09/22 EXERCISE LOG  Exercise Repetitions and Resistance Comments  Nustep L3 x 15 minutes; seat 10    Standing gastroc stretch  4 x 30 seconds   Standing HS stretch  4 x 30 seconds  RLE only   LAQ 2 minutes RLE only   Lunges onto step  6" step x 3 minutes RLE on step    Blank cell = exercise not performed today  Modalities: no redness or adverse reaction to today's modalities  Date:  Vaso: Knee, 34 degrees; low pressure, 15 mins, Pain  PATIENT EDUCATION:  Education details: Plan of care, healing, prognosis, objective findings, and goals for therapy Person educated: Patient Education method: Explanation Education comprehension: verbalized understanding  HOME EXERCISE PROGRAM:   ASSESSMENT:  CLINICAL IMPRESSION: Today's interventions focused on improved knee flexion through the use of new and familiar interventions. He required minimal cueing with today's interventions for proper exercise performance to facilitate improved knee mobility. Manual therapy focused on soft tissue mobilization and passive range of motion for improved knee flexion with good results. He reported that his knee felt better, but still tight upon the conclusion of treatment. He continues to require skilled physical therapy to address his remaining impairments to return to his prior level of function.    OBJECTIVE IMPAIRMENTS: Abnormal gait, decreased activity tolerance, decreased mobility, difficulty walking, decreased ROM, decreased strength, hypomobility, increased edema, impaired tone, and pain.   ACTIVITY LIMITATIONS: carrying, lifting, standing, squatting, sleeping, stairs, transfers, bed mobility, locomotion level, and caring for others  PARTICIPATION LIMITATIONS: meal prep, cleaning, laundry, shopping, community activity, occupation, and yard work  PERSONAL FACTORS: Past/current  experiences and 3+ comorbidities: Osteoarthritis, allergies, chronic kidney disease, hypertension, and chronic low back pain  are also affecting patient's functional outcome.   REHAB POTENTIAL: Good  CLINICAL DECISION MAKING: Stable/uncomplicated  EVALUATION COMPLEXITY: Low   GOALS: Goals reviewed with patient? Yes  LONG TERM GOALS: Target date: 12/25/22  Patient will be independent with his HEP. Baseline:  Goal status: MET  2.  Patient will be able to demonstrate at least 120 degrees of right knee flexion for improved function navigating stairs. Baseline: 70 degrees prior to pain Goal status: ON GOING  3.  Patient will be able to ambulate with no significant gait deviations. Baseline:  Goal status: ON GOING  4.  Patient will improve his right knee extension within 5 degrees of neutral for improved gait mechanics. Baseline: 6 degrees Goal status: ON GOING  5.  Patient will be able to navigate at least 4 steps with a reciprocal pattern for improved household mobility. Baseline: step to pattern Goal status: ON GOING  PLAN:  PT FREQUENCY: 2-3x/week  PT DURATION: 4 weeks  PLANNED INTERVENTIONS: 97146- PT Re-evaluation, 97110-Therapeutic exercises, 97530- Therapeutic activity, 97112- Neuromuscular re-education, 97535- Self Care, [id number]- Manual therapy, [id number]- Gait training, 97014- Electrical stimulation (unattended), 97016- Vasopneumatic device, Patient/Family education, Balance training, Stair training, Joint mobilization, Cryotherapy, and Moist heat  PLAN FOR  NEXT SESSION: NuStep, heel slides, quad sets, manual therapy, and modalities as needed   Granville Lewis, PT 12/14/2022, 12:09 PM

## 2022-12-16 ENCOUNTER — Ambulatory Visit: Payer: BC Managed Care – PPO

## 2022-12-16 DIAGNOSIS — M25561 Pain in right knee: Secondary | ICD-10-CM

## 2022-12-16 DIAGNOSIS — M25661 Stiffness of right knee, not elsewhere classified: Secondary | ICD-10-CM

## 2022-12-16 NOTE — Therapy (Signed)
OUTPATIENT PHYSICAL THERAPY LOWER EXTREMITY TREATMENT   Patient Name: Thomas Pham MRN: [id number] DOB:11-21-1955, 67 y.o., male Today's Date: 12/16/2022  END OF SESSION:  PT End of Session - 12/16/22 1434     Visit Number 9    Number of Visits 12    Date for PT Re-Evaluation 12/25/22    PT Start Time 1430    PT Stop Time 1515    PT Time Calculation (min) 45 min    Activity Tolerance Patient tolerated treatment well    Behavior During Therapy [facility] for tasks assessed/performed                     Past Medical History:  Diagnosis Date   Allergy    Arthritis    osteo   Basal cell carcinoma 09/13/2014   BCC NODULAR LEFT CHEEK PER ST CLE[facility]   Basal cell carcinoma (BCC) 09/30/2020   SUP & NOD - R UPPER ABDOMEN (CX3FU)   Basal cell carcinoma of skin 09/14/2019   right malar cheek -MOHS   BCC (basal cell carcinoma of skin) 09/09/2017   BCC MICRONODULAR LEFT CHEEK CX3 AND EXC   BCC (basal cell carcinoma of skin) 09/09/2017   BCC POSITIVE MARGIN MOHS DR PEARCE   Chronic kidney disease    Difficulty sleeping    GERD (gastroesophageal reflux disease)    takes nexium for acid reflux as needed   History of gout    History of hiatal hernia    umbilical hernia   History of kidney stones    Hyperlipidemia    Hypertension    Hypothyroidism    Low back pain    Medial meniscus tear    LEFT   Organic impotence    Penile cyst    Sleep apnea    not using mask d/t preference   Thyroid disease    Past Surgical History:  Procedure Laterality Date   KNEE ARTHROSCOPY Left 03/07/2014   Procedure: LEFT ARTHROSCOPY KNEE WITH MEDIAL MENISCECTOMY  DEBRIDEMENT AND CHONDROPLASTY ;  Surgeon: Loanne Drilling, MD;  Location: [facility];  Service: Orthopedics;  Laterality: Left;   THYROIDECTOMY Right 2013   TONSILLECTOMY     childhood   TOTAL KNEE ARTHROPLASTY Left 03/04/2016   Procedure: LEFT TOTAL KNEE ARTHROPLASTY;  Surgeon: Ollen Gross, MD;  Location: [facility];  Service:  Orthopedics;  Laterality: Left;   Patient Active Problem List   Diagnosis Date Noted   Low testosterone 06/26/2021   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   BPH without obstruction/lower urinary tract symptoms 06/26/2021   Severe obstructive sleep apnea-hypopnea syndrome 10/12/2018   OA (osteoarthritis) of knee 03/04/2016   Acute medial meniscal tear 03/06/2014   Cold hands and feet 08/23/2012   REFERRING PROVIDER: Ollen Gross, MD   REFERRING DIAG: Unilateral primary osteoarthritis, right knee   THERAPY DIAG:  Stiffness of right knee, not elsewhere classified  Acute pain of right knee  Rationale for Evaluation and Treatment: Rehabilitation  ONSET DATE: 11/25/22  SUBJECTIVE:   SUBJECTIVE STATEMENT: Patient reports that he had a slight setback Monday night because he woke up Tuesday morning "twisted up like a pretzel." He notes that his back was hurting so bad that he had to lay in bed almost all day. Due to this, he did his exercises really hard this morning and his knee is hurting a little more now.    PERTINENT HISTORY: Osteoarthritis, allergies, chronic kidney disease, hypertension, and chronic low back pain PAIN:  Are you having  pain? Yes: NPRS scale: 4/10 Pain location: right knee Pain description: soreness Aggravating factors: using his knee immobilizers Relieving factors: walking, ice, and medication  PRECAUTIONS: None  RED FLAGS: None   WEIGHT BEARING RESTRICTIONS: No  FALLS:  Has patient fallen in last 6 months? No  LIVING ENVIRONMENT: Lives with: lives with their family Lives in: House/apartment Stairs: Yes: Internal: 2-12 steps; on right going up; "going up with the good and down with the bad"  Has following equipment at home: Dan Humphreys - 4 wheeled  OCCUPATION: TV producer  PLOF: Independent  PATIENT GOALS: be able to play basketball, be able to meet his exercise goals, be able to do yard work, and improved mobility  NEXT MD VISIT: 12/29/22  OBJECTIVE:   Note: Objective measures were completed at Evaluation unless otherwise noted.  PATIENT SURVEYS:  FOTO 62.61 on 12/16/22  COGNITION: Overall cognitive status: Within functional limits for tasks assessed     SENSATION: Patient reports no numbness or tingling  EDEMA:  Unable to be assessed due to ace wrap and compression stockings   PALPATION: Mild TTP: right quadriceps, hamstrings, and hip adductors  LOWER EXTREMITY ROM:  Active ROM Right eval Right 12/07/22 Left eval  Hip flexion     Hip extension     Hip abduction     Hip adduction     Hip internal rotation     Hip external rotation     Knee flexion 89/95 (PROM)  80 130  Knee extension 12 9 0  Ankle dorsiflexion     Ankle plantarflexion     Ankle inversion     Ankle eversion      (Blank rows = not tested)  LOWER EXTREMITY MMT: not tested due to surgical condition  FUNCTIONAL MOBILITY:  Required modA with sit to supine and supine to sit transfers  GAIT: Assistive device utilized: Environmental consultant - 4 wheeled Level of assistance: Modified independence Comments: step to pattern with decreased stride length and right knee flexed in stance   TODAY'S TREATMENT:                                                                                                                              DATE:                                      EXERCISE LOG  Exercise Repetitions and Resistance Comments  Nustep  L4 x 15 minutes; seat 9-6   Rocker board 2.5 minutes  BUE support   Squats  20 reps  BUE support   Lunges onto step  14" step x 30 reps  RLE on step   Eccentric heel taps  4" step x 10 reps  R foot on step   Step up  Onto BOSU (ball up) x 20 reps  Leading with RLE   Seated HS curl  Green t-band  x 35 reps  RLE only   LAQ  4# x 3 minutes  RLE only   Standing hip extension  3 minutes  For right knee extension    Blank cell = exercise not performed today                                    12/14/22 EXERCISE LOG  Exercise  Repetitions and Resistance Comments  Nustep  L4 x 15 minutes; seat 10-7   Lunges onto step  14" step x 3 minutes RLE on step   Step up  6"-8" step x 15 reps each  RLE leading   Thomas stretch  4 x 30 seconds  RLE only   Supine heel slide 3 minutes  RLE only    Blank cell = exercise not performed today  Manual Therapy Soft Tissue Mobilization: right quadriceps and IT band, at end range knee flexion for improved soft tissue extensibility  Passive ROM: R knee flexion, to tolerance                                     12/10/22 EXERCISE LOG  Exercise Repetitions and Resistance Comments  Nustep L4 x 15 minutes; seat 10-9   Rocker board  5 minutes   Thomas stretch  3 minutes  RLE only   Supine heel slide 2 minutes  RLE only        Blank cell = exercise not performed today   PATIENT EDUCATION:  Education details: healing, expectations for soreness Person educated: Patient Education method: Explanation Education comprehension: verbalized understanding  HOME EXERCISE PROGRAM:   ASSESSMENT:  CLINICAL IMPRESSION: Patient was progressed with multiple new and familiar interventions for improved knee mobility. He required minimal cueing with standing hip extension to facilitate a posterior weight shift to promote right knee extension. He experienced no significant increase in pain or discomfort with any of today's interventions. He reported that his knee felt "really good" upon the conclusion of treatment. He continues to require skilled physical therapy to address is remaining impairments to return to his prior level of function.   OBJECTIVE IMPAIRMENTS: Abnormal gait, decreased activity tolerance, decreased mobility, difficulty walking, decreased ROM, decreased strength, hypomobility, increased edema, impaired tone, and pain.   ACTIVITY LIMITATIONS: carrying, lifting, standing, squatting, sleeping, stairs, transfers, bed mobility, locomotion level, and caring for others  PARTICIPATION  LIMITATIONS: meal prep, cleaning, laundry, shopping, community activity, occupation, and yard work  PERSONAL FACTORS: Past/current experiences and 3+ comorbidities: Osteoarthritis, allergies, chronic kidney disease, hypertension, and chronic low back pain  are also affecting patient's functional outcome.   REHAB POTENTIAL: Good  CLINICAL DECISION MAKING: Stable/uncomplicated  EVALUATION COMPLEXITY: Low   GOALS: Goals reviewed with patient? Yes  LONG TERM GOALS: Target date: 12/25/22  Patient will be independent with his HEP. Baseline:  Goal status: MET  2.  Patient will be able to demonstrate at least 120 degrees of right knee flexion for improved function navigating stairs. Baseline: 70 degrees prior to pain Goal status: ON GOING  3.  Patient will be able to ambulate with no significant gait deviations. Baseline:  Goal status: ON GOING  4.  Patient will improve his right knee extension within 5 degrees of neutral for improved gait mechanics. Baseline: 6 degrees Goal status: ON GOING  5.  Patient will be able to navigate  at least 4 steps with a reciprocal pattern for improved household mobility. Baseline: step to pattern Goal status: ON GOING  PLAN:  PT FREQUENCY: 2-3x/week  PT DURATION: 4 weeks  PLANNED INTERVENTIONS: 97146- PT Re-evaluation, 97110-Therapeutic exercises, 97530- Therapeutic activity, 97112- Neuromuscular re-education, 97535- Self Care, 25427- Manual therapy, 97116- Gait training, 97014- Electrical stimulation (unattended), 97016- Vasopneumatic device, Patient/Family education, Balance training, Stair training, Joint mobilization, Cryotherapy, and Moist heat  PLAN FOR NEXT SESSION: NuStep, heel slides, quad sets, manual therapy, and modalities as needed   Granville Lewis, PT 12/16/2022, 4:26 PM

## 2022-12-21 ENCOUNTER — Ambulatory Visit: Payer: BC Managed Care – PPO | Attending: Orthopedic Surgery

## 2022-12-21 DIAGNOSIS — M25661 Stiffness of right knee, not elsewhere classified: Secondary | ICD-10-CM | POA: Diagnosis present

## 2022-12-21 DIAGNOSIS — M25561 Pain in right knee: Secondary | ICD-10-CM | POA: Diagnosis present

## 2022-12-21 NOTE — Therapy (Signed)
OUTPATIENT PHYSICAL THERAPY LOWER EXTREMITY TREATMENT   Patient Name: Thomas Pham MRN: [id number] DOB:11-08-1955, 67 y.o., male Today's Date: 12/21/2022  END OF SESSION:  PT End of Session - 12/21/22 1257     Visit Number 10    Number of Visits 12    Date for PT Re-Evaluation 12/25/22    PT Start Time 1300    PT Stop Time 1346    PT Time Calculation (min) 46 min    Activity Tolerance Patient tolerated treatment well    Behavior During Therapy [facility] for tasks assessed/performed                      Past Medical History:  Diagnosis Date   Allergy    Arthritis    osteo   Basal cell carcinoma 09/13/2014   BCC NODULAR LEFT CHEEK PER ST CLEAR   Basal cell carcinoma (BCC) 09/30/2020   SUP & NOD - R UPPER ABDOMEN (CX3FU)   Basal cell carcinoma of skin 09/14/2019   right malar cheek -MOHS   BCC (basal cell carcinoma of skin) 09/09/2017   BCC MICRONODULAR LEFT CHEEK CX3 AND EXC   BCC (basal cell carcinoma of skin) 09/09/2017   BCC POSITIVE MARGIN MOHS DR PEARCE   Chronic kidney disease    Difficulty sleeping    GERD (gastroesophageal reflux disease)    takes nexium for acid reflux as needed   History of gout    History of hiatal hernia    umbilical hernia   History of kidney stones    Hyperlipidemia    Hypertension    Hypothyroidism    Low back pain    Medial meniscus tear    LEFT   Organic impotence    Penile cyst    Sleep apnea    not using mask d/t preference   Thyroid disease    Past Surgical History:  Procedure Laterality Date   KNEE ARTHROSCOPY Left 03/07/2014   Procedure: LEFT ARTHROSCOPY KNEE WITH MEDIAL MENISCECTOMY  DEBRIDEMENT AND CHONDROPLASTY ;  Surgeon: Loanne Drilling, MD;  Location: [facility];  Service: Orthopedics;  Laterality: Left;   THYROIDECTOMY Right 2013   TONSILLECTOMY     childhood   TOTAL KNEE ARTHROPLASTY Left 03/04/2016   Procedure: LEFT TOTAL KNEE ARTHROPLASTY;  Surgeon: Ollen Gross, MD;  Location: [facility];  Service:  Orthopedics;  Laterality: Left;   Patient Active Problem List   Diagnosis Date Noted   Low testosterone 06/26/2021   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   BPH without obstruction/lower urinary tract symptoms 06/26/2021   Severe obstructive sleep apnea-hypopnea syndrome 10/12/2018   OA (osteoarthritis) of knee 03/04/2016   Acute medial meniscal tear 03/06/2014   Cold hands and feet 08/23/2012   REFERRING PROVIDER: Ollen Gross, MD   REFERRING DIAG: Unilateral primary osteoarthritis, right knee   THERAPY DIAG:  Stiffness of right knee, not elsewhere classified  Acute pain of right knee  Rationale for Evaluation and Treatment: Rehabilitation  ONSET DATE: 11/25/22  SUBJECTIVE:   SUBJECTIVE STATEMENT: Patient reports that he had a stinging pain below his incision yesterday, but he is not feeling this today. He has noticed that he can go up stairs normally, but going down is still difficult.   PERTINENT HISTORY: Osteoarthritis, allergies, chronic kidney disease, hypertension, and chronic low back pain PAIN:  Are you having pain? Yes: NPRS scale: no pain score provided/10 Pain location: right knee Pain description: soreness Aggravating factors: using his knee immobilizers Relieving factors: walking, ice,  and medication  PRECAUTIONS: None  RED FLAGS: None   WEIGHT BEARING RESTRICTIONS: No  FALLS:  Has patient fallen in last 6 months? No  LIVING ENVIRONMENT: Lives with: lives with their family Lives in: House/apartment Stairs: Yes: Internal: 2-12 steps; on right going up; "going up with the good and down with the bad"  Has following equipment at home: Dan Humphreys - 4 wheeled  OCCUPATION: TV producer  PLOF: Independent  PATIENT GOALS: be able to play basketball, be able to meet his exercise goals, be able to do yard work, and improved mobility  NEXT MD VISIT: 12/29/22  OBJECTIVE:  Note: Objective measures were completed at Evaluation unless otherwise noted.  PATIENT  SURVEYS:  FOTO 62.61 on 12/16/22  COGNITION: Overall cognitive status: Within functional limits for tasks assessed     SENSATION: Patient reports no numbness or tingling  EDEMA:  Unable to be assessed due to ace wrap and compression stockings   PALPATION: Mild TTP: right quadriceps, hamstrings, and hip adductors  LOWER EXTREMITY ROM:  Active ROM Right eval Right 12/07/22 Right 12/21/22 Left eval  Hip flexion      Hip extension      Hip abduction      Hip adduction      Hip internal rotation      Hip external rotation      Knee flexion 89/95 (PROM)  80 108 130  Knee extension 12 9 6  0  Ankle dorsiflexion      Ankle plantarflexion      Ankle inversion      Ankle eversion       (Blank rows = not tested)  LOWER EXTREMITY MMT: not tested due to surgical condition  FUNCTIONAL MOBILITY:  Required modA with sit to supine and supine to sit transfers  GAIT: Assistive device utilized: Environmental consultant - 4 wheeled Level of assistance: Modified independence Comments: step to pattern with decreased stride length and right knee flexed in stance   TODAY'S TREATMENT:                                                                                                                              DATE:                                     12/21/22 EXERCISE LOG  Exercise Repetitions and Resistance Comments  Nustep  L4 x 15 minutes; seat 10-6   Rocker board  3 minutes  BUE support   Lunges onto step  14" step x 2.5 minutes  RLE on step for knee flexion  Eccentric heel tap  6" step x 20 reps  RLE only   Cybex knee flexion  40# x 3 minutes    Cybex knee extension 20# x 2 minutes    Blank cell = exercise not performed today  Manual Therapy Soft Tissue Mobilization: right quadriceps, for improved  soft tissue extensibility Joint Mobilizations: right patella and tibiofemoral, grade I-IV  Passive ROM: right knee flexion and extension, to tolerance                                     12/16/22  EXERCISE LOG  Exercise Repetitions and Resistance Comments  Nustep  L4 x 15 minutes; seat 9-6   Rocker board 2.5 minutes  BUE support   Squats  20 reps  BUE support   Lunges onto step  14" step x 30 reps  RLE on step   Eccentric heel taps  4" step x 10 reps  R foot on step   Step up  Onto BOSU (ball up) x 20 reps  Leading with RLE   Seated HS curl  Green t-band x 35 reps  RLE only   LAQ  4# x 3 minutes  RLE only   Standing hip extension  3 minutes  For right knee extension    Blank cell = exercise not performed today                                    12/14/22 EXERCISE LOG  Exercise Repetitions and Resistance Comments  Nustep  L4 x 15 minutes; seat 10-7   Lunges onto step  14" step x 3 minutes RLE on step   Step up  6"-8" step x 15 reps each  RLE leading   Thomas stretch  4 x 30 seconds  RLE only   Supine heel slide 3 minutes  RLE only    Blank cell = exercise not performed today  Manual Therapy Soft Tissue Mobilization: right quadriceps and IT band, at end range knee flexion for improved soft tissue extensibility  Passive ROM: R knee flexion, to tolerance    PATIENT EDUCATION:  Education details: healing, expectations for soreness Person educated: Patient Education method: Explanation Education comprehension: verbalized understanding  HOME EXERCISE PROGRAM:   ASSESSMENT:  CLINICAL IMPRESSION: Today's treatment focused on improved right knee mobility and strength for improved function descending stairs. He required minimal cueing with eccentric heel taps to facilitate left ankle dorsiflexion to promote increased right eccentric quadriceps strength. He was able to demonstrate improved right knee mobility since his last progress report on 12/09/22. He experienced no significant increase in pain or discomfort with any of today's interventions. He reported that his knee felt "tired" upon the conclusion of treatment. He continues to require skilled physical therapy to address his  remaining impairments to return to his prior level of function.   OBJECTIVE IMPAIRMENTS: Abnormal gait, decreased activity tolerance, decreased mobility, difficulty walking, decreased ROM, decreased strength, hypomobility, increased edema, impaired tone, and pain.   ACTIVITY LIMITATIONS: carrying, lifting, standing, squatting, sleeping, stairs, transfers, bed mobility, locomotion level, and caring for others  PARTICIPATION LIMITATIONS: meal prep, cleaning, laundry, shopping, community activity, occupation, and yard work  PERSONAL FACTORS: Past/current experiences and 3+ comorbidities: Osteoarthritis, allergies, chronic kidney disease, hypertension, and chronic low back pain  are also affecting patient's functional outcome.   REHAB POTENTIAL: Good  CLINICAL DECISION MAKING: Stable/uncomplicated  EVALUATION COMPLEXITY: Low   GOALS: Goals reviewed with patient? Yes  LONG TERM GOALS: Target date: 12/25/22  Patient will be independent with his HEP. Baseline:  Goal status: MET  2.  Patient will be able to demonstrate at least 120 degrees of  right knee flexion for improved function navigating stairs. Baseline: 108 degrees on 12/21/22 Goal status: ON GOING  3.  Patient will be able to ambulate with no significant gait deviations. Baseline:  Goal status: ON GOING  4.  Patient will improve his right knee extension within 5 degrees of neutral for improved gait mechanics. Baseline: 6 degrees on 12/21/22 Goal status: ON GOING  5.  Patient will be able to navigate at least 4 steps with a reciprocal pattern for improved household mobility. Baseline: step to pattern descending with reciprocal pattern ascending Goal status: PARTIALLY MET  PLAN:  PT FREQUENCY: 2-3x/week  PT DURATION: 4 weeks  PLANNED INTERVENTIONS: 97146- PT Re-evaluation, 97110-Therapeutic exercises, 97530- Therapeutic activity, 97112- Neuromuscular re-education, 97535- Self Care, 16109- Manual therapy, 97116- Gait  training, 97014- Electrical stimulation (unattended), 97016- Vasopneumatic device, Patient/Family education, Balance training, Stair training, Joint mobilization, Cryotherapy, and Moist heat  PLAN FOR NEXT SESSION: NuStep, heel slides, quad sets, manual therapy, and modalities as needed   Granville Lewis, PT 12/21/2022, 3:14 PM

## 2022-12-24 ENCOUNTER — Ambulatory Visit: Payer: BC Managed Care – PPO

## 2022-12-24 DIAGNOSIS — M25661 Stiffness of right knee, not elsewhere classified: Secondary | ICD-10-CM | POA: Diagnosis not present

## 2022-12-24 DIAGNOSIS — M25561 Pain in right knee: Secondary | ICD-10-CM

## 2022-12-24 NOTE — Therapy (Signed)
OUTPATIENT PHYSICAL THERAPY LOWER EXTREMITY TREATMENT   Patient Name: Thomas Pham MRN: [id number] DOB:02-07-1956, 67 y.o., male Today's Date: 12/24/2022  END OF SESSION:  PT End of Session - 12/24/22 1438     Visit Number 11    Number of Visits 12    Date for PT Re-Evaluation 12/25/22    PT Start Time 1430    PT Stop Time 1512    PT Time Calculation (min) 42 min    Activity Tolerance Patient tolerated treatment well    Behavior During Therapy [facility] for tasks assessed/performed                       Past Medical History:  Diagnosis Date   Allergy    Arthritis    osteo   Basal cell carcinoma 09/13/2014   BCC NODULAR LEFT CHEEK PER ST CLEAR   Basal cell carcinoma (BCC) 09/30/2020   SUP & NOD - R UPPER ABDOMEN (CX3FU)   Basal cell carcinoma of skin 09/14/2019   right malar cheek -MOHS   BCC (basal cell carcinoma of skin) 09/09/2017   BCC MICRONODULAR LEFT CHEEK CX3 AND EXC   BCC (basal cell carcinoma of skin) 09/09/2017   BCC POSITIVE MARGIN MOHS DR PEARCE   Chronic kidney disease    Difficulty sleeping    GERD (gastroesophageal reflux disease)    takes nexium for acid reflux as needed   History of gout    History of hiatal hernia    umbilical hernia   History of kidney stones    Hyperlipidemia    Hypertension    Hypothyroidism    Low back pain    Medial meniscus tear    LEFT   Organic impotence    Penile cyst    Sleep apnea    not using mask d/t preference   Thyroid disease    Past Surgical History:  Procedure Laterality Date   KNEE ARTHROSCOPY Left 03/07/2014   Procedure: LEFT ARTHROSCOPY KNEE WITH MEDIAL MENISCECTOMY  DEBRIDEMENT AND CHONDROPLASTY ;  Surgeon: Loanne Drilling, MD;  Location: [facility];  Service: Orthopedics;  Laterality: Left;   THYROIDECTOMY Right 2013   TONSILLECTOMY     childhood   TOTAL KNEE ARTHROPLASTY Left 03/04/2016   Procedure: LEFT TOTAL KNEE ARTHROPLASTY;  Surgeon: Ollen Gross, MD;  Location: [facility];  Service:  Orthopedics;  Laterality: Left;   Patient Active Problem List   Diagnosis Date Noted   Low testosterone 06/26/2021   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   BPH without obstruction/lower urinary tract symptoms 06/26/2021   Severe obstructive sleep apnea-hypopnea syndrome 10/12/2018   OA (osteoarthritis) of knee 03/04/2016   Acute medial meniscal tear 03/06/2014   Cold hands and feet 08/23/2012   REFERRING PROVIDER: Ollen Gross, MD   REFERRING DIAG: Unilateral primary osteoarthritis, right knee   THERAPY DIAG:  Stiffness of right knee, not elsewhere classified  Acute pain of right knee  Rationale for Evaluation and Treatment: Rehabilitation  ONSET DATE: 11/25/22  SUBJECTIVE:   SUBJECTIVE STATEMENT: Patient reports that his back is bothering him more than his knee today. He notes that his back kept him up most of the night and he does not feel like doing much today.   PERTINENT HISTORY: Osteoarthritis, allergies, chronic kidney disease, hypertension, and chronic low back pain PAIN:  Are you having pain? Yes: NPRS scale: no pain score provided/10 Pain location: right knee Pain description: soreness Aggravating factors: using his knee immobilizers Relieving factors: walking, ice,  and medication  PRECAUTIONS: None  RED FLAGS: None   WEIGHT BEARING RESTRICTIONS: No  FALLS:  Has patient fallen in last 6 months? No  LIVING ENVIRONMENT: Lives with: lives with their family Lives in: House/apartment Stairs: Yes: Internal: 2-12 steps; on right going up; "going up with the good and down with the bad"  Has following equipment at home: Dan Humphreys - 4 wheeled  OCCUPATION: TV producer  PLOF: Independent  PATIENT GOALS: be able to play basketball, be able to meet his exercise goals, be able to do yard work, and improved mobility  NEXT MD VISIT: 12/29/22  OBJECTIVE:  Note: Objective measures were completed at Evaluation unless otherwise noted.  PATIENT SURVEYS:  FOTO 62.61 on  12/16/22  COGNITION: Overall cognitive status: Within functional limits for tasks assessed     SENSATION: Patient reports no numbness or tingling  EDEMA:  Unable to be assessed due to ace wrap and compression stockings   PALPATION: Mild TTP: right quadriceps, hamstrings, and hip adductors  LOWER EXTREMITY ROM:  Active ROM Right eval Right 12/07/22 Right 12/21/22 Left eval  Hip flexion      Hip extension      Hip abduction      Hip adduction      Hip internal rotation      Hip external rotation      Knee flexion 89/95 (PROM)  80 108 130  Knee extension 12 9 6  0  Ankle dorsiflexion      Ankle plantarflexion      Ankle inversion      Ankle eversion       (Blank rows = not tested)  LOWER EXTREMITY MMT: not tested due to surgical condition  FUNCTIONAL MOBILITY:  Required modA with sit to supine and supine to sit transfers  GAIT: Assistive device utilized: Environmental consultant - 4 wheeled Level of assistance: Modified independence Comments: step to pattern with decreased stride length and right knee flexed in stance   TODAY'S TREATMENT:                                                                                                                              DATE:                                     12/24/22 EXERCISE LOG  Exercise Repetitions and Resistance Comments  Nustep  L4 x 15 minutes; seat 8-6   Rocker board  5 minutes   Lunges onto step  14" step x 4 minutes  RLE on step for knee flexion   Cybex knee flexion  40# x 4 minutes    Cybex knee extension  20# x 2.5 minutes    Tandem stance on foam  3 x 30 seconds each  Intermittent UE support  Sit to stand  20 reps  Standard height chair without UE support    Blank  cell = exercise not performed today                                    12/21/22 EXERCISE LOG  Exercise Repetitions and Resistance Comments  Nustep  L4 x 15 minutes; seat 10-6   Rocker board  3 minutes  BUE support   Lunges onto step  14" step x 2.5 minutes  RLE  on step for knee flexion  Eccentric heel tap  6" step x 20 reps  RLE only   Cybex knee flexion  40# x 3 minutes    Cybex knee extension 20# x 2 minutes    Blank cell = exercise not performed today  Manual Therapy Soft Tissue Mobilization: right quadriceps, for improved soft tissue extensibility Joint Mobilizations: right patella and tibiofemoral, grade I-IV  Passive ROM: right knee flexion and extension, to tolerance                                     12/16/22 EXERCISE LOG  Exercise Repetitions and Resistance Comments  Nustep  L4 x 15 minutes; seat 9-6   Rocker board 2.5 minutes  BUE support   Squats  20 reps  BUE support   Lunges onto step  14" step x 30 reps  RLE on step   Eccentric heel taps  4" step x 10 reps  R foot on step   Step up  Onto BOSU (ball up) x 20 reps  Leading with RLE   Seated HS curl  Green t-band x 35 reps  RLE only   LAQ  4# x 3 minutes  RLE only   Standing hip extension  3 minutes  For right knee extension    Blank cell = exercise not performed today   PATIENT EDUCATION:  Education details: healing, expectations for soreness Person educated: Patient Education method: Explanation Education comprehension: verbalized understanding  HOME EXERCISE PROGRAM:   ASSESSMENT:  CLINICAL IMPRESSION: Today's treatment focused on familiar interventions for improved knee mobility. He required minimal cueing with today's interventions for proper exercise performance and pacing. His low back pain limited his ability to be introduced to new interventions for improved functional mobility. He reported that his knee was "hurting a little bit" upon the conclusion of treatment. Recommend that he continue to require skilled physical therapy to address his remaining impairments to his prior level of function.   OBJECTIVE IMPAIRMENTS: Abnormal gait, decreased activity tolerance, decreased mobility, difficulty walking, decreased ROM, decreased strength, hypomobility, increased  edema, impaired tone, and pain.   ACTIVITY LIMITATIONS: carrying, lifting, standing, squatting, sleeping, stairs, transfers, bed mobility, locomotion level, and caring for others  PARTICIPATION LIMITATIONS: meal prep, cleaning, laundry, shopping, community activity, occupation, and yard work  PERSONAL FACTORS: Past/current experiences and 3+ comorbidities: Osteoarthritis, allergies, chronic kidney disease, hypertension, and chronic low back pain  are also affecting patient's functional outcome.   REHAB POTENTIAL: Good  CLINICAL DECISION MAKING: Stable/uncomplicated  EVALUATION COMPLEXITY: Low   GOALS: Goals reviewed with patient? Yes  LONG TERM GOALS: Target date: 12/25/22  Patient will be independent with his HEP. Baseline:  Goal status: MET  2.  Patient will be able to demonstrate at least 120 degrees of right knee flexion for improved function navigating stairs. Baseline: 108 degrees on 12/21/22 Goal status: ON GOING  3.  Patient will be able to  ambulate with no significant gait deviations. Baseline:  Goal status: ON GOING  4.  Patient will improve his right knee extension within 5 degrees of neutral for improved gait mechanics. Baseline: 6 degrees on 12/21/22 Goal status: ON GOING  5.  Patient will be able to navigate at least 4 steps with a reciprocal pattern for improved household mobility. Baseline: step to pattern descending with reciprocal pattern ascending Goal status: PARTIALLY MET  PLAN:  PT FREQUENCY: 2-3x/week  PT DURATION: 4 weeks  PLANNED INTERVENTIONS: 97146- PT Re-evaluation, 97110-Therapeutic exercises, 97530- Therapeutic activity, 97112- Neuromuscular re-education, 97535- Self Care, 53664- Manual therapy, 97116- Gait training, 97014- Electrical stimulation (unattended), 97016- Vasopneumatic device, Patient/Family education, Balance training, Stair training, Joint mobilization, Cryotherapy, and Moist heat  PLAN FOR NEXT SESSION: NuStep, heel slides,  quad sets, manual therapy, and modalities as needed   Granville Lewis, PT 12/24/2022, 3:13 PM

## 2022-12-28 ENCOUNTER — Ambulatory Visit: Payer: BC Managed Care – PPO

## 2022-12-28 DIAGNOSIS — M25661 Stiffness of right knee, not elsewhere classified: Secondary | ICD-10-CM | POA: Diagnosis not present

## 2022-12-28 DIAGNOSIS — M25561 Pain in right knee: Secondary | ICD-10-CM

## 2022-12-28 NOTE — Therapy (Signed)
OUTPATIENT PHYSICAL THERAPY LOWER EXTREMITY TREATMENT   Patient Name: Thomas Pham MRN: [id number] DOB:[birth year], 67 y.o., male Today's Date: 12/28/2022  END OF SESSION:  PT End of Session - 12/28/22 1350     Visit Number 12    Number of Visits 16    Date for PT Re-Evaluation 01/15/23    PT Start Time 1345    PT Stop Time 1425    PT Time Calculation (min) 40 min    Activity Tolerance Patient tolerated treatment well    Behavior During Therapy [facility] for tasks assessed/performed                        Past Medical History:  Diagnosis Date   Allergy    Arthritis    osteo   Basal cell carcinoma 09/13/2014   BCC NODULAR LEFT CHEEK PER ST CLEAR   Basal cell carcinoma (BCC) 09/30/2020   SUP & NOD - R UPPER ABDOMEN (CX3FU)   Basal cell carcinoma of skin 09/14/2019   right malar cheek -MOHS   BCC (basal cell carcinoma of skin) 09/09/2017   BCC MICRONODULAR LEFT CHEEK CX3 AND EXC   BCC (basal cell carcinoma of skin) 09/09/2017   BCC POSITIVE MARGIN MOHS DR PEARCE   Chronic kidney disease    Difficulty sleeping    GERD (gastroesophageal reflux disease)    takes nexium for acid reflux as needed   History of gout    History of hiatal hernia    umbilical hernia   History of kidney stones    Hyperlipidemia    Hypertension    Hypothyroidism    Low back pain    Medial meniscus tear    LEFT   Organic impotence    Penile cyst    Sleep apnea    not using mask d/t preference   Thyroid disease    Past Surgical History:  Procedure Laterality Date   KNEE ARTHROSCOPY Left 03/07/2014   Procedure: LEFT ARTHROSCOPY KNEE WITH MEDIAL MENISCECTOMY  DEBRIDEMENT AND CHONDROPLASTY ;  Surgeon: Loanne Drilling, MD;  Location: [facility];  Service: Orthopedics;  Laterality: Left;   THYROIDECTOMY Right 2013   TONSILLECTOMY     childhood   TOTAL KNEE ARTHROPLASTY Left 03/04/2016   Procedure: LEFT TOTAL KNEE ARTHROPLASTY;  Surgeon: Ollen Gross, MD;  Location: [facility];  Service:  Orthopedics;  Laterality: Left;   Patient Active Problem List   Diagnosis Date Noted   Low testosterone 06/26/2021   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   BPH without obstruction/lower urinary tract symptoms 06/26/2021   Severe obstructive sleep apnea-hypopnea syndrome 10/12/2018   OA (osteoarthritis) of knee 03/04/2016   Acute medial meniscal tear 03/06/2014   Cold hands and feet 08/23/2012   REFERRING PROVIDER: Ollen Gross, MD   REFERRING DIAG: Unilateral primary osteoarthritis, right knee   THERAPY DIAG:  Stiffness of right knee, not elsewhere classified  Acute pain of right knee  Rationale for Evaluation and Treatment: Rehabilitation  ONSET DATE: 11/25/22  SUBJECTIVE:   SUBJECTIVE STATEMENT: Patient reports that he has been working on his knee quite a bit over the weekend. He has noticed that his knee is getting better as he can go down stairs a little easier. He sees the physician assistant tomorrow.   PERTINENT HISTORY: Osteoarthritis, allergies, chronic kidney disease, hypertension, and chronic low back pain PAIN:  Are you having pain? Yes: NPRS scale: no pain score provided/10 Pain location: right knee Pain description: soreness Aggravating factors: using  his knee immobilizers Relieving factors: walking, ice, and medication  PRECAUTIONS: None  RED FLAGS: None   WEIGHT BEARING RESTRICTIONS: No  FALLS:  Has patient fallen in last 6 months? No  LIVING ENVIRONMENT: Lives with: lives with their family Lives in: House/apartment Stairs: Yes: Internal: 2-12 steps; on right going up; "going up with the good and down with the bad"  Has following equipment at home: Dan Humphreys - 4 wheeled  OCCUPATION: TV producer  PLOF: Independent  PATIENT GOALS: be able to play basketball, be able to meet his exercise goals, be able to do yard work, and improved mobility  NEXT MD VISIT: 12/29/22  OBJECTIVE:  Note: Objective measures were completed at Evaluation unless otherwise  noted.  PATIENT SURVEYS:  FOTO 69.15 on 12/28/22  COGNITION: Overall cognitive status: Within functional limits for tasks assessed     SENSATION: Patient reports no numbness or tingling  EDEMA:  Unable to be assessed due to ace wrap and compression stockings   PALPATION: Mild TTP: right quadriceps, hamstrings, and hip adductors  LOWER EXTREMITY ROM:  Active ROM Right eval Right 12/07/22 Right 12/21/22 Right 12/28/22 Left eval  Hip flexion       Hip extension       Hip abduction       Hip adduction       Hip internal rotation       Hip external rotation       Knee flexion 89/95 (PROM)  80 108 117 130  Knee extension 12 9 6 5  0  Ankle dorsiflexion       Ankle plantarflexion       Ankle inversion       Ankle eversion        (Blank rows = not tested)  LOWER EXTREMITY MMT: not tested due to surgical condition  FUNCTIONAL MOBILITY:  Required modA with sit to supine and supine to sit transfers  GAIT: Assistive device utilized: Environmental consultant - 4 wheeled Level of assistance: Modified independence Comments: step to pattern with decreased stride length and right knee flexed in stance   TODAY'S TREATMENT:                                                                                                                              DATE:                                     12/28/22 EXERCISE LOG  Exercise Repetitions and Resistance Comments  Nustep  L4 x 15 minutes; seat 7-5   Rocker board  3 minutes    Lunges onto step  14" step x 25 reps  RLE on step for knee flexion   Cybex knee flexion  40# x 1.5 minutes progressing to 50# x 1.5 minutes    Cybex knee extension 30# x 2 x 25 reps     Blank cell =  exercise not performed today                                    12/24/22 EXERCISE LOG  Exercise Repetitions and Resistance Comments  Nustep  L4 x 15 minutes; seat 8-6   Rocker board  5 minutes   Lunges onto step  14" step x 4 minutes  RLE on step for knee flexion   Cybex knee  flexion  40# x 4 minutes    Cybex knee extension  20# x 2.5 minutes    Tandem stance on foam  3 x 30 seconds each  Intermittent UE support  Sit to stand  20 reps  Standard height chair without UE support    Blank cell = exercise not performed today                                    12/21/22 EXERCISE LOG  Exercise Repetitions and Resistance Comments  Nustep  L4 x 15 minutes; seat 10-6   Rocker board  3 minutes  BUE support   Lunges onto step  14" step x 2.5 minutes  RLE on step for knee flexion  Eccentric heel tap  6" step x 20 reps  RLE only   Cybex knee flexion  40# x 3 minutes    Cybex knee extension 20# x 2 minutes    Blank cell = exercise not performed today  Manual Therapy Soft Tissue Mobilization: right quadriceps, for improved soft tissue extensibility Joint Mobilizations: right patella and tibiofemoral, grade I-IV  Passive ROM: right knee flexion and extension, to tolerance    PATIENT EDUCATION:  Education details: healing, expectations for soreness, objective measurement, and progress with therapy Person educated: Patient Education method: Explanation Education comprehension: verbalized understanding  HOME EXERCISE PROGRAM:   ASSESSMENT:  CLINICAL IMPRESSION: Patient is making good progress with skilled physical therapy as evidenced by his subjective reports, objective measures, functional mobility, and progress toward his goals. He was able to demonstrate a significant improvement in his right knee mobility and function navigating stairs. He was able to meet his knee extension goal (within 5 degrees of neutral) and he has nearly met (117 degrees) his goal for 120 degrees of knee flexion. However, he continues to exhibit mild gait deviations while ambulating such as a wide base of support and minimal right knee mobility while walking. Today's treatment focused on familiar interventions for improved lower extremity strength needed for improved functional mobility. Recommend  that he continue with skilled physical therapy for four additional visits to address his remaining impairments to return to his prior level of function.   OBJECTIVE IMPAIRMENTS: Abnormal gait, decreased activity tolerance, decreased mobility, difficulty walking, decreased ROM, decreased strength, hypomobility, increased edema, impaired tone, and pain.   ACTIVITY LIMITATIONS: carrying, lifting, standing, squatting, sleeping, stairs, transfers, bed mobility, locomotion level, and caring for others  PARTICIPATION LIMITATIONS: meal prep, cleaning, laundry, shopping, community activity, occupation, and yard work  PERSONAL FACTORS: Past/current experiences and 3+ comorbidities: Osteoarthritis, allergies, chronic kidney disease, hypertension, and chronic low back pain  are also affecting patient's functional outcome.   REHAB POTENTIAL: Good  CLINICAL DECISION MAKING: Stable/uncomplicated  EVALUATION COMPLEXITY: Low   GOALS: Goals reviewed with patient? Yes  LONG TERM GOALS: Target date: 12/25/22  Patient will be independent with his HEP. Baseline:  Goal status: MET  2.  Patient will be able to demonstrate at least 120 degrees of right knee flexion for improved function navigating stairs. Baseline: 108 degrees on 12/21/22; 117 degrees on 12/28/22 Goal status: ON GOING  3.  Patient will be able to ambulate with no significant gait deviations. Baseline:  Goal status: ON GOING  4.  Patient will improve his right knee extension within 5 degrees of neutral for improved gait mechanics. Baseline: 6 degrees on 12/21/22; 5 degrees on 12/28/22 Goal status: MET  5.  Patient will be able to navigate at least 4 steps with a reciprocal pattern for improved household mobility. Baseline: reciprocal pattern ascending and descending stairs Goal status: MET  PLAN:  PT FREQUENCY: 2-3x/week  PT DURATION: 4 weeks  PLANNED INTERVENTIONS: 97146- PT Re-evaluation, 97110-Therapeutic exercises, 97530-  Therapeutic activity, 97112- Neuromuscular re-education, 97535- Self Care, 55732- Manual therapy, 97116- Gait training, 97014- Electrical stimulation (unattended), 97016- Vasopneumatic device, Patient/Family education, Balance training, Stair training, Joint mobilization, Cryotherapy, and Moist heat  PLAN FOR NEXT SESSION: NuStep, heel slides, quad sets, manual therapy, and modalities as needed   Granville Lewis, PT 12/28/2022, 2:38 PM

## 2022-12-31 ENCOUNTER — Ambulatory Visit: Payer: BC Managed Care – PPO

## 2022-12-31 DIAGNOSIS — M25661 Stiffness of right knee, not elsewhere classified: Secondary | ICD-10-CM | POA: Diagnosis not present

## 2022-12-31 DIAGNOSIS — M25561 Pain in right knee: Secondary | ICD-10-CM

## 2022-12-31 NOTE — Therapy (Signed)
OUTPATIENT PHYSICAL THERAPY LOWER EXTREMITY TREATMENT   Patient Name: Thomas Pham MRN: [id number] DOB:01-Dec-1955, 67 y.o., male Today's Date: 12/31/2022  END OF SESSION:  PT End of Session - 12/31/22 1344     Visit Number 13    Number of Visits 16    Date for PT Re-Evaluation 01/15/23    PT Start Time 1345    PT Stop Time 1426    PT Time Calculation (min) 41 min    Activity Tolerance Patient tolerated treatment well    Behavior During Therapy [facility] for tasks assessed/performed                         Past Medical History:  Diagnosis Date   Allergy    Arthritis    osteo   Basal cell carcinoma 09/13/2014   BCC NODULAR LEFT CHEEK PER ST [facility]AR   Basal cell carcinoma (BCC) 09/30/2020   SUP & NOD - R UPPER ABDOMEN (CX3FU)   Basal cell carcinoma of skin 09/14/2019   right malar cheek -MOHS   BCC (basal cell carcinoma of skin) 09/09/2017   BCC MICRONODULAR LEFT CHEEK CX3 AND EXC   BCC (basal cell carcinoma of skin) 09/09/2017   BCC POSITIVE MARGIN MOHS DR PEARCE   Chronic kidney disease    Difficulty sleeping    GERD (gastroesophageal reflux disease)    takes nexium for acid reflux as needed   History of gout    History of hiatal hernia    umbilical hernia   History of kidney stones    Hyperlipidemia    Hypertension    Hypothyroidism    Low back pain    Medial meniscus tear    LEFT   Organic impotence    Penile cyst    Sleep apnea    not using mask d/t preference   Thyroid disease    Past Surgical History:  Procedure Laterality Date   KNEE ARTHROSCOPY Left 03/07/2014   Procedure: LEFT ARTHROSCOPY KNEE WITH MEDIAL MENISCECTOMY  DEBRIDEMENT AND CHONDROPLASTY ;  Surgeon: Loanne Drilling, MD;  Location: [facility];  Service: Orthopedics;  Laterality: Left;   THYROIDECTOMY Right 2013   TONSILLECTOMY     childhood   TOTAL KNEE ARTHROPLASTY Left 03/04/2016   Procedure: LEFT TOTAL KNEE ARTHROPLASTY;  Surgeon: Ollen Gross, MD;  Location: [facility];  Service:  Orthopedics;  Laterality: Left;   Patient Active Problem List   Diagnosis Date Noted   Low testosterone 06/26/2021   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   BPH without obstruction/lower urinary tract symptoms 06/26/2021   Severe obstructive sleep apnea-hypopnea syndrome 10/12/2018   OA (osteoarthritis) of knee 03/04/2016   Acute medial meniscal tear 03/06/2014   Cold hands and feet 08/23/2012   REFERRING PROVIDER: Ollen Gross, MD   REFERRING DIAG: Unilateral primary osteoarthritis, right knee   THERAPY DIAG:  Stiffness of right knee, not elsewhere classified  Acute pain of right knee  Rationale for Evaluation and Treatment: Rehabilitation  ONSET DATE: 11/25/22  SUBJECTIVE:   SUBJECTIVE STATEMENT: Patient reports that his knee is the least of his worries today.   PERTINENT HISTORY: Osteoarthritis, allergies, chronic kidney disease, hypertension, and chronic low back pain PAIN:  Are you having pain? Yes: NPRS scale: no pain score provided/10 Pain location: right knee Pain description: soreness Aggravating factors: using his knee immobilizers Relieving factors: walking, ice, and medication  PRECAUTIONS: None  RED FLAGS: None   WEIGHT BEARING RESTRICTIONS: No  FALLS:  Has patient  fallen in last 6 months? No  LIVING ENVIRONMENT: Lives with: lives with their family Lives in: House/apartment Stairs: Yes: Internal: 2-12 steps; on right going up; "going up with the good and down with the bad"  Has following equipment at home: Dan Humphreys - 4 wheeled  OCCUPATION: TV producer  PLOF: Independent  PATIENT GOALS: be able to play basketball, be able to meet his exercise goals, be able to do yard work, and improved mobility  NEXT MD VISIT: 12/29/22  OBJECTIVE:  Note: Objective measures were completed at Evaluation unless otherwise noted.  PATIENT SURVEYS:  FOTO 69.15 on 12/28/22  COGNITION: Overall cognitive status: Within functional limits for tasks  assessed     SENSATION: Patient reports no numbness or tingling  EDEMA:  Unable to be assessed due to ace wrap and compression stockings   PALPATION: Mild TTP: right quadriceps, hamstrings, and hip adductors  LOWER EXTREMITY ROM:  Active ROM Right eval Right 12/07/22 Right 12/21/22 Right 12/28/22 Left eval  Hip flexion       Hip extension       Hip abduction       Hip adduction       Hip internal rotation       Hip external rotation       Knee flexion 89/95 (PROM)  80 108 117 130  Knee extension 12 9 6 5  0  Ankle dorsiflexion       Ankle plantarflexion       Ankle inversion       Ankle eversion        (Blank rows = not tested)  LOWER EXTREMITY MMT: not tested due to surgical condition  FUNCTIONAL MOBILITY:  Required modA with sit to supine and supine to sit transfers  GAIT: Assistive device utilized: Environmental consultant - 4 wheeled Level of assistance: Modified independence Comments: step to pattern with decreased stride length and right knee flexed in stance   TODAY'S TREATMENT:                                                                                                                              DATE:                                     12/31/22 EXERCISE LOG  Exercise Repetitions and Resistance Comments  Nustep  L5 x 15 minutes; seat 7   Rocker board  3 minutes    Step up/down  8" step x 20 reps each    Cybex knee flexion  50# x 3 minutes   Cybex knee extension  30# x 2 x 1.5 minutes   Side stepping on foam  2 minutes    Lunges onto step  14" step x 3 minutes   Sit to stand  17 reps  With bias onto RLE   Blank cell = exercise not performed today  12/28/22 EXERCISE LOG  Exercise Repetitions and Resistance Comments  Nustep  L4 x 15 minutes; seat 7-5   Rocker board  3 minutes    Lunges onto step  14" step x 25 reps  RLE on step for knee flexion   Cybex knee flexion  40# x 1.5 minutes progressing to 50# x 1.5 minutes    Cybex knee  extension 30# x 2 x 25 reps     Blank cell = exercise not performed today                                    12/24/22 EXERCISE LOG  Exercise Repetitions and Resistance Comments  Nustep  L4 x 15 minutes; seat 8-6   Rocker board  5 minutes   Lunges onto step  14" step x 4 minutes  RLE on step for knee flexion   Cybex knee flexion  40# x 4 minutes    Cybex knee extension  20# x 2.5 minutes    Tandem stance on foam  3 x 30 seconds each  Intermittent UE support  Sit to stand  20 reps  Standard height chair without UE support    Blank cell = exercise not performed today   PATIENT EDUCATION:  Education details: healing, expectations for soreness, objective measurement, and progress with therapy Person educated: Patient Education method: Explanation Education comprehension: verbalized understanding  HOME EXERCISE PROGRAM:   ASSESSMENT:  CLINICAL IMPRESSION: Patient was progressed with step ups and step downs for improved household mobility. He required minimal cueing with today's interventions for proper exercise performance. Muscular fatigue was his primary limitation with weighted knee extension being the most difficult. He reported that his knee felt sore upon the conclusion of treatment. He continues to require skilled physical therapy to address his remaining impairments to return to his prior level of function.   OBJECTIVE IMPAIRMENTS: Abnormal gait, decreased activity tolerance, decreased mobility, difficulty walking, decreased ROM, decreased strength, hypomobility, increased edema, impaired tone, and pain.   ACTIVITY LIMITATIONS: carrying, lifting, standing, squatting, sleeping, stairs, transfers, bed mobility, locomotion level, and caring for others  PARTICIPATION LIMITATIONS: meal prep, cleaning, laundry, shopping, community activity, occupation, and yard work  PERSONAL FACTORS: Past/current experiences and 3+ comorbidities: Osteoarthritis, allergies, chronic kidney disease,  hypertension, and chronic low back pain  are also affecting patient's functional outcome.   REHAB POTENTIAL: Good  CLINICAL DECISION MAKING: Stable/uncomplicated  EVALUATION COMPLEXITY: Low   GOALS: Goals reviewed with patient? Yes  LONG TERM GOALS: Target date: 12/25/22  Patient will be independent with his HEP. Baseline:  Goal status: MET  2.  Patient will be able to demonstrate at least 120 degrees of right knee flexion for improved function navigating stairs. Baseline: 108 degrees on 12/21/22; 117 degrees on 12/28/22 Goal status: ON GOING  3.  Patient will be able to ambulate with no significant gait deviations. Baseline:  Goal status: ON GOING  4.  Patient will improve his right knee extension within 5 degrees of neutral for improved gait mechanics. Baseline: 6 degrees on 12/21/22; 5 degrees on 12/28/22 Goal status: MET  5.  Patient will be able to navigate at least 4 steps with a reciprocal pattern for improved household mobility. Baseline: reciprocal pattern ascending and descending stairs Goal status: MET  PLAN:  PT FREQUENCY: 2-3x/week  PT DURATION: 4 weeks  PLANNED INTERVENTIONS: 97146- PT Re-evaluation, 97110-Therapeutic exercises, 97530- Therapeutic activity, [id number]- Neuromuscular re-education, 97535-  Self Care, 21308- Manual therapy, [phone]- Gait training, [phone]- Electrical stimulation (unattended), 97016- Vasopneumatic device, Patient/Family education, Balance training, Stair training, Joint mobilization, Cryotherapy, and Moist heat  PLAN FOR NEXT SESSION: NuStep, heel slides, quad sets, manual therapy, and modalities as needed   Granville Lewis, PT 12/31/2022, 2:29 PM

## 2023-01-05 ENCOUNTER — Ambulatory Visit: Payer: BC Managed Care – PPO

## 2023-01-05 DIAGNOSIS — M25561 Pain in right knee: Secondary | ICD-10-CM

## 2023-01-05 DIAGNOSIS — M25661 Stiffness of right knee, not elsewhere classified: Secondary | ICD-10-CM

## 2023-01-05 NOTE — Therapy (Signed)
OUTPATIENT PHYSICAL THERAPY LOWER EXTREMITY TREATMENT   Patient Name: Thomas Pham MRN: [id number] DOB:20-Feb-1955, 67 y.o., male Today's Date: 01/05/2023  END OF SESSION:  PT End of Session - 01/05/23 1309     Visit Number 14    Number of Visits 16    Date for PT Re-Evaluation 01/15/23    PT Start Time 1300    PT Stop Time 1340    PT Time Calculation (min) 40 min    Activity Tolerance Patient tolerated treatment well    Behavior During Therapy [facility] for tasks assessed/performed                          Past Medical History:  Diagnosis Date   Allergy    Arthritis    osteo   Basal cell carcinoma 09/13/2014   BCC NODULAR LEFT CHEEK PER ST CLE[facility]   Basal cell carcinoma (BCC) 09/30/2020   SUP & NOD - R UPPER ABDOMEN (CX3FU)   Basal cell carcinoma of skin 09/14/2019   right malar cheek -MOHS   BCC (basal cell carcinoma of skin) 09/09/2017   BCC MICRONODULAR LEFT CHEEK CX3 AND EXC   BCC (basal cell carcinoma of skin) 09/09/2017   BCC POSITIVE MARGIN MOHS DR PEARCE   Chronic kidney disease    Difficulty sleeping    GERD (gastroesophageal reflux disease)    takes nexium for acid reflux as needed   History of gout    History of hiatal hernia    umbilical hernia   History of kidney stones    Hyperlipidemia    Hypertension    Hypothyroidism    Low back pain    Medial meniscus tear    LEFT   Organic impotence    Penile cyst    Sleep apnea    not using mask d/t preference   Thyroid disease    Past Surgical History:  Procedure Laterality Date   KNEE ARTHROSCOPY Left 03/07/2014   Procedure: LEFT ARTHROSCOPY KNEE WITH MEDIAL MENISCECTOMY  DEBRIDEMENT AND CHONDROPLASTY ;  Surgeon: Loanne Drilling, MD;  Location: [facility];  Service: Orthopedics;  Laterality: Left;   THYROIDECTOMY Right 2013   TONSILLECTOMY     childhood   TOTAL KNEE ARTHROPLASTY Left 03/04/2016   Procedure: LEFT TOTAL KNEE ARTHROPLASTY;  Surgeon: Ollen Gross, MD;  Location: [facility];   Service: Orthopedics;  Laterality: Left;   Patient Active Problem List   Diagnosis Date Noted   Low testosterone 06/26/2021   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   BPH without obstruction/lower urinary tract symptoms 06/26/2021   Severe obstructive sleep apnea-hypopnea syndrome 10/12/2018   OA (osteoarthritis) of knee 03/04/2016   Acute medial meniscal tear 03/06/2014   Cold hands and feet 08/23/2012   REFERRING PROVIDER: Ollen Gross, MD   REFERRING DIAG: Unilateral primary osteoarthritis, right knee   THERAPY DIAG:  Stiffness of right knee, not elsewhere classified  Acute pain of right knee  Rationale for Evaluation and Treatment: Rehabilitation  ONSET DATE: 11/25/22  SUBJECTIVE:   SUBJECTIVE STATEMENT: Patient reports that his knee is a little uncomfortable, but not bad.   PERTINENT HISTORY: Osteoarthritis, allergies, chronic kidney disease, hypertension, and chronic low back pain PAIN:  Are you having pain? Yes: NPRS scale: no pain score provided/10 Pain location: right knee Pain description: soreness Aggravating factors: using his knee immobilizers Relieving factors: walking, ice, and medication  PRECAUTIONS: None  RED FLAGS: None   WEIGHT BEARING RESTRICTIONS: No  FALLS:  Has  patient fallen in last 6 months? No  LIVING ENVIRONMENT: Lives with: lives with their family Lives in: House/apartment Stairs: Yes: Internal: 2-12 steps; on right going up; "going up with the good and down with the bad"  Has following equipment at home: Dan Humphreys - 4 wheeled  OCCUPATION: TV producer  PLOF: Independent  PATIENT GOALS: be able to play basketball, be able to meet his exercise goals, be able to do yard work, and improved mobility  NEXT MD VISIT: 12/29/22  OBJECTIVE:  Note: Objective measures were completed at Evaluation unless otherwise noted.  PATIENT SURVEYS:  FOTO 69.15 on 12/28/22  COGNITION: Overall cognitive status: Within functional limits for tasks  assessed     SENSATION: Patient reports no numbness or tingling  EDEMA:  Unable to be assessed due to ace wrap and compression stockings   PALPATION: Mild TTP: right quadriceps, hamstrings, and hip adductors  LOWER EXTREMITY ROM:  Active ROM Right eval Right 12/07/22 Right 12/21/22 Right 12/28/22 Left eval  Hip flexion       Hip extension       Hip abduction       Hip adduction       Hip internal rotation       Hip external rotation       Knee flexion 89/95 (PROM)  80 108 117 130  Knee extension 12 9 6 5  0  Ankle dorsiflexion       Ankle plantarflexion       Ankle inversion       Ankle eversion        (Blank rows = not tested)  LOWER EXTREMITY MMT: not tested due to surgical condition  FUNCTIONAL MOBILITY:  Required modA with sit to supine and supine to sit transfers  GAIT: Assistive device utilized: Environmental consultant - 4 wheeled Level of assistance: Modified independence Comments: step to pattern with decreased stride length and right knee flexed in stance   TODAY'S TREATMENT:                                                                                                                              DATE:                                     01/05/23 EXERCISE LOG  Exercise Repetitions and Resistance Comments  Recumbent bike  L4 x 15 minutes; seat 10-9   Marching on BOSU Ball up x 2 minutes  BUE support   Lateral step up  8" step x 20 reps each    Cybex knee extension  30# x 2 minutes    Cybex knee flexion  50# x 2 minutes   Rocker board 2 minutes    SLR  20 reps     Blank cell = exercise not performed today  12/31/22 EXERCISE LOG  Exercise Repetitions and Resistance Comments  Nustep  L5 x 15 minutes; seat 7   Rocker board  3 minutes    Step up/down  8" step x 20 reps each    Cybex knee flexion  50# x 3 minutes   Cybex knee extension  30# x 2 x 1.5 minutes   Side stepping on foam  2 minutes    Lunges onto step  14" step x 3 minutes    Sit to stand  17 reps  With bias onto RLE   Blank cell = exercise not performed today                                    12/28/22 EXERCISE LOG  Exercise Repetitions and Resistance Comments  Nustep  L4 x 15 minutes; seat 7-5   Rocker board  3 minutes    Lunges onto step  14" step x 25 reps  RLE on step for knee flexion   Cybex knee flexion  40# x 1.5 minutes progressing to 50# x 1.5 minutes    Cybex knee extension 30# x 2 x 25 reps     Blank cell = exercise not performed today   PATIENT EDUCATION:  Education details: healing, expectations for soreness, objective measurement, and progress with therapy Person educated: Patient Education method: Explanation Education comprehension: verbalized understanding  HOME EXERCISE PROGRAM:   ASSESSMENT:  CLINICAL IMPRESSION: Patient was progressed with familiar interventions for improved lower extremity strength and stability for improved function with his daily activities. He required minimal cueing with marching on the BOSU for improved foot clearance to facilitate improved stability in single leg stance. He was able to demonstrate improved right knee flexion as he was able to meet his long term knee flexion goal for physical therapy. He will be discharged with an updated HEP at his next appointment.    OBJECTIVE IMPAIRMENTS: Abnormal gait, decreased activity tolerance, decreased mobility, difficulty walking, decreased ROM, decreased strength, hypomobility, increased edema, impaired tone, and pain.   ACTIVITY LIMITATIONS: carrying, lifting, standing, squatting, sleeping, stairs, transfers, bed mobility, locomotion level, and caring for others  PARTICIPATION LIMITATIONS: meal prep, cleaning, laundry, shopping, community activity, occupation, and yard work  PERSONAL FACTORS: Past/current experiences and 3+ comorbidities: Osteoarthritis, allergies, chronic kidney disease, hypertension, and chronic low back pain  are also affecting patient's  functional outcome.   REHAB POTENTIAL: Good  CLINICAL DECISION MAKING: Stable/uncomplicated  EVALUATION COMPLEXITY: Low   GOALS: Goals reviewed with patient? Yes  LONG TERM GOALS: Target date: 12/25/22  Patient will be independent with his HEP. Baseline:  Goal status: MET  2.  Patient will be able to demonstrate at least 120 degrees of right knee flexion for improved function navigating stairs. Baseline: 108 degrees on 12/21/22; 117 degrees on 12/28/22 120 degrees on 01/05/23 Goal status: MET  3.  Patient will be able to ambulate with no significant gait deviations. Baseline:  Goal status: ON GOING  4.  Patient will improve his right knee extension within 5 degrees of neutral for improved gait mechanics. Baseline: 6 degrees on 12/21/22; 5 degrees on 12/28/22 Goal status: MET  5.  Patient will be able to navigate at least 4 steps with a reciprocal pattern for improved household mobility. Baseline: reciprocal pattern ascending and descending stairs Goal status: MET  PLAN:  PT FREQUENCY: 2-3x/week  PT DURATION: 4 weeks  PLANNED INTERVENTIONS: 97146- PT Re-evaluation,  97110-Therapeutic exercises, 97530- Therapeutic activity, [id number]- Neuromuscular re-education, 97535- Self Care, 40981- Manual therapy, [phone]- Gait training, 97014- Electrical stimulation (unattended), 97016- Vasopneumatic device, Patient/Family education, Balance training, Stair training, Joint mobilization, Cryotherapy, and Moist heat  PLAN FOR NEXT SESSION: NuStep, heel slides, quad sets, manual therapy, and modalities as needed   Granville Lewis, PT 01/05/2023, 1:59 PM

## 2023-01-12 ENCOUNTER — Ambulatory Visit: Payer: BC Managed Care – PPO

## 2023-01-12 DIAGNOSIS — M25661 Stiffness of right knee, not elsewhere classified: Secondary | ICD-10-CM | POA: Diagnosis not present

## 2023-01-12 DIAGNOSIS — M25561 Pain in right knee: Secondary | ICD-10-CM

## 2023-01-12 NOTE — Therapy (Signed)
OUTPATIENT PHYSICAL THERAPY LOWER EXTREMITY TREATMENT   Patient Name: Thomas Pham MRN: [id number] DOB:1955/10/09, 67 y.o., male Today's Date: 01/12/2023  END OF SESSION:  PT End of Session - 01/12/23 1105     Visit Number 15    Number of Visits 16    Date for PT Re-Evaluation 01/15/23    PT Start Time 1100    PT Stop Time 1135    PT Time Calculation (min) 35 min    Activity Tolerance Patient tolerated treatment well    Behavior During Therapy [facility] for tasks assessed/performed                           Past Medical History:  Diagnosis Date   Allergy    Arthritis    osteo   Basal cell carcinoma 09/13/2014   BCC NODULAR LEFT CHEEK PER ST CLE[facility]   Basal cell carcinoma (BCC) 09/30/2020   SUP & NOD - R UPPER ABDOMEN (CX3FU)   Basal cell carcinoma of skin 09/14/2019   right malar cheek -MOHS   BCC (basal cell carcinoma of skin) 09/09/2017   BCC MICRONODULAR LEFT CHEEK CX3 AND EXC   BCC (basal cell carcinoma of skin) 09/09/2017   BCC POSITIVE MARGIN MOHS DR PEARCE   Chronic kidney disease    Difficulty sleeping    GERD (gastroesophageal reflux disease)    takes nexium for acid reflux as needed   History of gout    History of hiatal hernia    umbilical hernia   History of kidney stones    Hyperlipidemia    Hypertension    Hypothyroidism    Low back pain    Medial meniscus tear    LEFT   Organic impotence    Penile cyst    Sleep apnea    not using mask d/t preference   Thyroid disease    Past Surgical History:  Procedure Laterality Date   KNEE ARTHROSCOPY Left 03/07/2014   Procedure: LEFT ARTHROSCOPY KNEE WITH MEDIAL MENISCECTOMY  DEBRIDEMENT AND CHONDROPLASTY ;  Surgeon: Loanne Drilling, MD;  Location: [facility];  Service: Orthopedics;  Laterality: Left;   THYROIDECTOMY Right 2013   TONSILLECTOMY     childhood   TOTAL KNEE ARTHROPLASTY Left 03/04/2016   Procedure: LEFT TOTAL KNEE ARTHROPLASTY;  Surgeon: Ollen Gross, MD;  Location: [facility];   Service: Orthopedics;  Laterality: Left;   Patient Active Problem List   Diagnosis Date Noted   Low testosterone 06/26/2021   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   BPH without obstruction/lower urinary tract symptoms 06/26/2021   Severe obstructive sleep apnea-hypopnea syndrome 10/12/2018   OA (osteoarthritis) of knee 03/04/2016   Acute medial meniscal tear 03/06/2014   Cold hands and feet 08/23/2012   REFERRING PROVIDER: Ollen Gross, MD   REFERRING DIAG: Unilateral primary osteoarthritis, right knee   THERAPY DIAG:  Stiffness of right knee, not elsewhere classified  Acute pain of right knee  Rationale for Evaluation and Treatment: Rehabilitation  ONSET DATE: 11/25/22  SUBJECTIVE:   SUBJECTIVE STATEMENT: Patient reports that he feels good today and he feels like he is ready to be discharged.   PERTINENT HISTORY: Osteoarthritis, allergies, chronic kidney disease, hypertension, and chronic low back pain PAIN:  Are you having pain? Yes: NPRS scale: 0/10 Pain location: right knee Pain description: soreness Aggravating factors: using his knee immobilizers Relieving factors: walking, ice, and medication  PRECAUTIONS: None  RED FLAGS: None   WEIGHT BEARING RESTRICTIONS: No  FALLS:  Has patient fallen in last 6 months? No  LIVING ENVIRONMENT: Lives with: lives with their family Lives in: House/apartment Stairs: Yes: Internal: 2-12 steps; on right going up; "going up with the good and down with the bad"  Has following equipment at home: Dan Humphreys - 4 wheeled  OCCUPATION: TV producer  PLOF: Independent  PATIENT GOALS: be able to play basketball, be able to meet his exercise goals, be able to do yard work, and improved mobility  NEXT MD VISIT: 12/29/22  OBJECTIVE:  Note: Objective measures were completed at Evaluation unless otherwise noted.  PATIENT SURVEYS:  FOTO 69.15 on 12/28/22  COGNITION: Overall cognitive status: Within functional limits for tasks  assessed     SENSATION: Patient reports no numbness or tingling  EDEMA:  Unable to be assessed due to ace wrap and compression stockings   PALPATION: Mild TTP: right quadriceps, hamstrings, and hip adductors  LOWER EXTREMITY ROM:  Active ROM Right eval Right 12/07/22 Right 12/21/22 Right 12/28/22 Right 01/12/23 Left eval  Hip flexion        Hip extension        Hip abduction        Hip adduction        Hip internal rotation        Hip external rotation        Knee flexion 89/95 (PROM)  80 108 117 123 130  Knee extension 12 9 6 5 3  0  Ankle dorsiflexion        Ankle plantarflexion        Ankle inversion        Ankle eversion         (Blank rows = not tested)  LOWER EXTREMITY MMT: not tested due to surgical condition  FUNCTIONAL MOBILITY:  Required modA with sit to supine and supine to sit transfers  GAIT: Assistive device utilized: Environmental consultant - 4 wheeled Level of assistance: Modified independence Comments: step to pattern with decreased stride length and right knee flexed in stance   TODAY'S TREATMENT:                                                                                                                              DATE:                                     01/12/23 EXERCISE LOG  Exercise Repetitions and Resistance Comments  Recumbent bike L5 x 16 minutes; seat 6   Rocker board  20 reps    Lunges onto step  14" step x 15 reps    Cybex knee extension  30# x 2 x 25 reps    Cybex knee flexion  50# x 2 x 25 reps     Blank cell = exercise not performed today  01/05/23 EXERCISE LOG  Exercise Repetitions and Resistance Comments  Recumbent bike  L4 x 15 minutes; seat 10-9   Marching on BOSU Ball up x 2 minutes  BUE support   Lateral step up  8" step x 20 reps each    Cybex knee extension  30# x 2 minutes    Cybex knee flexion  50# x 2 minutes   Rocker board 2 minutes    SLR  20 reps     Blank cell = exercise not performed  today                                    12/31/22 EXERCISE LOG  Exercise Repetitions and Resistance Comments  Nustep  L5 x 15 minutes; seat 7   Rocker board  3 minutes    Step up/down  8" step x 20 reps each    Cybex knee flexion  50# x 3 minutes   Cybex knee extension  30# x 2 x 1.5 minutes   Side stepping on foam  2 minutes    Lunges onto step  14" step x 3 minutes   Sit to stand  17 reps  With bias onto RLE   Blank cell = exercise not performed today   PATIENT EDUCATION:  Education details: healing, expectations for soreness, objective measurement, and progress with therapy Person educated: Patient Education method: Explanation Education comprehension: verbalized understanding  HOME EXERCISE PROGRAM:   ASSESSMENT:  CLINICAL IMPRESSION: Patient has made excellent progress with skilled physical therapy as he has met all of his goals for skilled physical therapy and he felt comfortable being discharged. His HEP was reviewed and he felt comfortable with these interventions. He was educated on his progress with therapy and healing following his total knee arthroplasty. He reported feeling comfortable being discharged at this time.   PHYSICAL THERAPY DISCHARGE SUMMARY  Visits from Start of Care: 15  Current functional level related to goals / functional outcomes: Patient has met all of his goals for skilled physical therapy.    Remaining deficits: None    Education / Equipment: HEP    Patient agrees to discharge. Patient goals were met. Patient is being discharged due to meeting the stated rehab goals.   OBJECTIVE IMPAIRMENTS: Abnormal gait, decreased activity tolerance, decreased mobility, difficulty walking, decreased ROM, decreased strength, hypomobility, increased edema, impaired tone, and pain.   ACTIVITY LIMITATIONS: carrying, lifting, standing, squatting, sleeping, stairs, transfers, bed mobility, locomotion level, and caring for others  PARTICIPATION LIMITATIONS:  meal prep, cleaning, laundry, shopping, community activity, occupation, and yard work  PERSONAL FACTORS: Past/current experiences and 3+ comorbidities: Osteoarthritis, allergies, chronic kidney disease, hypertension, and chronic low back pain  are also affecting patient's functional outcome.   REHAB POTENTIAL: Good  CLINICAL DECISION MAKING: Stable/uncomplicated  EVALUATION COMPLEXITY: Low   GOALS: Goals reviewed with patient? Yes  LONG TERM GOALS: Target date: 12/25/22  Patient will be independent with his HEP. Baseline:  Goal status: MET  2.  Patient will be able to demonstrate at least 120 degrees of right knee flexion for improved function navigating stairs. Baseline: 108 degrees on 12/21/22; 117 degrees on 12/28/22 120 degrees on 01/05/23 Goal status: MET  3.  Patient will be able to ambulate with no significant gait deviations. Baseline:  Goal status: MET  4.  Patient will improve his right knee extension within 5 degrees of neutral for improved gait mechanics. Baseline:  6 degrees on 12/21/22; 5 degrees on 12/28/22 Goal status: MET  5.  Patient will be able to navigate at least 4 steps with a reciprocal pattern for improved household mobility. Baseline: reciprocal pattern ascending and descending stairs Goal status: MET  PLAN:  PT FREQUENCY: 2-3x/week  PT DURATION: 4 weeks  PLANNED INTERVENTIONS: 97146- PT Re-evaluation, 97110-Therapeutic exercises, 97530- Therapeutic activity, 97112- Neuromuscular re-education, 97535- Self Care, 21308- Manual therapy, 97116- Gait training, 97014- Electrical stimulation (unattended), 97016- Vasopneumatic device, Patient/Family education, Balance training, Stair training, Joint mobilization, Cryotherapy, and Moist heat  PLAN FOR NEXT SESSION: NuStep, heel slides, quad sets, manual therapy, and modalities as needed   Granville Lewis, PT 01/12/2023, 12:10 PM

## 2023-07-13 ENCOUNTER — Ambulatory Visit: Admitting: Podiatry

## 2023-09-07 NOTE — Telephone Encounter (Signed)
 Division of Pharmacy Services   Medication History Completion Note   Name/DOB/Age of Patient: Thomas Pham / 03/24/1955 / 68 y.o.   Location: [facility]   Type: Presurgical & Modality: In-Person   Confirmed two patient identifiers: Yes   Confirmed patient is alert & oriented: Yes   Medication History Source (Med History Informants):  Patient Other Dr.First Patient med list     Asked about any missing medications (such as pumps, injectable meds, TPN, OTC, etc): Yes   PTA Med List:  Prior to Admission Medications       Reviewed by Kelly Dorothyann Dolly, CPhT on 09/07/23 at 1207     Medication Sig Last Dose Informant Taking? Status  alpha lipoic acid 600 mg cap Take 600 mg by mouth 3 (three) times a day. 09/07/2023 Self, Other Yes Active  ammonium lactate (LAC-HYDRIN) 12 % lotion Apply topically as needed for dry skin. 09/07/2023 Self, Other Yes Active  aspirin  81 mg EC tablet Take 81 mg by mouth daily. 09/07/2023 Self, Other Yes Active  blood-glucose meter misc Check blood sugar once daily for Type II DM 09/07/2023 Self, Other Yes Active  calcium carbonate (OS-CAL) 1500 mg (600 mg calcium) tablet Take 1 tablet by mouth 2 (two) times a day. 09/07/2023 Self, Other Yes Active  celecoxib (CeleBREX) 200 mg capsule Take 200 mg by mouth 3 (three) times a week. As needed for pain. 09/07/2023 Self, Other Yes Active               Med Note (BENNETT, ELISE C   Tue Sep 07, 2023 11:52 AM)    cetirizine (ZyrTEC) 1 mg/mL syrup Take 10 mL (10 mg total) by mouth 2 (two) times a day. 09/07/2023 Self, Other Yes Active  cetirizine (ZyrTEC) 10 mg tablet Take 10 mg by mouth 2 (two) times a day. 09/07/2023 Self, Other Yes Active  cholecalciferol (VITAMIN D3) 1,000 unit (25 mcg) tablet Take 2,000 Units by mouth daily. 09/07/2023 Self, Other Yes Active  clobetasoL (TEMOVATE) 0.05 % ointment Apply to the sores on the tongue twice daily only as needed. Avoid using on the face, normal skin, or uninvolved areas  of the tongue. 09/07/2023 Self, Other Yes Active  clotrimazole-betamethasone (LOTRISONE) 1-0.05 % cream Apply 1 Application topically 2 (two) times a day. 09/07/2023 Self, Other Yes Active  cyanocobalamin (VITAMIN B12) 1,000 mcg tablet Take 1,000 mcg by mouth daily. 09/07/2023 Self, Other Yes Active  dupilumab  (DUPIXENT ) 300 mg/2 mL pnij Inject 2 mLs (300 mg total) into the skin every 14 days. Past Month Self, Other Yes Active               Med Note (BENNETT, ELISE C   Tue Sep 07, 2023 12:01 PM) Next injection due 7/25  empagliflozin (Jardiance) 25 mg tab Take 1 tablet (25 mg total) by mouth daily. 09/07/2023 Self, Other Yes Active  esomeprazole (NexIUM) 40 mg DR capsule Take one capsule (40 mg total) by mouth in the morning and one capsule (40 mg total) in the evening. Take with meals. 09/07/2023 Self, Other Yes Active  famotidine  (PEPCID ) 20 mg tablet Take 1 tablet (20 mg total) by mouth 2 (two) times a day.  Patient taking differently: Take 20 mg by mouth daily.   09/07/2023 Self, Other Yes Active               Med Note MANUAL, NAKIA   Mon Jun 14, 2023 12:25 PM)    febuxostat  (ULORIC ) 40 mg tablet Take  one tablet (40 mg total) by mouth daily. 09/07/2023 Self, Other Yes Active  fexofenadine (ALLEGRA) 180 mg tablet Take 180 mg by mouth daily. 09/07/2023 Self, Other Yes Active  flunisolide (NASALIDE) 25 mcg (0.025 %) spry nasal spray Administer two sprays into each nostril 2 (two) times a day. 09/07/2023 Self, Other Yes Active  furosemide (LASIX) 20 mg tablet TAKE ONE TABLET DAILY  Patient taking differently: Take 20 mg by mouth daily as needed (swelling).   09/07/2023 Self, Other Yes Active  gabapentin  (NEURONTIN ) 800 mg tablet Take 1 tablet (800 mg total) by mouth in the morning and 1 tablet (800 mg total) at noon and 1 tablet (800 mg total) in the evening.  Patient taking differently: Take 800 mg by mouth 2 (two) times a day.   09/07/2023 Self, Other Yes Active               Med Note (BENNETT, ELISE  C   Tue Sep 07, 2023 12:00 PM) Medication last fill date 07/16/2023 for #270/90 from [facility]      glimepiride (AMARYL) 4 mg tablet Take two tablets (8 mg total) by mouth daily with breakfast. 09/07/2023 Self, Other Yes Active  glucosamine-chondroit-vit C-Mn 500-400 mg cap capsule Take by mouth 2 (two) times a day.   Self, Other   Active  glucose blood (Precision Q-I-D Test) test strip Check blood sugar once daily for Type II DM 09/07/2023 Self, Other Yes Active  HYDROcodone -acetaminophen  (NORCO) 5-325 mg per tablet Take 1 tablet by mouth every 6 (six) hours as needed (pain). 09/07/2023 Self, Other Yes Active               Med Note (BENNETT, ELISE C   Tue Sep 07, 2023 11:51 AM) Medication last fill date 08/26/2023 for #100/25 from [facility]    ketoconazole (NIZORAL) 2 % shampoo   09/07/2023 Self, Other Yes Active  Lancets misc Check blood sugar once daily for Type II DM 09/07/2023 Self, Other Yes Active  levothyroxine  (SYNTHROID ) 75 mcg tablet Take 1 tablet (75 mcg total) by mouth daily. 09/07/2023 Self, Other Yes Active               Med Note MANUAL, NAKIA   Mon Jun 14, 2023 12:25 PM)    magnesium chloride (SLOW-MAG) 71.5 mg TbEC tablet Take 2 tablets by mouth daily. 09/07/2023 Self, Other Yes Active  melatonin 5 mg tablet Take 5 mg by mouth at bedtime. 09/06/2023 Bedtime Self, Other Yes Active  olmesartan -hydroCHLOROthiazide  20-12.5 mg tab tablet Take 0.5 tablets by mouth daily. Prescribed by nephrology. 09/07/2023 Self, Other Yes Active               Med Note (BENNETT, ELISE C   Tue Sep 07, 2023 12:04 PM) Pt advised to take 1 full tablet while Mounjaro is on hold, then go back to 0.5 tablet once restarted after procedure  potassium chloride (KLOR-CON) 10 mEq ER tablet Take 1 tablet (10 mEq total) by mouth daily on days furosemide is taken. 09/07/2023 Self, Other Yes Active  Relistor  150 mg tab tablet Take 150 mg by mouth daily. 09/07/2023 Self, Other Yes Active   simvastatin  (ZOCOR ) 40 mg tablet Take one tablet (40 mg total) by mouth nightly. 09/06/2023 Bedtime Self, Other Yes Active  tadalafiL (CIALIS) 5 mg tablet TAKE 1 TO 4 TABLETS DAILY AS NEEDED  Patient taking differently: Take 5-20 mg by mouth daily as needed for erectile dysfunction.   09/07/2023  Self, Other Yes Active  tirzepatide (Mounjaro) 5 mg/0.5 mL subcutaneous pen injector Inject 5 mg under the skin every 7 days. 09/03/2023 Self, Other Yes Active               Med Note (BENNETT, ELISE C   Tue Sep 07, 2023 12:01 PM) On hold before procedure. Last dose taken on 7/18  tretinoin (Retin-A) 0.05 % cream   09/07/2023 Self, Other Yes Active                  Selected Pharmacy:  [id number] - [place], [place] - [address] - PHONE: [phone] - FAX: [phone]   Comments:  All medications and allergies were verified in the Pre-Op Assessment clinic by the patient who is a good historian, and last fills/doses were verified by Dr. Annemarie.   Medications removed: n/a   Medications flagged for removal: n/a   Reviewed by Certified Medication List Pharmacy Technician Electronically signed by: Kelly Dorothyann Dolly, CPhT 09/07/2023 12:07 PM   Anticipated Procedure Date: 09/27/2023   DPS DECLINED         Medications reconciled by provider: No     Signature/Co-signature, if required: Kelly Dorothyann Dolly, CPhT    Date/Time: 09/07/2023 12:08 PM

## 2023-09-11 ENCOUNTER — Observation Stay (HOSPITAL_BASED_OUTPATIENT_CLINIC_OR_DEPARTMENT_OTHER)
Admission: EM | Admit: 2023-09-11 | Discharge: 2023-09-12 | Disposition: A | Discharge status: Home / Self Care | Payer: No Typology Code available for payment source | Attending: Family Medicine | Admitting: Family Medicine

## 2023-09-11 ENCOUNTER — Other Ambulatory Visit: Payer: Self-pay

## 2023-09-11 ENCOUNTER — Encounter (HOSPITAL_BASED_OUTPATIENT_CLINIC_OR_DEPARTMENT_OTHER): Payer: Self-pay

## 2023-09-11 ENCOUNTER — Inpatient Hospital Stay (HOSPITAL_COMMUNITY): Payer: No Typology Code available for payment source

## 2023-09-11 ENCOUNTER — Emergency Department (HOSPITAL_BASED_OUTPATIENT_CLINIC_OR_DEPARTMENT_OTHER): Payer: No Typology Code available for payment source

## 2023-09-11 DIAGNOSIS — L281 Prurigo nodularis: Secondary | ICD-10-CM | POA: Diagnosis not present

## 2023-09-11 DIAGNOSIS — R41841 Cognitive communication deficit: Secondary | ICD-10-CM | POA: Insufficient documentation

## 2023-09-11 DIAGNOSIS — R262 Difficulty in walking, not elsewhere classified: Secondary | ICD-10-CM | POA: Diagnosis not present

## 2023-09-11 DIAGNOSIS — R4182 Altered mental status, unspecified: Secondary | ICD-10-CM | POA: Diagnosis present

## 2023-09-11 DIAGNOSIS — G629 Polyneuropathy, unspecified: Secondary | ICD-10-CM | POA: Insufficient documentation

## 2023-09-11 DIAGNOSIS — F1090 Alcohol use, unspecified, uncomplicated: Secondary | ICD-10-CM | POA: Diagnosis not present

## 2023-09-11 DIAGNOSIS — M109 Gout, unspecified: Secondary | ICD-10-CM | POA: Insufficient documentation

## 2023-09-11 DIAGNOSIS — Z7982 Long term (current) use of aspirin: Secondary | ICD-10-CM | POA: Insufficient documentation

## 2023-09-11 DIAGNOSIS — E039 Hypothyroidism, unspecified: Secondary | ICD-10-CM | POA: Diagnosis not present

## 2023-09-11 DIAGNOSIS — E11649 Type 2 diabetes mellitus with hypoglycemia without coma: Secondary | ICD-10-CM | POA: Diagnosis not present

## 2023-09-11 DIAGNOSIS — D696 Thrombocytopenia, unspecified: Secondary | ICD-10-CM | POA: Diagnosis not present

## 2023-09-11 DIAGNOSIS — I10 Essential (primary) hypertension: Secondary | ICD-10-CM | POA: Diagnosis not present

## 2023-09-11 DIAGNOSIS — R410 Disorientation, unspecified: Secondary | ICD-10-CM | POA: Diagnosis not present

## 2023-09-11 DIAGNOSIS — N179 Acute kidney failure, unspecified: Principal | ICD-10-CM | POA: Insufficient documentation

## 2023-09-11 DIAGNOSIS — G4733 Obstructive sleep apnea (adult) (pediatric): Secondary | ICD-10-CM | POA: Diagnosis not present

## 2023-09-11 DIAGNOSIS — E162 Hypoglycemia, unspecified: Secondary | ICD-10-CM

## 2023-09-11 LAB — COMPREHENSIVE METABOLIC PANEL WITH GFR
ALT: 16 U/L (ref 0–44)
AST: 27 U/L (ref 15–41)
Albumin: 4.2 g/dL (ref 3.5–5.0)
Alkaline Phosphatase: 81 U/L (ref 38–126)
Anion gap: 13 (ref 5–15)
BUN: 35 mg/dL — ABNORMAL HIGH (ref 8–23)
CO2: 25 mmol/L (ref 22–32)
Calcium: 9.6 mg/dL (ref 8.9–10.3)
Chloride: 102 mmol/L (ref 98–111)
Creatinine, Ser: 2.3 mg/dL — ABNORMAL HIGH (ref 0.61–1.24)
GFR, Estimated: 31 mL/min — ABNORMAL LOW (ref >60)
Glucose, Bld: 61 mg/dL — ABNORMAL LOW (ref 70–99)
Potassium: 3.7 mmol/L (ref 3.5–5.1)
Sodium: 139 mmol/L (ref 135–145)
Total Bilirubin: 0.6 mg/dL (ref 0.0–1.2)
Total Protein: 6.9 g/dL (ref 6.5–8.1)

## 2023-09-11 LAB — CBC WITH DIFFERENTIAL/PLATELET
Abs Immature Granulocytes: 0.06 K/uL (ref 0.00–0.07)
Basophils Absolute: 0 K/uL (ref 0.0–0.1)
Basophils Relative: 0 %
Eosinophils Absolute: 0.1 K/uL (ref 0.0–0.5)
Eosinophils Relative: 1 %
HCT: 42.7 % (ref 39.0–52.0)
Hemoglobin: 13.7 g/dL (ref 13.0–17.0)
Immature Granulocytes: 1 %
Lymphocytes Relative: 12 %
Lymphs Abs: 1 K/uL (ref 0.7–4.0)
MCH: 26.4 pg (ref 26.0–34.0)
MCHC: 32.1 g/dL (ref 30.0–36.0)
MCV: 82.3 fL (ref 80.0–100.0)
Monocytes Absolute: 0.6 K/uL (ref 0.1–1.0)
Monocytes Relative: 6 %
Neutro Abs: 6.9 K/uL (ref 1.7–7.7)
Neutrophils Relative %: 80 %
Platelets: 119 K/uL — ABNORMAL LOW (ref 150–400)
RBC: 5.19 MIL/uL (ref 4.22–5.81)
RDW: 22.9 % — ABNORMAL HIGH (ref 11.5–15.5)
WBC: 8.6 K/uL (ref 4.0–10.5)
nRBC: 0 % (ref 0.0–0.2)

## 2023-09-11 LAB — ETHANOL: Alcohol, Ethyl (B): <15 mg/dL (ref <15)

## 2023-09-11 LAB — RAPID URINE DRUG SCREEN, HOSP PERFORMED
Amphetamines: NOT DETECTED
Barbiturates: NOT DETECTED
Benzodiazepines: NOT DETECTED
Cocaine: NOT DETECTED
Opiates: NOT DETECTED
Tetrahydrocannabinol: NOT DETECTED

## 2023-09-11 LAB — TROPONIN I (HIGH SENSITIVITY)
Troponin I (High Sensitivity): 5 ng/L (ref <18)
Troponin I (High Sensitivity): 5 ng/L (ref <18)

## 2023-09-11 LAB — URINALYSIS, ROUTINE W REFLEX MICROSCOPIC
Bilirubin Urine: NEGATIVE
Glucose, UA: 250 mg/dL — AB
Hgb urine dipstick: NEGATIVE
Ketones, ur: NEGATIVE mg/dL
Leukocytes,Ua: NEGATIVE
Nitrite: NEGATIVE
Protein, ur: NEGATIVE mg/dL
Specific Gravity, Urine: 1.005 (ref 1.005–1.030)
pH: 5.5 (ref 5.0–8.0)

## 2023-09-11 LAB — GLUCOSE, CAPILLARY
Glucose-Capillary: 49 mg/dL — ABNORMAL LOW (ref 70–99)
Glucose-Capillary: 156 mg/dL — ABNORMAL HIGH (ref 70–99)
Glucose-Capillary: 58 mg/dL — ABNORMAL LOW (ref 70–99)
Glucose-Capillary: 84 mg/dL (ref 70–99)
Glucose-Capillary: 110 mg/dL — ABNORMAL HIGH (ref 70–99)

## 2023-09-11 LAB — HIV ANTIBODY (ROUTINE TESTING W REFLEX): HIV Screen 4th Generation wRfx: NONREACTIVE

## 2023-09-11 LAB — LIPASE, BLOOD: Lipase: 32 U/L (ref 11–51)

## 2023-09-11 LAB — HEMOGLOBIN A1C
Hgb A1c MFr Bld: 5.8 % — ABNORMAL HIGH (ref 4.8–5.6)
Mean Plasma Glucose: 119.76 mg/dL

## 2023-09-11 LAB — CBG MONITORING, ED
Glucose-Capillary: 78 mg/dL (ref 70–99)
Glucose-Capillary: 83 mg/dL (ref 70–99)

## 2023-09-11 LAB — TROPONIN T, HIGH SENSITIVITY: Troponin T High Sensitivity: 34 ng/L — ABNORMAL HIGH (ref <19)

## 2023-09-11 LAB — MAGNESIUM: Magnesium: 2.2 mg/dL (ref 1.7–2.4)

## 2023-09-11 LAB — CK: Total CK: 241 U/L (ref 49–397)

## 2023-09-11 LAB — C-REACTIVE PROTEIN: CRP: 1.1 mg/dL — ABNORMAL HIGH (ref <1.0)

## 2023-09-11 MED ORDER — HYDRALAZINE HCL 20 MG/ML IJ SOLN: 5.0000 mg | INTRAMUSCULAR | Status: DC | PRN

## 2023-09-11 MED ORDER — ACETAMINOPHEN 650 MG RE SUPP: 650.0000 mg | RECTAL | Status: DC | PRN

## 2023-09-11 MED ORDER — ACETAMINOPHEN 160 MG/5ML PO SOLN: 650.0000 mg | ORAL | Status: DC | PRN

## 2023-09-11 MED ORDER — DEXTROSE 50 % IV SOLN
25.0000 mL | Freq: Once | INTRAVENOUS | Status: AC
  Administered 2023-09-11: 25 mL via INTRAVENOUS
  Filled 2023-09-11: qty 50

## 2023-09-11 MED ORDER — STROKE: EARLY STAGES OF RECOVERY BOOK
Freq: Once | Status: AC
  Filled 2023-09-11: qty 1

## 2023-09-11 MED ORDER — THIAMINE HCL 100 MG/ML IJ SOLN
100.0000 mg | INTRAMUSCULAR | Status: DC
  Administered 2023-09-11: 100 mg via INTRAVENOUS
  Filled 2023-09-11: qty 2

## 2023-09-11 MED ORDER — LACTATED RINGERS IV BOLUS
1000.0000 mL | Freq: Once | INTRAVENOUS | Status: AC
  Administered 2023-09-11: 1000 mL via INTRAVENOUS

## 2023-09-11 MED ORDER — DEXTROSE 50 % IV SOLN
INTRAVENOUS | Status: AC
  Filled 2023-09-11: qty 50

## 2023-09-11 MED ORDER — HEPARIN SODIUM (PORCINE) 5000 UNIT/ML IJ SOLN: 5000.0000 [IU] | Freq: Two times a day (BID) | INTRAMUSCULAR | Status: DC

## 2023-09-11 MED ORDER — ACETAMINOPHEN 325 MG PO TABS
650.0000 mg | ORAL_TABLET | ORAL | Status: DC | PRN
Start: 2023-09-11 — End: 2023-09-12

## 2023-09-11 MED ORDER — SODIUM CHLORIDE 0.9 % IV SOLN: INTRAVENOUS | Status: DC

## 2023-09-11 MED ORDER — DEXTROSE-SODIUM CHLORIDE 5-0.45 % IV SOLN: INTRAVENOUS | Status: DC

## 2023-09-11 MED ORDER — DEXTROSE 50 % IV SOLN
25.0000 g | INTRAVENOUS | Status: AC
  Administered 2023-09-11: 25 g via INTRAVENOUS

## 2023-09-11 MED ORDER — ORAL CARE MOUTH RINSE: 15.0000 mL | OROMUCOSAL | Status: DC | PRN

## 2023-09-11 MED ORDER — INSULIN ASPART 100 UNIT/ML IJ SOLN: 0.0000 [IU] | Freq: Three times a day (TID) | INTRAMUSCULAR | Status: DC

## 2023-09-11 MED ORDER — SODIUM CHLORIDE 0.9 % IV BOLUS
1000.0000 mL | Freq: Once | INTRAVENOUS | Status: AC
  Administered 2023-09-11: 1000 mL via INTRAVENOUS

## 2023-09-11 NOTE — ED Notes (Signed)
 Called [facility] to transport patient to [facility] 5W rm# 5

## 2023-09-11 NOTE — Progress Notes (Signed)
 EEG complete - results pending

## 2023-09-11 NOTE — Plan of Care (Signed)
   Problem: Education: Goal: Knowledge of General Education information will improve Description Including pain rating scale, medication(s)/side effects and non-pharmacologic comfort measures Outcome: Progressing   Problem: Clinical Measurements: Goal: Will remain free from infection Outcome: Progressing Goal: Respiratory complications will improve Outcome: Progressing Goal: Cardiovascular complication will be avoided Outcome: Progressing

## 2023-09-11 NOTE — H&P (Signed)
 History and Physical    Patient: Thomas Pham FMW:[id number] DOB: Oct 19, 1955 DOA: 09/11/2023 DOS: the patient was seen and examined on 09/11/2023 . PCP: Lynwood Laneta ORN, PA-C  Patient coming from: [facility] Chief complaint: Chief Complaint  Patient presents with   Altered Mental Status   HPI:  Thomas Pham is a 68 y.o. male with past medical history  of diabetes mellitus type 2, hypogonadism, BPH, essential hypertension, hyperlipidemia, presenting today admitted for altered mental status patient per description when patient was excepted was flailing his limbs and acting crazy and patient admitted for delirium.  On arrival to [facility] patient was found to be somewhat confused and had an episode of similar to the ones he had earlier today where he was flailing all his limbs RN called RRT and blood sugar checked was 49 and given dextrose  at which point patient was back to baseline as far as mentation and mobility. At bedside he is alert awake oriented states that when he had these flailing episodes he was actually aware he was not unconscious dizzy having any other symptoms was aware of what was happening.  This is never happened before.   ED Course:  Vital signs in the ED were notable for the following:  Vitals:   09/11/23 1330 09/11/23 1430 09/11/23 1500 09/11/23 1530  BP: 111/61 108/68 (!) 149/132 104/74  Pulse: 67 86 85 86  Temp:  98.2 F (36.8 C)    Resp: 15 15 17 19   Height:      Weight:      SpO2: 100% 97% 94% 97%  TempSrc:  Oral    BMI (Calculated):      >>ED evaluation thus far shows: CMP showing glucose of 61 BUN of 35 creatinine 2.30 normal LFTs magnesium 2.2. CPK requested was 241 initial troponin of 34. CBC shows white count of 8.6 hemoglobin of 13.7 platelets at 119. Thrombocytopenia has been present since 2011  >>While in the ED patient received the following: Medications  dextrose  50 % solution (  Canceled Entry 09/11/23 1621)  insulin  aspart (novoLOG )  injection 0-9 Units (has no administration in time range)  thiamine  (VITAMIN B1) injection 100 mg (100 mg Intravenous Given 09/11/23 1723)   stroke: early stages of recovery book (has no administration in time range)  0.9 %  sodium chloride  infusion (has no administration in time range)  acetaminophen  (TYLENOL ) tablet 650 mg (has no administration in time range)    Or  acetaminophen  (TYLENOL ) 160 MG/5ML solution 650 mg (has no administration in time range)    Or  acetaminophen  (TYLENOL ) suppository 650 mg (has no administration in time range)  Oral care mouth rinse (has no administration in time range)  lactated ringers  bolus 1,000 mL (0 mLs Intravenous Stopped 09/11/23 1221)  dextrose  50 % solution 25 mL (25 mLs Intravenous Given 09/11/23 1239)  dextrose  50 % solution 25 g (25 g Intravenous Given 09/11/23 1618)  sodium chloride  0.9 % bolus 1,000 mL (1,000 mLs Intravenous New Bag/Given 09/11/23 1727)   Review of Systems  Neurological:        Abnormal movements.  All other systems reviewed and are negative.  Past Medical History:  Diagnosis Date   Allergy    Arthritis    osteo   Basal cell carcinoma 09/13/2014   BCC NODULAR LEFT CHEEK PER ST [facility]   Basal cell carcinoma (BCC) 09/30/2020   SUP & NOD - R UPPER ABDOMEN (CX3FU)   Basal cell carcinoma of skin 09/14/2019  right malar cheek -MOHS   BCC (basal cell carcinoma of skin) 09/09/2017   BCC MICRONODULAR LEFT CHEEK CX3 AND EXC   BCC (basal cell carcinoma of skin) 09/09/2017   BCC POSITIVE MARGIN MOHS DR PEARCE   Chronic kidney disease    Difficulty sleeping    GERD (gastroesophageal reflux disease)    takes nexium for acid reflux as needed   History of gout    History of hiatal hernia    umbilical hernia   History of kidney stones    Hyperlipidemia    Hypertension    Hypothyroidism    Low back pain    Medial meniscus tear    LEFT   Organic impotence    Penile cyst    Sleep apnea    not using mask d/t preference    Thyroid disease    Past Surgical History:  Procedure Laterality Date   KNEE ARTHROSCOPY Left 03/07/2014   Procedure: LEFT ARTHROSCOPY KNEE WITH MEDIAL MENISCECTOMY  DEBRIDEMENT AND CHONDROPLASTY ;  Surgeon: Dempsey Melodi GAILS, MD;  Location: [facility];  Service: Orthopedics;  Laterality: Left;   THYROIDECTOMY Right 2013   TONSILLECTOMY     childhood   TOTAL KNEE ARTHROPLASTY Left 03/04/2016   Procedure: LEFT TOTAL KNEE ARTHROPLASTY;  Surgeon: Dempsey Melodi, MD;  Location: [facility];  Service: Orthopedics;  Laterality: Left;    reports that he has never smoked. He has never used smokeless tobacco. He reports current alcohol use. He reports that he does not use drugs. No Known Allergies Family History  Problem Relation Age of Onset   Hypertension Mother    Diabetes Mother        prediabetes   Diabetes Father    Heart disease Father    Arthritis Father    Other Father        alzheimer   Hyperlipidemia Father    Hypertension Father    Stroke Maternal Grandmother    Other Paternal Grandmother    Diabetes Paternal Grandmother    Heart disease Paternal Grandfather    Other Paternal Grandfather        migraine headache   Prior to Admission medications   Medication Sig Start Date End Date Taking? Authorizing Provider  aspirin  EC 81 MG tablet Take 81 mg by mouth daily.   Yes [provider]  calcium carbonate (OS-CAL) 600 MG tablet Take 600 mg by mouth 2 (two) times daily.   Yes [provider]  celecoxib (CELEBREX) 200 MG capsule Take 200 mg by mouth daily as needed. 08/01/18  Yes [provider]  cetirizine (ZYRTEC) 10 MG tablet Take 10 mg by mouth daily. For extreme itching   Yes [provider]  Cholecalciferol (VITAMIN D-3) 25 MCG (1000 UT) CAPS Take 1 capsule by mouth in the morning and at bedtime.   Yes [provider]  clobetasol ointment (TEMOVATE) 0.05 % Apply 1 Application topically 2 (two) times daily as needed.   Yes [provider]   clotrimazole-betamethasone (LOTRISONE) cream Apply 1 Application topically 2 (two) times daily as needed.   Yes [provider]  Dupilumab  (DUPIXENT ) 300 MG/2ML SOPN Inject 300 mg into the skin every 14 (fourteen) days. Starting at day 15 for maintenance. 09/16/21  Yes Livingston Rigg, MD  esomeprazole (NEXIUM) 40 MG capsule Take 40 mg by mouth 2 (two) times daily before a meal.   Yes [provider]  famotidine  (PEPCID ) 40 MG tablet Take 40 mg by mouth daily.   Yes [provider]  febuxostat  (ULORIC ) 40 MG tablet Take 40 mg by mouth daily.   Yes [provider]  fexofenadine (ALLEGRA) 180 MG tablet Take 180 mg by mouth every evening.   Yes [provider]  flunisolide (NASALIDE) 25 MCG/ACT (0.025%) SOLN Place 1 spray into the nose 2 (two) times daily. 09/17/20  Yes [provider]  furosemide (LASIX) 20 MG tablet Take 20 mg by mouth daily as needed. 07/18/18  Yes [provider]  gabapentin  (NEURONTIN ) 800 MG tablet Take 800 mg by mouth 2 (two) times daily.   Yes [provider]  glimepiride (AMARYL) 4 MG tablet Take 8 mg by mouth every morning. 04/19/20  Yes [provider]  HYDROcodone -acetaminophen  (NORCO/VICODIN) 5-325 MG tablet Take 1 tablet by mouth every 6 (six) hours as needed.   Yes [provider]  JARDIANCE 25 MG TABS tablet Take 25 mg by mouth daily. 03/24/19  Yes [provider]  levothyroxine  (SYNTHROID , LEVOTHROID) 75 MCG tablet Take 75 mcg by mouth daily before breakfast.   Yes [provider]  melatonin 5 MG TABS Take 5 mg by mouth at bedtime.   Yes [provider]  Methylnaltrexone  Bromide (RELISTOR ) 150 MG TABS Take 450 mg by mouth daily.   Yes [provider]  olmesartan -hydrochlorothiazide  (BENICAR  HCT) 20-12.5 MG tablet Take 0.5 tablets by mouth daily.   Yes [provider]  potassium chloride (KLOR-CON) 10 MEQ tablet Take 10 mEq by mouth daily as  needed.   Yes [provider]  simvastatin  (ZOCOR ) 40 MG tablet Take 40 mg by mouth daily at 6 PM.    Yes [provider]  tadalafil (CIALIS) 5 MG tablet Take 5 mg by mouth daily as needed for erectile dysfunction.   Yes [provider]  tirzepatide CLOYDE) 5 MG/0.5ML Pen Inject 5 mg into the skin once a week.   Yes [provider]  amitriptyline (ELAVIL) 25 MG tablet TAKE 1 TABLET EVERY NIGHT Patient not taking: Reported on 06/26/2021 08/08/20   [provider]  dexamethasone  (DECADRON ) 1 MG/ML solution Take by mouth. Patient not taking: Reported on 06/26/2021    [provider]  dexamethasone  0.5 MG/5ML elixir Take by mouth. Patient not taking: Reported on 06/26/2021 08/02/19   [provider]  flunisolide (NASAREL) 29 MCG/ACT (0.025%) nasal spray Place 2 sprays into the nose as needed for rhinitis. Dose is for each nostril.    [provider]  gentamicin  cream (GARAMYCIN ) 0.1 % Apply 1 Application topically 2 (two) times daily. Patient not taking: Reported on 09/11/2023 12/17/21   Janit Thresa HERO, DPM  glimepiride (AMARYL) 2 MG tablet  06/07/18   [provider]  Methylnaltrexone  Bromide 150 MG TABS Take 150 mg by mouth daily. Patient not taking: Reported on 06/26/2021    [provider]  Testosterone Undecanoate  (JATENZO ) 237 MG CAPS Take 237 mg by mouth 2 (two) times daily. Take with food 06/26/21   Stoneking, Adine PARAS., MD  valACYclovir (VALTREX) 1000 MG tablet valacyclovir 1 gram tablet Patient not taking: Reported on 06/26/2021    [provider]  Vitals:   09/11/23 1330 09/11/23 1430 09/11/23 1500 09/11/23 1530  BP: 111/61 108/68 (!) 149/132 104/74  Pulse: 67 86 85 86  Resp: 15 15 17 19   Temp:  98.2 F (36.8 C)    TempSrc:  Oral    SpO2: 100% 97% 94% 97%  Weight:      Height:       Physical Exam Vitals and  nursing note reviewed.  Constitutional:      General: He is not in acute distress.    Appearance: He is not ill-appearing.  HENT:     Head: Normocephalic and atraumatic.     Right Ear: Hearing and external ear normal.     Left Ear: Hearing and external ear normal.     Nose: No nasal deformity.     Mouth/Throat:     Lips: Pink.  Eyes:     General: Lids are normal.     Extraocular Movements: Extraocular movements intact.  Cardiovascular:     Rate and Rhythm: Normal rate and regular rhythm.     Pulses: Normal pulses.     Heart sounds: Normal heart sounds.  Pulmonary:     Effort: Pulmonary effort is normal.     Breath sounds: Normal breath sounds.  Abdominal:     General: Bowel sounds are normal. There is no distension.     Palpations: Abdomen is soft. There is no mass.     Tenderness: There is no abdominal tenderness.  Musculoskeletal:     Right lower leg: No edema.     Left lower leg: No edema.  Skin:    General: Skin is warm.  Neurological:     General: No focal deficit present.     Mental Status: He is alert and oriented to person, place, and time.     Cranial Nerves: Cranial nerves 2-12 are intact. No cranial nerve deficit, dysarthria or facial asymmetry.     Motor: No weakness, tremor, atrophy, abnormal muscle tone, seizure activity or pronator drift.     Coordination: Coordination normal. Finger-Nose-Finger Test and Heel to [facility] Test normal.     Deep Tendon Reflexes:     Reflex Scores:      Bicep reflexes are 2+ on the right side and 2+ on the left side.      Patellar reflexes are 2+ on the right side and 2+ on the left side. Psychiatric:        Attention and Perception: Attention normal.        Mood and Affect: Mood normal.        Speech: Speech normal.        Behavior: Behavior normal. Behavior is cooperative.     Labs on Admission: I have personally reviewed following labs and imaging studies CBC: Recent Labs  Lab 09/11/23 1058  WBC 8.6  NEUTROABS 6.9  HGB  13.7  HCT 42.7  MCV 82.3  PLT 119*   Basic Metabolic Panel: Recent Labs  Lab 09/11/23 1058 09/11/23 1400  NA 139  --   K 3.7  --   CL 102  --   CO2 25  --   GLUCOSE 61*  --   BUN 35*  --   CREATININE 2.30*  --   CALCIUM 9.6  --   MG  --  2.2   GFR: Estimated Creatinine Clearance: 36.2 mL/min (A) (by C-G formula based on SCr of 2.3 mg/dL (H)). Liver Function Tests: Recent Labs  Lab 09/11/23 1058  AST 27  ALT  16  ALKPHOS 81  BILITOT 0.6  PROT 6.9  ALBUMIN 4.2   Recent Labs  Lab 09/11/23 1058  LIPASE 32   No results for input(s): AMMONIA in the last 168 hours. Coagulation Profile: No results for input(s): INR, PROTIME in the last 168 hours. Cardiac Enzymes: Recent Labs  Lab 09/11/23 1400  CKTOTAL 241   BNP (last 3 results) No results for input(s): PROBNP in the last 8760 hours. HbA1C: Recent Labs    09/11/23 1638  HGBA1C 5.8*   CBG: Recent Labs  Lab 09/11/23 1051 09/11/23 1300 09/11/23 1615 09/11/23 1638  GLUCAP 78 83 49* 156*   Lipid Profile: No results for input(s): CHOL, HDL, LDLCALC, TRIG, CHOLHDL, LDLDIRECT in the last 72 hours. Thyroid Function Tests: No results for input(s): TSH, T4TOTAL, FREET4, T3FREE, THYROIDAB in the last 72 hours. Anemia Panel: No results for input(s): VITAMINB12, FOLATE, FERRITIN, TIBC, IRON, RETICCTPCT in the last 72 hours. Urine analysis:    Component Value Date/Time   COLORURINE COLORLESS (A) 09/11/2023 1058   APPEARANCEUR CLEAR 09/11/2023 1058   APPEARANCEUR Clear 06/26/2021 0911   LABSPEC 1.005 09/11/2023 1058   PHURINE 5.5 09/11/2023 1058   GLUCOSEU 250 (A) 09/11/2023 1058   HGBUR NEGATIVE 09/11/2023 1058   BILIRUBINUR NEGATIVE 09/11/2023 1058   BILIRUBINUR Negative 06/26/2021 0911   KETONESUR NEGATIVE 09/11/2023 1058   PROTEINUR NEGATIVE 09/11/2023 1058   UROBILINOGEN 0.2 03/02/2014 0938   NITRITE NEGATIVE 09/11/2023 1058   LEUKOCYTESUR NEGATIVE 09/11/2023  1058   Radiological Exams on Admission: CT Head Wo Contrast Result Date: 09/11/2023 CLINICAL DATA:  Mental status change, unknown cause. EXAM: CT HEAD WITHOUT CONTRAST TECHNIQUE: Contiguous axial images were obtained from the base of the skull through the vertex without intravenous contrast. RADIATION DOSE REDUCTION: This exam was performed according to the departmental dose-optimization program which includes automated exposure control, adjustment of the mA and/or kV according to patient size and/or use of iterative reconstruction technique. COMPARISON:  CTA head and neck 06/12/2022 FINDINGS: Brain: There is no evidence of an acute infarct, intracranial hemorrhage, mass, midline shift, or extra-axial fluid collection. Cerebral volume is normal. The ventricles are normal in size. Vascular: Calcified atherosclerosis at the skull base. No hyperdense vessel. Skull: No fracture or suspicious lesion. Sinuses/Orbits: Visualized paranasal sinuses and mastoid air cells are clear. Unremarkable orbits. Other: None. IMPRESSION: Unremarkable CT appearance of the brain. Electronically Signed   By: Dasie Hamburg M.D.   On: 09/11/2023 10:56   Data Reviewed: Relevant notes from primary care and specialist visits, past discharge summaries as available in EHR, including Care Everywhere . Prior diagnostic testing as pertinent to current admission diagnoses, Updated medications and problem lists for reconciliation .ED course, including vitals, labs, imaging, treatment and response to treatment,Triage notes, nursing and pharmacy notes and ED provider's notes.Notable results as noted in HPI.Discussed case with EDMD/ ED APP/ or Specialty MD on call and as needed.  Assessment & Plan  >> Abnormal movement/delirium versus seizure-like activity from hypoglycemia: Patient admitted to progressive unit for ruling out seizures, TIA, acute onset delirium. Initially thought of possible heatstroke, will admit and CVA order set utilized.   Per neurology MRI of the brain and EEG requested.  >> Diabetes mellitus type 2: Patient to continue glycemic protocol based sliding scale coverage.  Home regimen of Jardiance held as patient's A1c is controlled and also at blood pressure is low suspect Jardiance is causing hypotension.  Also held glimepiride because it is predisposing patient to have hypoglycemic episodes.   >>  Essential hypertension: Vitals:   09/11/23 1028 09/11/23 1130 09/11/23 1215 09/11/23 1230  BP: 102/62 104/66 109/71 110/72   09/11/23 1330 09/11/23 1430 09/11/23 1500 09/11/23 1530  BP: 111/61 108/68 (!) 149/132 104/74  Due to low blood pressures patient's olmesartan  HCTZ held.  Lasix held. PRN hydralazine .   >>AKI: Lab Results  Component Value Date   CREATININE 2.30 (H) 09/11/2023   CREATININE 1.17 03/05/2016   CREATININE 1.33 (H) 02/28/2016  Hold Jardiance.  Hold ARB. Hold Diuretic.  Avoid contrast.  Renally dose meds. Bladder scan PRN. Nephrology consult as deemed appropriate.   >>Thrombocytopenia:  05/28/09 01:12 05/29/09 04:54 07/01/09 19:33 07/03/09 05:55 11/29/12 06:30 11/02/13 09:30 03/02/14 09:35 02/28/16 11:30 03/05/16 04:40 09/11/23 10:58  Platelets 134 (L) 123 (L) 132 (L) 113 (L) 151 142 (L) 156 112 (L) 111 (L) 119 (L)  Hold heparin  . Outpatient hem onc consult.    >>Hypothyroidism: Cont levothyroxine  at 75 mcg.    DVT prophylaxis:  Scd's  Consults:  Neurology.   Advance Care Planning:    Code Status: Full Code   Family Communication:  None  Disposition Plan:  Home.  Severity of Illness: The appropriate patient status for this patient is INPATIENT. Inpatient status is judged to be reasonable and necessary in order to provide the required intensity of service to ensure the patient's safety. The patient's presenting symptoms, physical exam findings, and initial radiographic and laboratory data in the context of their chronic comorbidities is felt to place them at high risk for  further clinical deterioration. Furthermore, it is not anticipated that the patient will be medically stable for discharge from the hospital within 2 midnights of admission.   * I certify that at the point of admission it is my clinical judgment that the patient will require inpatient hospital care spanning beyond 2 midnights from the point of admission due to high intensity of service, high risk for further deterioration and high frequency of surveillance required.*  Unresulted Labs (From admission, onward)     Start     Ordered   09/11/23 1729  HIV Antibody (routine testing w rflx)  (HIV Antibody (Routine testing w reflex) panel)  Once,   R        09/11/23 1731   09/11/23 1729  Urine rapid drug screen (hosp performed)not at [facility])  Once,   R        09/11/23 1731            Meds ordered this encounter  Medications   lactated ringers  bolus 1,000 mL   dextrose  50 % solution 25 mL   dextrose  50 % solution 25 g    Indication:   Hypoglycemia orders for diabetic adults and all adult patients on insulin  or other diabetes medications.    Documentation:   Document in progress notes using SmartText for hypoglycemia.   dextrose  50 % solution    Campbell, Sania E: cabinet override   sodium chloride  0.9 % bolus 1,000 mL   insulin  aspart (novoLOG ) injection 0-9 Units    Correction coverage::   Sensitive (thin, NPO, renal)    CBG < 70::   Implement Hypoglycemia Standing Orders and refer to Hypoglycemia Standing Orders sidebar report    CBG 70 - 120::   0 units    CBG 121 - 150::   1 unit    CBG 151 - 200::   2 units    CBG 201 - 250::   3 units    CBG 251 -  300::   5 units    CBG 301 - 350::   7 units    CBG 351 - 400:   9 units    CBG > 400:   call MD and obtain STAT lab verification   thiamine  (VITAMIN B1) injection 100 mg    stroke: early stages of recovery book   0.9 %  sodium chloride  infusion   OR Linked Order Group    acetaminophen  (TYLENOL ) tablet 650 mg    acetaminophen   (TYLENOL ) 160 MG/5ML solution 650 mg    acetaminophen  (TYLENOL ) suppository 650 mg   DISCONTD: heparin  injection 5,000 Units   Oral care mouth rinse     Orders Placed This Encounter  Procedures   CT Head Wo Contrast   MR Brain W and Wo Contrast   CBC with Differential   Comprehensive metabolic panel   Lipase, blood   Urinalysis, Routine w reflex microscopic -Urine, Clean Catch   CK   Ethanol   C-reactive protein   Magnesium   Glucose, capillary   Hemoglobin A1c   Glucose, capillary   HIV Antibody (routine testing w rflx)   Urine rapid drug screen (hosp performed)not at [facility]   Diet Carb Modified Fluid consistency: Thin; Room service appropriate? Yes   Cardiac Monitoring - Continuous Indefinite   Apply Diabetes Mellitus Care Plan   STAT CBG when hypoglycemia is suspected. If treated, recheck every 15 minutes after each treatment until CBG >/= 70 mg/dl   Refer to Hypoglycemia Protocol Sidebar Report for treatment of CBG < 70 mg/dl   No HS correction Insulin    NIHSS score documentation NIHSS score range: 0-42   Patient has an active order for admit to inpatient/place in observation   Vital signs   Notify physician (specify)   OOB with assistance   Activity as tolerated   Swallow screen - If patient does NOT pass this screen, place order for SLP eval and treat (SLP2) - swallowing evaluation (BSE, MBS and/or diet order as indicated)   NIH Stroke Scale   Intake and output   Apply Stroke Care Plan: Ischemic Stroke, TIA   Initiate Adult Central Line Maintenance and Catheter Protocol for patients with central line (CVC, PICC, Port, Hemodialysis, Trialysis)   Discuss with patient and document patient's goals for stroke risk factor reduction   Initiate Oral Care Protocol   Initiate Carrier Fluid Protocol   Provide stroke education material to patient and family.   Nurse to provide smoking / tobacco cessation education   If the patient has passed the Stroke Swallow Screen or has a  feeding tube, then RN may order General Admission PRN Orders (through manage orders) for the following patient needs: allergy symptoms (Claritin ), cold sores (Carmex), cough (Robitussin DM), eye irritation (Liquifilm Tears), hemorrhoids (Tucks), indigestion (Maalox), minor skin irritation (hydrocortisone cream), muscle pain Lucienne Gay), nose irritation (saline nasal spray) and sore throat (Chloraseptic spray).   Swallow screen   Complete oral care assessment tool on admission, transfer, and q shift   Refer to Sidebar Report Adult Oral Care Protocol   Brush teeth with toothbrush and toothpaste 3 times daily   Apply moisturizer in mouth and lips prn   Full code   Consult to neurology   Consult to hospitalist   Consult to Registered Dietitian   Consult to Transition of Care Team   OT eval and treat   PT eval and treat   Oxygen therapy Mode or (Route): Nasal cannula; Liters Per Minute: 2; Keep O2  saturation between: greater than 94 %   SLP eval and treat Reason for evaluation: Cognitive/Language evaluation   SLP eval and treat Reason for evaluation: .Swallowing evaluation (BSE, MBS and/or diet order as indicated)   CBG monitoring, ED   CBG monitoring, ED   EKG 12-Lead   ECHOCARDIOGRAM COMPLETE   EEG adult   Insert peripheral IV   Admit to Inpatient (patient's expected length of stay will be greater than 2 midnights or inpatient only procedure)   Fall precautions   Seizure precautions   Aspiration precautions   VAS US  CAROTID (at [facility] and [facility] only)    Author: Mario LULLA Blanch, MD 12 pm- 8 pm. [facility]. 09/11/2023 6:46 PM Please note for any communication after hours contact [facility] Assigned provider on call on Amion.

## 2023-09-11 NOTE — ED Triage Notes (Signed)
 Woke up this morning at approx 0730 and his mother reports that he was acting crazy. Reports in the heat all this week and worked in the yard yesterday, felt dizzy and weak and stumbled. No fall. Felt normal after the episode and felt fine before going to bed. CA&OX4. Denies N/V. Reports diarrhea this am.

## 2023-09-11 NOTE — Plan of Care (Signed)
 Discussed with Dr. Ula  Episode lasting a few minutes -- kicking right leg back and forth, mom asked him what he was doing and he was just repeating what she said back to her. Snapped out of it and immediately back to his normal self, patient doesn't remember that specific episode but notes being overheated working outside yesterday. 100% back to normal in the ED   Labs notable for   Glucose 61 AKI to 2.3  Head CT negative   Being admitted for AKI  Recommendations:  Given his recent diagnosis of prostate cancer I think it is reasonable to obtain MRI brain with and without contrast and routine EEG for screening but certainly this is an atypical event.  If these studies are reassuring and if he has no further episodes while in the hospital being treated for toxic/metabolic issues I do not think he would need additional evaluation inpatient and could follow-up outpatient.  However certainly if he has further spells of abnormal behavior or if there are any concerning abnormalities on this testing please do reach out to neurology for full consultation  These are curbside recommendations based upon the information readily available in the chart on brief review as well as history and examination information provided to me by requesting provider and do not replace a full detailed consult  Lola Jernigan MD-PhD [facility] [phone] Available 8 AM to 8 PM, outside these hours please contact Neurologist on call listed on AMION

## 2023-09-11 NOTE — Significant Event (Signed)
 Rapid Response Event Note   Reason for Call :  On arrival from med center [place] pt was confused and acting odd per staff  BP 115/70  HR 113  RR 40 CBG 49  Interventions:  1/2 amp D50 given IV  After D50 given patient is alert and oriented able to carry on a complete conversation. No focal deficits noted  Plan of Care:  Recheck CBG in an hour RN to call if further assistance needed.    Event Summary:   MD Notified: Mario Blanch notified by RN Call Time: [phone]   Arrival Time: 1620 End Time: 1645  Elvin Portland, RN

## 2023-09-11 NOTE — Progress Notes (Signed)
 Call [facility] at [phone] re: [name], Pfeifer / Room: [facility] MRN [id number].  Dr.Tee: 68 y/o h/o DM/ Prostate cancer, DELIRIUM, involuntary movement of right leg.   0730 and his mother reports that he was acting crazy. Reports in the heat all this week and worked in the yard yesterday, felt dizzy and weak and stumbled. No fall. Felt normal after the episode and felt fine before going to bed. CA&OX4. Denies N/V. Reports diarrhea this am.              Neuro wants EEG/ MRI. Pulse are ok.   Vitals:   09/11/23 1130 09/11/23 1215 09/11/23 1230 09/11/23 1330  BP: 104/66 109/71 110/72 111/61  Pulse: 68 64 63 67  Temp:      Resp: 15 15 18 15   Height:      Weight:      SpO2: 98% 99% 100% 100%  TempSrc:      BMI (Calculated):       Lab Results  Component Value Date   CREATININE 2.30 (H) 09/11/2023   CREATININE 1.17 03/05/2016   CREATININE 1.33 (H) 02/28/2016       Latest Ref Rng & Units 09/11/2023   10:58 AM 03/05/2016    4:40 AM 02/28/2016   11:30 AM  CMP  Glucose 70 - 99 mg/dL 61  831  817   BUN 8 - 23 mg/dL 35  16  19   Creatinine 0.61 - 1.24 mg/dL 7.69  8.82  8.66   Sodium 135 - 145 mmol/L 139  140  137   Potassium 3.5 - 5.1 mmol/L 3.7  4.0  3.5   Chloride 98 - 111 mmol/L 102  107  101   CO2 22 - 32 mmol/L 25  24  27    Calcium 8.9 - 10.3 mg/dL 9.6  8.3  9.0   Total Protein 6.5 - 8.1 g/dL 6.9   7.2   Total Bilirubin 0.0 - 1.2 mg/dL 0.6   0.7   Alkaline Phos 38 - 126 U/L 81   72   AST 15 - 41 U/L 27   29   ALT 0 - 44 U/L 16   31    Admission accepted suspect delirium of acute onset secondary to either heatstroke or infection or cardiac issue or neurological issue and we will proceed with TIA workup and have added on ethanol and CPK. After exam will consider low back xray or mri depending on exam.

## 2023-09-11 NOTE — Procedures (Signed)
 Patient Name: YORDI KRAGER  MRN: [id number]  Epilepsy Attending: [name]  Referring Physician/Provider:  Date: 09/11/2023 Duration: 22 minutes   Patient history: 68 year old man with altered mental status, EEG to assess for seizure   Level of alertness: Awake, drowsy, sleep  AEDs during EEG study: None   Technical aspects: This EEG study was done with scalp electrodes positioned according to the 10-20 International system of electrode placement. Electrical activity was reviewed with band pass filter of 1-70Hz , sensitivity of 7 uV/mm, display speed of 54mm/sec with a 60Hz  notched filter applied as appropriate. EEG data were recorded continuously and digitally stored.  Video monitoring was available and reviewed as appropriate.  Description: The posterior dominant rhythm consists of 8.5 Hz activity of moderate voltage (25-35 uV) seen predominantly in posterior head regions, symmetric and reactive to eye opening and eye closing. Drowsiness was characterized by attenuation of the posterior background rhythm. Sleep was characterized by vertex waves, sleep spindles (12 to 14 Hz), maximal frontocentral region.  Hyperventilation was not performed. Physiologic photic driving was not seen during photic stimulation.    ABNORMALITY -None   IMPRESSION: This study is within normal limits. No seizures or epileptiform discharges were seen throughout the recording. A normal interictal EEG does not exclude nor support the diagnosis of epilepsy.   [name] MD  Neurology

## 2023-09-11 NOTE — ED Provider Notes (Signed)
 [facility]ENC[facility] DEPARTMENT AT [facility] Provider Note   CSN: [id number] Arrival date & time: 09/11/23  1015     Patient presents with: Altered Mental Status   SHADOW SCHEDLER is a 68 y.o. male.   68 year old male with past medical history of prostate cancer and diabetes presenting for emergency department today after having an episode of altered mental status that is since resolved.  The patient apparently has been having behaviors this morning.  He apparently was repeating things when his mother was speaking with him and was moving one of his legs and he ran abnormal way.  When he was confronted about this he was repeating what his mother was saying.  The patient states that he is feeling back to normal and is now.  He does not recall this.  He states that he did work outside yesterday and did get overheated.  He states that he was feeling better before he went to bed last night.  He denies any fever or chills.  Denies any headache or neck pain.  He came to the emergency department for further evaluation regarding this.  Denies any new medications.  He has been recently diagnosed with prostate cancer and is scheduled for prostatectomy earlier in the month in August.   Altered Mental Status Presenting symptoms: confusion        Prior to Admission medications   Medication Sig Start Date End Date Taking? Authorizing Provider  amitriptyline (ELAVIL) 25 MG tablet TAKE 1 TABLET EVERY NIGHT Patient not taking: Reported on 06/26/2021 08/08/20   [provider]  aspirin  EC 81 MG tablet Take as directed.    [provider]  calcium carbonate (OS-CAL) 600 MG tablet Take 600 mg by mouth daily.    [provider]  celecoxib (CELEBREX) 200 MG capsule  08/01/18   [provider]  cetirizine (ZYRTEC) 10 MG tablet Take 10 mg by mouth at bedtime. For extreme itching    [provider]  dexamethasone  (DECADRON ) 1 MG/ML solution Take by  mouth. Patient not taking: Reported on 06/26/2021    [provider]  dexamethasone  0.5 MG/5ML elixir Take by mouth. Patient not taking: Reported on 06/26/2021 08/02/19   [provider]  Dupilumab  (DUPIXENT ) 300 MG/2ML SOPN Inject 300 mg into the skin every 14 (fourteen) days. Starting at day 15 for maintenance. 09/16/21   Livingston Rigg, MD  famotidine  (PEPCID ) 40 MG tablet Take 40 mg by mouth 2 (two) times daily.    [provider]  febuxostat  (ULORIC ) 40 MG tablet Take 40 mg by mouth daily.    [provider]  fexofenadine (ALLEGRA) 180 MG tablet Take by mouth.    [provider]  flunisolide (NASALIDE) 25 MCG/ACT (0.025%) SOLN SMARTSIG:1-2 Spray(s) Both Nares Every 12 Hours 09/17/20   [provider]  flunisolide (NASAREL) 29 MCG/ACT (0.025%) nasal spray Place 2 sprays into the nose as needed for rhinitis. Dose is for each nostril.    [provider]  furosemide (LASIX) 20 MG tablet  07/18/18   [provider]  gabapentin  (NEURONTIN ) 600 MG tablet Take 600 mg by mouth 3 (three) times daily. 08/08/20   [provider]  gentamicin  cream (GARAMYCIN ) 0.1 % Apply 1 Application topically 2 (two) times daily. 12/17/21   Janit Thresa HERO, DPM  glimepiride (AMARYL) 2 MG tablet  06/07/18   [provider]  glimepiride (AMARYL) 4 MG tablet Take 4 mg by mouth every morning. 04/19/20   [provider]  Glucosamine-Chondroitin (GLUCOSAMINE CHONDR COMPLEX PO) Take 1 tablet by mouth daily.    [provider]  HYDROcodone -acetaminophen  (NORCO/VICODIN) 5-325 MG tablet Take by mouth.    [provider]  JARDIANCE 25 MG TABS tablet Take 25 mg by mouth daily. 03/24/19   [provider]  ketoconazole (NIZORAL) 2 % shampoo     [provider]  levothyroxine  (SYNTHROID , LEVOTHROID) 75 MCG tablet Take 75 mcg by mouth daily before breakfast.    [provider]  Melatonin 3 MG TABS Take 0.5  tablets by mouth at bedtime.    [provider]  Methylnaltrexone  Bromide 150 MG TABS Take 150 mg by mouth daily. Patient not taking: Reported on 06/26/2021    [provider]  olmesartan -hydrochlorothiazide  (BENICAR  HCT) 20-12.5 MG tablet Take 1 tablet by mouth daily.    [provider]  pantoprazole  (PROTONIX ) 40 MG tablet pantoprazole  40 mg tablet,delayed release 05/17/19   [provider]  sildenafil (VIAGRA) 100 MG tablet Take 100 mg by mouth daily as needed for erectile dysfunction.    [provider]  simvastatin  (ZOCOR ) 40 MG tablet Take 40 mg by mouth daily at 6 PM.     [provider]  Testosterone Undecanoate  (JATENZO ) 237 MG CAPS Take 237 mg by mouth 2 (two) times daily. Take with food 06/26/21   Stoneking, Adine PARAS., MD  valACYclovir (VALTREX) 1000 MG tablet valacyclovir 1 gram tablet Patient not taking: Reported on 06/26/2021    [provider]  Vitamin D, Ergocalciferol, (DRISDOL) 1.25 MG (50000 UT) CAPS capsule TAKE 1 CAPSULE EVERY 2 WEEKS AS DIRECTED 02/14/18   [provider]    Allergies: Patient has no known allergies.    Review of Systems  Psychiatric/Behavioral:  Positive for confusion.   All other systems reviewed and are negative.   Updated Vital Signs BP 108/68 (BP Location: Right Arm)   Pulse 86   Temp 98.2 F (36.8 C) (Oral)   Resp 15   Ht 5' 9 (1.753 m)   Wt 93.9 kg   SpO2 97%   BMI 30.57 kg/m   Physical Exam Vitals and nursing note reviewed.   Gen: NAD Eyes: PERRL, EOMI HEENT: no oropharyngeal swelling Neck: trachea midline, no meningismus Resp: clear to auscultation bilaterally Card: RRR, no murmurs, rubs, or gallops Abd: nontender, nondistended Extremities: no calf tenderness, no edema Vascular: 2+ radial pulses bilaterally, 2+ DP pulses bilaterally Neuro: Cranial nerves intact, equal strength sensation throughout bilateral upper and lower extremities with no dysmetria  incontinence testing Skin: no rashes Psyc: acting appropriately   (all labs ordered are listed, but only abnormal results are displayed) Labs Reviewed  CBC WITH DIFFERENTIAL/PLATELET - Abnormal; Notable for the following components:      Result Value   RDW 22.9 (*)    Platelets 119 (*)    All other components within normal limits  COMPREHENSIVE METABOLIC PANEL WITH GFR - Abnormal; Notable for the following components:   Glucose, Bld 61 (*)    BUN 35 (*)    Creatinine, Ser 2.30 (*)    GFR, Estimated 31 (*)    All other components within normal limits  URINALYSIS, ROUTINE W REFLEX MICROSCOPIC - Abnormal; Notable for the following components:   Color, Urine COLORLESS (*)    Glucose, UA 250 (*)    All other components within normal limits  TROPONIN T, HIGH SENSITIVITY - Abnormal; Notable for the following components:   Troponin T High Sensitivity 34 (*)  All other components within normal limits  LIPASE, BLOOD  CK  ETHANOL  MAGNESIUM  C-REACTIVE PROTEIN  CBG MONITORING, ED  CBG MONITORING, ED    EKG: None  Radiology: CT Head Wo Contrast Result Date: 09/11/2023 CLINICAL DATA:  Mental status change, unknown cause. EXAM: CT HEAD WITHOUT CONTRAST TECHNIQUE: Contiguous axial images were obtained from the base of the skull through the vertex without intravenous contrast. RADIATION DOSE REDUCTION: This exam was performed according to the departmental dose-optimization program which includes automated exposure control, adjustment of the mA and/or kV according to patient size and/or use of iterative reconstruction technique. COMPARISON:  CTA head and neck 06/12/2022 FINDINGS: Brain: There is no evidence of an acute infarct, intracranial hemorrhage, mass, midline shift, or extra-axial fluid collection. Cerebral volume is normal. The ventricles are normal in size. Vascular: Calcified atherosclerosis at the skull base. No hyperdense vessel. Skull: No fracture or suspicious lesion.  Sinuses/Orbits: Visualized paranasal sinuses and mastoid air cells are clear. Unremarkable orbits. Other: None. IMPRESSION: Unremarkable CT appearance of the brain. Electronically Signed   By: Dasie Hamburg M.D.   On: 09/11/2023 10:56     Procedures   Medications Ordered in the ED  lactated ringers  bolus 1,000 mL (0 mLs Intravenous Stopped 09/11/23 1221)  dextrose  50 % solution 25 mL (25 mLs Intravenous Given 09/11/23 1239)                                    Medical Decision Making 68 year old male with past medical history of diabetes and prostate cancer presents emergency department today with an episode of altered mental status that has since resolved.  No further evaluate patient here with basic labs to evaluate for electrolyte abnormalities and obtain a CT scan of his head to evaluate for possible lesion as patient does have prostate cancer.  Patient will be fluids given.  He is complaining of being oversedated yesterday.  Questioning it atypical seizure activity.  The patient is back to his baseline currently.  Will reevaluate for ultimate disposition.  He does not have any findings on exam consistent with meningitis and is back to his baseline currently so suspicion for encephalitis is low at this time.  The patient's initial workup is reassuring with exception of AKI.  The patient's glucose was also low.  Unclear if this was playing a role earlier.  I did call discussed this with Dr. Jerrie.  She recommends MRI of the brain with and without contrast as well as routine EEG.  A call was placed to hospitalist service for admission.  Patient given IV fluids here.  He will be admitted for further evaluation for possible focal seizure although this may be related to symptomatic hypoglycemia.  Amount and/or Complexity of Data Reviewed Labs: ordered. Radiology: ordered.  Risk Prescription drug management. Decision regarding hospitalization.        Final diagnoses:  AKI (acute kidney  injury) [facility])  Hypoglycemia    ED Discharge Orders     None          Ula Prentice SAUNDERS, MD 09/11/23 1512

## 2023-09-12 ENCOUNTER — Inpatient Hospital Stay (HOSPITAL_COMMUNITY)

## 2023-09-12 DIAGNOSIS — R410 Disorientation, unspecified: Secondary | ICD-10-CM

## 2023-09-12 LAB — GLUCOSE, CAPILLARY
Glucose-Capillary: 76 mg/dL (ref 70–99)
Glucose-Capillary: 134 mg/dL — ABNORMAL HIGH (ref 70–99)

## 2023-09-12 LAB — CBC WITH DIFFERENTIAL/PLATELET
Abs Immature Granulocytes: 0.04 K/uL (ref 0.00–0.07)
Basophils Absolute: 0 K/uL (ref 0.0–0.1)
Basophils Relative: 1 %
Eosinophils Absolute: 0.1 K/uL (ref 0.0–0.5)
Eosinophils Relative: 1 %
HCT: 42.9 % (ref 39.0–52.0)
Hemoglobin: 13.5 g/dL (ref 13.0–17.0)
Immature Granulocytes: 1 %
Lymphocytes Relative: 19 %
Lymphs Abs: 1.2 K/uL (ref 0.7–4.0)
MCH: 25.8 pg — ABNORMAL LOW (ref 26.0–34.0)
MCHC: 31.5 g/dL (ref 30.0–36.0)
MCV: 82 fL (ref 80.0–100.0)
Monocytes Absolute: 0.5 K/uL (ref 0.1–1.0)
Monocytes Relative: 8 %
Neutro Abs: 4.7 K/uL (ref 1.7–7.7)
Neutrophils Relative %: 70 %
Platelets: 115 K/uL — ABNORMAL LOW (ref 150–400)
RBC: 5.23 MIL/uL (ref 4.22–5.81)
RDW: 22.9 % — ABNORMAL HIGH (ref 11.5–15.5)
WBC: 6.6 K/uL (ref 4.0–10.5)
nRBC: 0 % (ref 0.0–0.2)

## 2023-09-12 LAB — COMPREHENSIVE METABOLIC PANEL WITH GFR
ALT: 15 U/L (ref 0–44)
AST: 20 U/L (ref 15–41)
Albumin: 3.3 g/dL — ABNORMAL LOW (ref 3.5–5.0)
Alkaline Phosphatase: 58 U/L (ref 38–126)
Anion gap: 12 (ref 5–15)
BUN: 20 mg/dL (ref 8–23)
CO2: 25 mmol/L (ref 22–32)
Calcium: 8.6 mg/dL — ABNORMAL LOW (ref 8.9–10.3)
Chloride: 106 mmol/L (ref 98–111)
Creatinine, Ser: 1.64 mg/dL — ABNORMAL HIGH (ref 0.61–1.24)
GFR, Estimated: 46 mL/min — ABNORMAL LOW (ref >60)
Glucose, Bld: 73 mg/dL (ref 70–99)
Potassium: 3.6 mmol/L (ref 3.5–5.1)
Sodium: 143 mmol/L (ref 135–145)
Total Bilirubin: 0.5 mg/dL (ref 0.0–1.2)
Total Protein: 6 g/dL — ABNORMAL LOW (ref 6.5–8.1)

## 2023-09-12 LAB — MAGNESIUM: Magnesium: 1.8 mg/dL (ref 1.7–2.4)

## 2023-09-12 LAB — PHOSPHORUS: Phosphorus: 3.5 mg/dL (ref 2.5–4.6)

## 2023-09-12 MED ORDER — FEBUXOSTAT 40 MG PO TABS
40.0000 mg | ORAL_TABLET | Freq: Every day | ORAL | Status: DC
  Administered 2023-09-12: 40 mg via ORAL
  Filled 2023-09-12: qty 1

## 2023-09-12 MED ORDER — GABAPENTIN 400 MG PO CAPS
800.0000 mg | ORAL_CAPSULE | Freq: Two times a day (BID) | ORAL | Status: DC
  Administered 2023-09-12: 800 mg via ORAL
  Filled 2023-09-12: qty 2

## 2023-09-12 MED ORDER — GADOBUTROL 1 MMOL/ML IV SOLN
9.5000 mL | Freq: Once | INTRAVENOUS | Status: AC | PRN
  Administered 2023-09-12: 9.5 mL via INTRAVENOUS

## 2023-09-12 MED ORDER — SIMVASTATIN 20 MG PO TABS: 40.0000 mg | ORAL_TABLET | Freq: Every day | ORAL | Status: DC

## 2023-09-12 NOTE — Discharge Summary (Signed)
 Physician Discharge Summary  Thomas Pham FMW:[id number] DOB: November 18, 1955 DOA: 09/11/2023  PCP: Thomas Pham  Admit date: 09/11/2023 Discharge date: 09/12/2023  Time spent: 40 minutes  Recommendations for Outpatient Follow-up:  Follow outpatient CBC/CMP  Follow BG outpatient - holding glimeperide and jardiance due to hypoglycemia Follow BP outpatient - holding his combination thiazide/arb Follow platelets outpatient If recurrent episodes, consider neurology   Discharge Diagnoses:  Principal Problem:   Delirium   Discharge Condition: stable  Diet recommendation: heart healthy, diabetic  Filed Weights   09/11/23 1027 09/12/23 0406  Weight: 93.9 kg 95 kg    History of present illness:   68 yo with hx T2DM, HTN, OSA, dyslipidemia and multiple other medical issues presenting with AMS and abnormal movement.  Now thought to have symptomatic hypoglycemia.  EEG and MRI unremarkable.    Stable at time of discharge.       See below for additional details.  Hospital Course:  Assessment and Plan:  Symptomatic Hypoglycemia  Acute Metabolic Encephalopathy Presented with abnormal movement of RLE and confusion.  He was hypoglycemic on presentation with glucose of 61 on CMP.  He was altered on arrival to [facility] and had BG 49.  His symptoms improved after D50 given.    Case was discussed with neurology who recommended MRI brain with/without and EEG - consider outpatient follow up  EEG within normal limits MRI brain reassuring At discharge, needs to stop glimeperide and jardiance.     Acute Kidney Injury Improved, will recommend he continue to hold his thiazide/arb at discharge Follow outpatient.  Baseline creatinine appears to be 1.4.   T2DM c/b hypoglycemia Started mounjaro about 11 weeks ago.  I suspect this 3rd medication has improved his overall BG control, but increased his risk of hypoglycemia as he's been on glimeperide and jardiance for Thomas Pham while.  A1c 5.8 now.  As  above, would discontinue glimeperide and jardiance at discharge (could consider restarting jardiance at some point depending on how he does, but glimeperide particularly high risk for hypoglycemia and would avoid).   HTN Holding thiazide, arb, jardiance at discharge   Thrombocytopenia Warrants outpatient follow up   OSA Inspire    Hypothyroidism Synthroid    Gout Uloric    Peripheral Neuropathy Gabapentin     Prurigo Nodularis Dupixent       Procedures: EEG  IMPRESSION: This study is within normal limits. No seizures or epileptiform discharges were seen throughout the recording. Stefanny Pieri normal interictal EEG does not exclude nor support the diagnosis of epilepsy.    Consultations: Neurology phone  Discharge Exam: Vitals:   09/12/23 0405 09/12/23 0743  BP: 115/75 131/72  Pulse: 80 78  Resp: 15 16  Temp: 98.4 F (36.9 C) 99 F (37.2 C)  SpO2: 94% 95%   See progress note   Discharge Instructions   Discharge Instructions     Call MD for:  difficulty breathing, headache or visual disturbances   Complete by: As directed    Call MD for:  extreme fatigue   Complete by: As directed    Call MD for:  hives   Complete by: As directed    Call MD for:  persistant dizziness or light-headedness   Complete by: As directed    Call MD for:  persistant nausea and vomiting   Complete by: As directed    Call MD for:  redness, tenderness, or signs of infection (pain, swelling, redness, odor or green/yellow discharge around incision site)   Complete by: As directed  Call MD for:  severe uncontrolled pain   Complete by: As directed    Call MD for:  temperature >100.4   Complete by: As directed    Diet - low sodium heart healthy   Complete by: As directed    Discharge instructions   Complete by: As directed    You were seen for confusion and abnormal movement.  I believe this is related to low blood sugars.  You should STOP your glimeperide and your jardiance for now.   Follow up with your outpatient doctor to decide whether your jardiance can be resumed in the future (I would not resume the glimeperide at this time).    Your EEG did not show seizures and your MRI of your brian was unremarkable.    We'll stop your hydrochlorothiazide  olmesartan  for now.  Follow up with your PCP before resuming this.    Return for new, recurrent, or worsening symptoms.  If you have new episodes, you should either return of talk to your PCP about outpatient neurology follow up.    Please ask your PCP to request records from this hospitalization so they know what was done and what the next steps will be.   Increase activity slowly   Complete by: As directed       Allergies as of 09/12/2023   No Known Allergies      Medication List     PAUSE taking these medications    Jardiance 25 MG Tabs tablet Wait to take this until your doctor or other care provider tells you to start again. Stop your jardiance for now with your hypoglycemia.  Don't restart this unless you're instructed to by your PCP Generic drug: empagliflozin Take 25 mg by mouth daily.   olmesartan -hydrochlorothiazide  20-12.5 MG tablet Wait to take this until your doctor or other care provider tells you to start again. Follow with your outpatient doctor before resuming this.  Check your blood pressure at home and get repeat labs before you resume this medicine. Commonly known as: BENICAR  HCT Take 0.5 tablets by mouth daily.       STOP taking these medications    amitriptyline 25 MG tablet Commonly known as: ELAVIL   dexamethasone  0.5 MG/5ML elixir   dexamethasone  1 MG/ML solution Commonly known as: DECADRON    gentamicin  cream 0.1 % Commonly known as: GARAMYCIN    glimepiride 2 MG tablet Commonly known as: AMARYL   glimepiride 4 MG tablet Commonly known as: AMARYL   valACYclovir 1000 MG tablet Commonly known as: VALTREX       TAKE these medications    aspirin  EC 81 MG tablet Take  81 mg by mouth daily.   calcium carbonate 600 MG tablet Commonly known as: OS-CAL Take 600 mg by mouth 2 (two) times daily.   celecoxib 200 MG capsule Commonly known as: CELEBREX Take 200 mg by mouth daily as needed.   cetirizine 10 MG tablet Commonly known as: ZYRTEC Take 10 mg by mouth daily. For extreme itching   clobetasol ointment 0.05 % Commonly known as: TEMOVATE Apply 1 Application topically 2 (two) times daily as needed.   clotrimazole-betamethasone cream Commonly known as: LOTRISONE Apply 1 Application topically 2 (two) times daily as needed.   Dupixent  300 MG/2ML Soaj Generic drug: Dupilumab  Inject 300 mg into the skin every 14 (fourteen) days. Starting at day 15 for maintenance.   esomeprazole 40 MG capsule Commonly known as: NEXIUM Take 40 mg by mouth 2 (two) times daily before Jathniel Smeltzer meal.  famotidine  40 MG tablet Commonly known as: PEPCID  Take 40 mg by mouth daily.   febuxostat  40 MG tablet Commonly known as: ULORIC  Take 40 mg by mouth daily.   fexofenadine 180 MG tablet Commonly known as: ALLEGRA Take 180 mg by mouth every evening.   flunisolide 29 MCG/ACT nasal spray Commonly known as: NASAREL Place 2 sprays into the nose as needed for rhinitis. Dose is for each nostril.   flunisolide 25 MCG/ACT (0.025%) Soln Commonly known as: NASALIDE Place 1 spray into the nose 2 (two) times daily.   furosemide 20 MG tablet Commonly known as: LASIX Take 20 mg by mouth daily as needed.   gabapentin  800 MG tablet Commonly known as: NEURONTIN  Take 800 mg by mouth 2 (two) times daily.   HYDROcodone -acetaminophen  5-325 MG tablet Commonly known as: NORCO/VICODIN Take 1 tablet by mouth every 6 (six) hours as needed.   Jatenzo  237 MG Caps Generic drug: Testosterone Undecanoate  Take 237 mg by mouth 2 (two) times daily. Take with food   levothyroxine  75 MCG tablet Commonly known as: SYNTHROID  Take 75 mcg by mouth daily before breakfast.   melatonin 5 MG  Tabs Take 5 mg by mouth at bedtime.   Mounjaro 5 MG/0.5ML Pen Generic drug: tirzepatide Inject 5 mg into the skin once Zak Gondek week.   potassium chloride 10 MEQ tablet Commonly known as: KLOR-CON Take 10 mEq by mouth daily as needed.   Relistor  150 MG Tabs Generic drug: Methylnaltrexone  Bromide Take 450 mg by mouth daily. What changed: Another medication with the same name was removed. Continue taking this medication, and follow the directions you see here.   simvastatin  40 MG tablet Commonly known as: ZOCOR  Take 40 mg by mouth daily at 6 PM.   tadalafil 5 MG tablet Commonly known as: CIALIS Take 5 mg by mouth daily as needed for erectile dysfunction.   Vitamin D-3 25 MCG (1000 UT) Caps Take 1 capsule by mouth in the morning and at bedtime.       No Known Allergies    The results of significant diagnostics from this hospitalization (including imaging, microbiology, ancillary and laboratory) are listed below for reference.    Significant Diagnostic Studies: MR Brain W and Wo Contrast Result Date: 09/12/2023 EXAM: MRI BRAIN WITH AND WITHOUT CONTRAST 09/12/2023 09:53:42 AM TECHNIQUE: Multiplanar multisequence MRI of the head/brain was performed with and without the administration of 9.5 mL intravenous gadobutrol  (GADAVIST ) 1 MMOL/ML. COMPARISON: CT head without contrast 08/12/2023. CLINICAL HISTORY: Seizure, new-onset, no history of trauma. FINDINGS: BRAIN AND VENTRICLES: No acute infarct. No acute intracranial hemorrhage. No mass or abnormal enhancement. No midline shift. No hydrocephalus. The sella is unremarkable. Normal flow voids. Dedicated imaging of the temporal areas demonstrates symmetric size and signal of the hippocampal structures. ORBITS: No acute abnormality. SINUSES AND MASTOIDS: No acute abnormality. BONES AND SOFT TISSUES: Normal bone marrow signal. No acute soft tissue abnormality. IMPRESSION: 1. No acute or focal  intracranial abnormality related to the new-onset  seizure. Electronically signed by: Lonni Necessary MD 09/12/2023 10:13 AM EDT RP Workstation: [id number]   EEG adult Result Date: 09/11/2023 Thomas Lek, MD     09/11/2023 11:13 PM Patient Name: Thomas Pham MRN: [id number] Epilepsy Attending: Lek Thomas Referring Physician/Provider: Date: 09/11/2023 Duration: 22 minutes Patient history: 68 year old man with altered mental status, EEG to assess for seizure Level of alertness: Awake, drowsy, sleep AEDs during EEG study: None Technical aspects: This EEG study was done with scalp electrodes positioned according to  the 10-20 International system of electrode placement. Electrical activity was reviewed with band pass filter of 1-70Hz , sensitivity of 7 uV/mm, display speed of 50mm/sec with Ashantae Pangallo 60Hz  notched filter applied as appropriate. EEG data were recorded continuously and digitally stored.  Video monitoring was available and reviewed as appropriate. Description: The posterior dominant rhythm consists of 8.5 Hz activity of moderate voltage (25-35 uV) seen predominantly in posterior head regions, symmetric and reactive to eye opening and eye closing. Drowsiness was characterized by attenuation of the posterior background rhythm. Sleep was characterized by vertex waves, sleep spindles (12 to 14 Hz), maximal frontocentral region. Hyperventilation was not performed. Physiologic photic driving was not seen during photic stimulation.  ABNORMALITY -None IMPRESSION: This study is within normal limits. No seizures or epileptiform discharges were seen throughout the recording. Thomas Pham normal interictal EEG does not exclude nor support the diagnosis of epilepsy. Thomas Falling MD Neurology   CT Head Wo Contrast Result Date: 09/11/2023 CLINICAL DATA:  Mental status change, unknown cause. EXAM: CT HEAD WITHOUT CONTRAST TECHNIQUE: Contiguous axial images were obtained from the base of the skull through the vertex without intravenous contrast. RADIATION DOSE REDUCTION: This  exam was performed according to the departmental dose-optimization program which includes automated exposure control, adjustment of the mA and/or kV according to patient size and/or use of iterative reconstruction technique. COMPARISON:  CTA head and neck 06/12/2022 FINDINGS: Brain: There is no evidence of an acute infarct, intracranial hemorrhage, mass, midline shift, or extra-axial fluid collection. Cerebral volume is normal. The ventricles are normal in size. Vascular: Calcified atherosclerosis at the skull base. No hyperdense vessel. Skull: No fracture or suspicious lesion. Sinuses/Orbits: Visualized paranasal sinuses and mastoid air cells are clear. Unremarkable orbits. Other: None. IMPRESSION: Unremarkable CT appearance of the brain. Electronically Signed   By: Dasie Hamburg M.D.   On: 09/11/2023 10:56    Microbiology: No results found for this or any previous visit (from the past 240 hours).   Labs: Basic Metabolic Panel: Recent Labs  Lab 09/11/23 1058 09/11/23 1400 09/12/23 0537  NA 139  --  143  K 3.7  --  3.6  CL 102  --  106  CO2 25  --  25  GLUCOSE 61*  --  73  BUN 35*  --  20  CREATININE 2.30*  --  1.64*  CALCIUM 9.6  --  8.6*  MG  --  2.2 1.8  PHOS  --   --  3.5   Liver Function Tests: Recent Labs  Lab 09/11/23 1058 09/12/23 0537  AST 27 20  ALT 16 15  ALKPHOS 81 58  BILITOT 0.6 0.5  PROT 6.9 6.0*  ALBUMIN 4.2 3.3*   Recent Labs  Lab 09/11/23 1058  LIPASE 32   No results for input(s): AMMONIA in the last 168 hours. CBC: Recent Labs  Lab 09/11/23 1058 09/12/23 0537  WBC 8.6 6.6  NEUTROABS 6.9 4.7  HGB 13.7 13.5  HCT 42.7 42.9  MCV 82.3 82.0  PLT 119* 115*   Cardiac Enzymes: Recent Labs  Lab 09/11/23 1400  CKTOTAL 241   BNP: BNP (last 3 results) No results for input(s): BNP in the last 8760 hours.  ProBNP (last 3 results) No results for input(s): PROBNP in the last 8760 hours.  CBG: Recent Labs  Lab 09/11/23 1638 09/11/23 2118  09/11/23 2150 09/11/23 2247 09/12/23 0742  GLUCAP 156* 58* 84 110* 76       Signed:  Meliton Monte MD.  [facility] 09/12/2023,  11:30 AM

## 2023-09-12 NOTE — Plan of Care (Signed)
  Problem: Clinical Measurements: Goal: Ability to maintain clinical measurements within normal limits will improve Outcome: Progressing   Problem: Safety: Goal: Ability to remain free from injury will improve Outcome: Progressing   Problem: Fluid Volume: Goal: Ability to maintain a balanced intake and output will improve Outcome: Progressing   Problem: Metabolic: Goal: Ability to maintain appropriate glucose levels will improve Outcome: Progressing   Problem: Education: Goal: Knowledge of disease or condition will improve Outcome: Progressing Goal: Knowledge of secondary prevention will improve (MUST DOCUMENT ALL) Outcome: Progressing   Problem: Ischemic Stroke/TIA Tissue Perfusion: Goal: Complications of ischemic stroke/TIA will be minimized Outcome: Progressing   Problem: Coping: Goal: Will verbalize positive feelings about self Outcome: Progressing   Problem: Health Behavior/Discharge Planning: Goal: Ability to manage health-related needs will improve Outcome: Progressing Goal: Goals will be collaboratively established with patient/family Outcome: Progressing   Problem: Self-Care: Goal: Ability to participate in self-care as condition permits will improve Outcome: Progressing   Problem: Nutrition: Goal: Risk of aspiration will decrease Outcome: Progressing

## 2023-09-12 NOTE — Care Management Obs Status (Signed)
 MEDICARE OBSERVATION STATUS NOTIFICATION   Patient Details  Name: Thomas Pham MRN: [id number] Date of Birth: 1955/08/18   Medicare Observation Status Notification Given:  Yes    Marval Gell, RN 09/12/2023, 11:57 AM

## 2023-09-12 NOTE — Progress Notes (Signed)
 SLP Cancellation Note  Patient Details Name: KALAI BACA MRN: [id number] DOB: [birth year]   Cancelled treatment:       Reason Eval/Treat Not Completed: Patient at procedure or test/unavailable.  Pt is currently at MRI.  SLP will f/u as schedule allows.    Earnie SQUIBB Izabella Marcantel 09/12/2023, 9:49 AM

## 2023-09-12 NOTE — Progress Notes (Signed)
 PROGRESS NOTE    Thomas Pham  FMW:[id number] DOB: 02-19-1955 DOA: 09/11/2023 PCP: Thomas Pham ORN, PA-C  Chief Complaint  Patient presents with   Altered Mental Status    Brief Narrative:   68 yo with hx T2DM, HTN, OSA, dyslipidemia and multiple other medical issues presenting with AMS and abnormal movement.  Now thought to have symptomatic anemia.    See below for additional details.   Assessment & Plan:   Principal Problem:   Delirium  Symptomatic Hypoglycemia  Acute Metabolic Encephalopathy Presented with abnormal movement of RLE and confusion.  He was hypoglycemic on presentation with glucose of 61 on CMP.  He was altered on arrival to [facility] and had BG 49.  His symptoms improved after D50 given.    Case was discussed with neurology who recommended MRI brain with/without and EEG - consider outpatient follow up  EEG within normal limits Currently is back to his baseline.  Awaiting MRI brain.   At discharge, needs to stop glimeperide and jardiance.    Acute Kidney Injury Improved, will recommend he continue to hold his thiazide/arb at discharge Follow outpatient.  Baseline creatinine appears to be 1.4.  T2DM c/b hypoglycemia Started mounjaro about 11 weeks ago.  ? Whether this 3rd medication has increased his risk of hypoglycemia as he's been on glimeperide and jardiance for Thomas Pham while.  A1c 5.8 now.  As above, would discontinue glimeperide and jardiance at discharge (could consider restarting jardiance at some point depending on how he does, but glimeperide particularly high risk for hypoglycemia and would avoid)  HTN Holding thiazide, arb, jardiance at discharge  Thrombocytopenia Warrants outpatient follow up  OSA Inspire   Hypothyroidism Synthroid   Gout Uloric   Peripheral Neuropathy Gabapentin    Prurigo Nodularis Dupixent      DVT prophylaxis: SCD Code Status: full Family Communication: none Disposition:   Status is: Inpatient Remains inpatient  appropriate because: awaiting MRI   Consultants:  Neurology phone  Procedures:  EEG IMPRESSION: This study is within normal limits. No seizures or epileptiform discharges were seen throughout the recording. Thomas Pham normal interictal EEG does not exclude nor support the diagnosis of epilepsy.  Antimicrobials:  Anti-infectives (From admission, onward)    None       Subjective: Feels back to baseline  Objective: Vitals:   09/11/23 2317 09/12/23 0405 09/12/23 0406 09/12/23 0743  BP: 123/70 115/75  131/72  Pulse: 95 80  78  Resp: 16 15  16   Temp: 98.4 F (36.9 C) 98.4 F (36.9 C)  99 F (37.2 C)  TempSrc: Oral Oral  Oral  SpO2: 94% 94%  95%  Weight:   95 kg   Height:        Intake/Output Summary (Last 24 hours) at 09/12/2023 0753 Last data filed at 09/12/2023 0618 Gross per 24 hour  Intake 2408.65 ml  Output --  Net 2408.65 ml   Filed Weights   09/11/23 1027 09/12/23 0406  Weight: 93.9 kg 95 kg    Examination:  General exam: Appears calm and comfortable  Respiratory system: Clear to auscultation. Respiratory effort normal. Cardiovascular system: RRR. Gastrointestinal system: Abdomen is nondistended, soft and nontender Central nervous system: Alert and oriented. CN 2-12 intact.  5/5 strength throughout.  FNF intact bilaterally. Extremities: no LEE  Data Reviewed: I have personally reviewed following labs and imaging studies  CBC: Recent Labs  Lab 09/11/23 1058 09/12/23 0537  WBC 8.6 6.6  NEUTROABS 6.9 4.7  HGB 13.7 13.5  HCT 42.7 42.9  MCV  82.3 82.0  PLT 119* 115*    Basic Metabolic Panel: Recent Labs  Lab 09/11/23 1058 09/11/23 1400 09/12/23 0537  NA 139  --  143  K 3.7  --  3.6  CL 102  --  106  CO2 25  --  25  GLUCOSE 61*  --  73  BUN 35*  --  20  CREATININE 2.30*  --  1.64*  CALCIUM 9.6  --  8.6*  MG  --  2.2 1.8  PHOS  --   --  3.5    GFR: Estimated Creatinine Clearance: 51.1 mL/min (Thomas Pham) (by C-G formula based on SCr of 1.64 mg/dL  (H)).  Liver Function Tests: Recent Labs  Lab 09/11/23 1058 09/12/23 0537  AST 27 20  ALT 16 15  ALKPHOS 81 58  BILITOT 0.6 0.5  PROT 6.9 6.0*  ALBUMIN 4.2 3.3*    CBG: Recent Labs  Lab 09/11/23 1638 09/11/23 2118 09/11/23 2150 09/11/23 2247 09/12/23 0742  GLUCAP 156* 58* 84 110* 76     No results found for this or any previous visit (from the past 240 hours).       Radiology Studies: EEG adult Result Date: 09/11/2023 [name], [name], MD     09/11/2023 11:13 PM Patient Name: Thomas Pham MRN: [id number] Epilepsy Attending: Pastor Pham Referring Physician/Provider: Date: 09/11/2023 Duration: 22 minutes Patient history: 68 year old man with altered mental status, EEG to assess for seizure Level of alertness: Awake, drowsy, sleep AEDs during EEG study: None Technical aspects: This EEG study was done with scalp electrodes positioned according to the 10-20 International system of electrode placement. Electrical activity was reviewed with band pass filter of 1-70Hz , sensitivity of 7 uV/mm, display speed of 48mm/sec with [place] Cilento 60Hz  notched filter applied as appropriate. EEG data were recorded continuously and digitally stored.  Video monitoring was available and reviewed as appropriate. Description: The posterior dominant rhythm consists of 8.5 Hz activity of moderate voltage (25-35 uV) seen predominantly in posterior head regions, symmetric and reactive to eye opening and eye closing. Drowsiness was characterized by attenuation of the posterior background rhythm. Sleep was characterized by vertex waves, sleep spindles (12 to 14 Hz), maximal frontocentral region. Hyperventilation was not performed. Physiologic photic driving was not seen during photic stimulation.  ABNORMALITY -None IMPRESSION: This study is within normal limits. No seizures or epileptiform discharges were seen throughout the recording. Thomas Pham normal interictal EEG does not exclude nor support the diagnosis of epilepsy. Thomas Falling MD Neurology   CT Head Wo Contrast Result Date: 09/11/2023 CLINICAL DATA:  Mental status change, unknown cause. EXAM: CT HEAD WITHOUT CONTRAST TECHNIQUE: Contiguous axial images were obtained from the base of the skull through the vertex without intravenous contrast. RADIATION DOSE REDUCTION: This exam was performed according to the departmental dose-optimization program which includes automated exposure control, adjustment of the mA and/or kV according to patient size and/or use of iterative reconstruction technique. COMPARISON:  CTA head and neck 06/12/2022 FINDINGS: Brain: There is no evidence of an acute infarct, intracranial hemorrhage, mass, midline shift, or extra-axial fluid collection. Cerebral volume is normal. The ventricles are normal in size. Vascular: Calcified atherosclerosis at the skull base. No hyperdense vessel. Skull: No fracture or suspicious lesion. Sinuses/Orbits: Visualized paranasal sinuses and mastoid air cells are clear. Unremarkable orbits. Other: None. IMPRESSION: Unremarkable CT appearance of the brain. Electronically Signed   By: Dasie Hamburg M.D.   On: 09/11/2023 10:56        Scheduled Meds:  thiamine  (VITAMIN B1) injection  100 mg Intravenous Q24H   Continuous Infusions:   LOS: 1 day    Time spent: over 30 min    Meliton Monte, MD [facility]   To contact the attending provider between 7A-7P or the covering provider during after hours 7P-7A, please log into the web site [email or web address] and access using universal [place] password for that web site. If you do not have the password, please call the hospital operator.  09/12/2023, 7:53 AM

## 2023-09-12 NOTE — Evaluation (Signed)
 Clinical/Bedside Swallow Evaluation Patient Details  Name: Thomas Pham MRN: [id number] Date of Birth: August 12, 1955  Today's Date: 09/12/2023 Time: SLP Start Time (ACUTE ONLY): 1000 SLP Stop Time (ACUTE ONLY): 1012 SLP Time Calculation (min) (ACUTE ONLY): 12 min  Past Medical History:  Past Medical History:  Diagnosis Date   Allergy    Arthritis    osteo   Basal cell carcinoma 09/13/2014   BCC NODULAR LEFT CHEEK PER ST CLEAR   Basal cell carcinoma (BCC) 09/30/2020   SUP & NOD - R UPPER ABDOMEN (CX3FU)   Basal cell carcinoma of skin 09/14/2019   right malar cheek -MOHS   BCC (basal cell carcinoma of skin) 09/09/2017   BCC MICRONODULAR LEFT CHEEK CX3 AND EXC   BCC (basal cell carcinoma of skin) 09/09/2017   BCC POSITIVE MARGIN MOHS DR PEARCE   Chronic kidney disease    Difficulty sleeping    GERD (gastroesophageal reflux disease)    takes nexium for acid reflux as needed   History of gout    History of hiatal hernia    umbilical hernia   History of kidney stones    Hyperlipidemia    Hypertension    Hypothyroidism    Low back pain    Medial meniscus tear    LEFT   Organic impotence    Penile cyst    Sleep apnea    not using mask d/t preference   Thyroid disease    Past Surgical History:  Past Surgical History:  Procedure Laterality Date   KNEE ARTHROSCOPY Left 03/07/2014   Procedure: LEFT ARTHROSCOPY KNEE WITH MEDIAL MENISCECTOMY  DEBRIDEMENT AND CHONDROPLASTY ;  Surgeon: Dempsey Melodi GAILS, MD;  Location: [facility];  Service: Orthopedics;  Laterality: Left;   THYROIDECTOMY Right 2013   TONSILLECTOMY     childhood   TOTAL KNEE ARTHROPLASTY Left 03/04/2016   Procedure: LEFT TOTAL KNEE ARTHROPLASTY;  Surgeon: Dempsey Melodi, MD;  Location: [facility];  Service: Orthopedics;  Laterality: Left;   HPI:  68 year old male with past medical history of prostate cancer and diabetes presenting for emergency department today after having an episode of altered mental status that is since  resolved. Head CT and brain MRI unremarkable for acute abnormalities.    Assessment / Plan / Recommendation  Clinical Impression  Pt was seen for a bedside swallow evaluation and he presents with suspected functional oropharyngeal swallowing abilities.  Pt was encountered awake/alert.  Oral mechanism exam was WNL.  Pt consumed trials of thin liquid, puree, and regular solids.  Pt fed himself independently and he demonstrated good bolus acceptance, timely mastication, suspected timely AP transport/swallow initiation, and consistent hyolaryngeal elevation/excursion to observation and palpation.  No overt s/sx of aspiration were observed with any trials.  Recommend continuation of regular solids and thin liquids with medications administered whole with liquid.  No further skilled ST is warranted at this time.  Please re-consult if additional needs arise.    SLP Visit Diagnosis: Dysphagia, unspecified (R13.10)    Aspiration Risk  No limitations    Diet Recommendation Thin liquid;Regular    Liquid Administration via: Cup;Straw Medication Administration: Whole meds with liquid Supervision: Patient able to self feed    Other  Recommendations Oral Care Recommendations: Oral care BID     Assistance Recommended at Discharge    Functional Status Assessment Patient has had a recent decline in their functional status and demonstrates the ability to make significant improvements in function in a reasonable and predictable amount of time.  Frequency and Duration            Prognosis Prognosis for improved oropharyngeal function: Good      Swallow Study   General Date of Onset: 09/12/23 HPI: 68 year old male with past medical history of prostate cancer and diabetes presenting for emergency department today after having an episode of altered mental status that is since resolved. Head CT and brain MRI unremarkable for acute abnormalities. Type of Study: Bedside Swallow Evaluation Previous Swallow  Assessment: none Diet Prior to this Study: Regular;Thin liquids (Level 0) Temperature Spikes Noted: No Respiratory Status: Room air History of Recent Intubation: No Behavior/Cognition: Alert Oral Cavity Assessment: Within Functional Limits Oral Care Completed by SLP: No Oral Cavity - Dentition: Adequate natural dentition Vision: Functional for self-feeding Self-Feeding Abilities: Able to feed self Patient Positioning: Upright in bed Baseline Vocal Quality: Normal    Oral/Motor/Sensory Function Overall Oral Motor/Sensory Function: Within functional limits   Ice Chips Ice chips: Not tested   Thin Liquid Thin Liquid: Within functional limits    Nectar Thick Nectar Thick Liquid: Not tested   Honey Thick Honey Thick Liquid: Not tested   Puree Puree: Not tested   Solid     Solid: Within functional limits Presentation: Self Fed     Earnie Cable, M.S., CCC-SLP Acute Rehabilitation Services Office: [phone]  Earnie SQUIBB Shyonna Carlin 09/12/2023,11:12 AM

## 2023-09-12 NOTE — Care Management CC44 (Signed)
 Condition Code 44 Documentation Completed  Patient Details  Name: MACON SANDIFORD MRN: [id number] Date of Birth: 04/11/1955   Condition Code 44 given:  Yes Patient signature on Condition Code 44 notice:  Yes Documentation of 2 MD's agreement:  Yes Code 44 added to claim:  Yes    Marval Gell, RN 09/12/2023, 11:58 AM

## 2023-09-12 NOTE — Evaluation (Signed)
 Speech Language Pathology Evaluation Patient Details Name: Thomas Pham MRN: [id number] DOB: August 25, 1955 Today's Date: 09/12/2023 Time: 1012-1024 SLP Time Calculation (min) (ACUTE ONLY): 12 min  Problem List:  Patient Active Problem List   Diagnosis Date Noted   Delirium 09/11/2023   Low testosterone 06/26/2021   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   BPH without obstruction/lower urinary tract symptoms 06/26/2021   Severe obstructive sleep apnea-hypopnea syndrome 10/12/2018   OA (osteoarthritis) of knee 03/04/2016   Acute medial meniscal tear 03/06/2014   Cold hands and feet 08/23/2012   Past Medical History:  Past Medical History:  Diagnosis Date   Allergy    Arthritis    osteo   Basal cell carcinoma 09/13/2014   BCC NODULAR LEFT CHEEK PER ST CLEAR   Basal cell carcinoma (BCC) 09/30/2020   SUP & NOD - R UPPER ABDOMEN (CX3FU)   Basal cell carcinoma of skin 09/14/2019   right malar cheek -MOHS   BCC (basal cell carcinoma of skin) 09/09/2017   BCC MICRONODULAR LEFT CHEEK CX3 AND EXC   BCC (basal cell carcinoma of skin) 09/09/2017   BCC POSITIVE MARGIN MOHS DR PEARCE   Chronic kidney disease    Difficulty sleeping    GERD (gastroesophageal reflux disease)    takes nexium for acid reflux as needed   History of gout    History of hiatal hernia    umbilical hernia   History of kidney stones    Hyperlipidemia    Hypertension    Hypothyroidism    Low back pain    Medial meniscus tear    LEFT   Organic impotence    Penile cyst    Sleep apnea    not using mask d/t preference   Thyroid disease    Past Surgical History:  Past Surgical History:  Procedure Laterality Date   KNEE ARTHROSCOPY Left 03/07/2014   Procedure: LEFT ARTHROSCOPY KNEE WITH MEDIAL MENISCECTOMY  DEBRIDEMENT AND CHONDROPLASTY ;  Surgeon: Dempsey Melodi GAILS, MD;  Location: [facility];  Service: Orthopedics;  Laterality: Left;   THYROIDECTOMY Right 2013   TONSILLECTOMY     childhood   TOTAL KNEE ARTHROPLASTY Left  03/04/2016   Procedure: LEFT TOTAL KNEE ARTHROPLASTY;  Surgeon: Dempsey Melodi, MD;  Location: [facility];  Service: Orthopedics;  Laterality: Left;   HPI:  68 year old male with past medical history of prostate cancer and diabetes presenting for emergency department today after having an episode of altered mental status that is since resolved. Head CT and brain MRI unremarkable for acute abnormalities.   Assessment / Plan / Recommendation Clinical Impression  Pt presents with functional cognitive-linguistic abilities.  He completed the [employer] Mental Status Examination (SLUMS) and he scored 30/30 which is within normal limits.  Expressive and receptive language were functional and no dysarthria was observed.  No additional ST is recommend at this time or at time of discharge.  SLP to sign off, please re-consult if additional needs arise.  [facility] SLUMS Examination Orientation  3/3  Numeric Problem Solving  3/3  Memory  5/5  Attention 2/2  Thought Organization 3/3  Clock Drawing 4/4  Visuospatial Skills    2/2  Short Story Recall  8/8  Total  30/30 (Normal Range)    Scoring  High School Education  Less than High School Education   Normal  27-30 25-30  Mild Neurocognitive Disorder 21-26 20-24  Dementia  1-20 1-19       SLP Assessment  SLP Visit Diagnosis: Dysphagia, unspecified (R13.10)  Assistance Recommended at Discharge     Functional Status Assessment Patient has had a recent decline in their functional status and demonstrates the ability to make significant improvements in function in a reasonable and predictable amount of time.  Frequency and Duration           SLP Evaluation Cognition  Overall Cognitive Status: Within Functional Limits for tasks assessed Orientation Level: Oriented X4       Comprehension  Auditory Comprehension Overall Auditory Comprehension: Appears within functional limits for tasks assessed    Expression Verbal Expression Overall  Verbal Expression: Appears within functional limits for tasks assessed   Oral / Motor  Oral Motor/Sensory Function Overall Oral Motor/Sensory Function: Within functional limits Motor Speech Overall Motor Speech: Appears within functional limits for tasks assessed           Thomas Pham, M.S., CCC-SLP Acute Rehabilitation Services Office: [phone]  Thomas Pham [facility] 09/12/2023, 11:17 AM

## 2023-09-12 NOTE — Evaluation (Signed)
 Physical Therapy Evaluation & Discharge Patient Details Name: Thomas Pham MRN: [id number] DOB: 30-Aug-1955 Today's Date: 09/12/2023  History of Present Illness  Pt is a 68 y.o. male who presented 09/11/23 with flailing of limbs and AMS. Blood sugar checked to be 49 and given dextrose  at which point patient was back to baseline as far as mentation and mobility. MRI negative for acute abnormality. EEG within normal limits with no seizures or epileptiform discharges noted. PMH: arthritis, basal cell carcinoma, CKD, GERD, gout, HLD, HTN, hypothyroidism, medial meniscus tear, sleep apnea, DM2   Clinical Impression  Pt presents with condition above. Pt reports and appears to be back to his baseline, demonstrating WFL cognition and intact, WFL, and symmetrical bil upper and lower extremity strength, sensation (hx peripheral neuropathy in both feet), and coordination. He is independent with all functional mobility without evidence for imbalance. All education completed and questions answered. PT will sign off. Thank you for this referral.        If plan is discharge home, recommend the following:  (N/A)   Can travel by private vehicle        Equipment Recommendations None recommended by PT  Recommendations for Other Services       Functional Status Assessment Patient has not had a recent decline in their functional status     Precautions / Restrictions Precautions Precautions: None Restrictions Weight Bearing Restrictions Per Provider Order: No      Mobility  Bed Mobility               General bed mobility comments: Pt sitting EOB upon arrival and at end of session    Transfers Overall transfer level: Independent Equipment used: None               General transfer comment: No LOB, no assistance needed    Ambulation/Gait Ambulation/Gait assistance: Independent Gait Distance (Feet): 140 Feet Assistive device: None Gait Pattern/deviations: WFL(Within Functional  Limits) Gait velocity: WFL Gait velocity interpretation: >4.37 ft/sec, indicative of normal walking speed   General Gait Details: No overt LOB, even when cued to change speeds, directions, and head positions.  Stairs            Wheelchair Mobility     Tilt Bed    Modified Rankin (Stroke Patients Only) Modified Rankin (Stroke Patients Only) Pre-Morbid Rankin Score: No symptoms Modified Rankin: No symptoms     Balance Overall balance assessment: Independent, No apparent balance deficits (not formally assessed)                                           Pertinent Vitals/Pain Pain Assessment Pain Assessment: Faces Faces Pain Scale: No hurt Pain Intervention(s): Monitored during session    Home Living Family/patient expects to be discharged to:: Private residence Living Arrangements: Parent (mother) Available Help at Discharge: Family;Available 24 hours/day Type of Home: House Home Access: Level entry     Alternate Level Stairs-Number of Steps: flight Home Layout: Two level (he goes down flight of stairs to his level of the house where his bedroom is) Home Equipment: Rollator (4 wheels);Shower seat      Prior Function Prior Level of Function : Independent/Modified Independent;Driving             Mobility Comments: No AD       Extremity/Trunk Assessment   Upper Extremity Assessment Upper Extremity Assessment: Overall [facility]  for tasks assessed;Right hand dominant (MMT scores of 5/5 grossly bil; sensation and coordination intact bil)    Lower Extremity Assessment Lower Extremity Assessment: Overall WFL for tasks assessed (MMT scores of 5/5 grossly bil; sensation and coordination intact bil except hx of peripheral neuropathy impacting sensation in bil feet)    Cervical / Trunk Assessment Cervical / Trunk Assessment: Normal  Communication   Communication Communication: No apparent difficulties    Cognition Arousal: Alert Behavior  During Therapy: WFL for tasks assessed/performed   PT - Cognitive impairments: No apparent impairments                       PT - Cognition Comments: dual tasks counting backwards by 2s from 40 without mistake or LOB while ambulating, appropriate speed for cog and mobility task; A&O Following commands: Intact       Cueing Cueing Techniques: Verbal cues     General Comments General comments (skin integrity, edema, etc.): VSS on RA    Exercises     Assessment/Plan    PT Assessment Patient does not need any further PT services  PT Problem List         PT Treatment Interventions      PT Goals (Current goals can be found in the Care Plan section)  Acute Rehab PT Goals Patient Stated Goal: to go home PT Goal Formulation: All assessment and education complete, DC therapy Time For Goal Achievement: 09/13/23 Potential to Achieve Goals: Good    Frequency       Co-evaluation               AM-PAC PT 6 Clicks Mobility  Outcome Measure Help needed turning from your back to your side while in a flat bed without using bedrails?: None Help needed moving from lying on your back to sitting on the side of a flat bed without using bedrails?: None Help needed moving to and from a bed to a chair (including a wheelchair)?: None Help needed standing up from a chair using your arms (e.g., wheelchair or bedside chair)?: None Help needed to walk in hospital room?: None Help needed climbing 3-5 steps with a railing? : None 6 Click Score: 24    End of Session   Activity Tolerance: Patient tolerated treatment well Patient left: in bed;with call bell/phone within reach (sitting EOB) Nurse Communication: Mobility status (no bed alarm on) PT Visit Diagnosis: Other (comment);Difficulty in walking, not elsewhere classified (R26.2) (Impaired cognition)    Time: 1040-1050 PT Time Calculation (min) (ACUTE ONLY): 10 min   Charges:   PT Evaluation $PT Eval Low Complexity: 1  Low   PT General Charges $$ ACUTE PT VISIT: 1 Visit         Theo Ferretti, PT, DPT Acute Rehabilitation Services  Office: [phone]   Theo CHRISTELLA Ferretti 09/12/2023, 11:18 AM

## 2023-09-12 NOTE — Progress Notes (Signed)
 Initial Nutrition Assessment  DOCUMENTATION CODES:   Obesity unspecified  INTERVENTION:   -Continue carb modified diet -Ensure Plus High Protein po BID, each supplement provides 350 kcal and 20 grams of protein  -MVI with minerals daily  NUTRITION DIAGNOSIS:   Increased nutrient needs related to acute illness as evidenced by estimated needs.  GOAL:   Patient will meet greater than or equal to 90% of their needs  MONITOR:   PO intake, Supplement acceptance  REASON FOR ASSESSMENT:   Consult Assessment of nutrition requirement/status  ASSESSMENT:   Pt with past medical history  of diabetes mellitus type 2, hypogonadism, BPH, essential hypertension, hyperlipidemia, presenting today admitted for altered mental status patient per description when patient was excepted was flailing his limbs and acting crazy and patient admitted for delirium.  Pt admitted with delirium.   7/27- s/p BSE- regular diet with thin liquids  Reviewed I/O's: +2.4 L x 24 hours  Pt unavailable at time of visit. Attempted to speak with pt via call to hospital room phone, however, unable to reach. RD unable to obtain further nutrition-related history or complete nutrition-focused physical exam at this time.    Pt currently on a carb modified diet. No meal completion data available to assess at this time. Case discussed with RN; pt with poor appetite, consumed a small yogurt and muffin for breakfast with encouragement.  Reviewed wt hx; pt has experienced mild wt loss in the distant past. No recent weight history to assess acute weight loss.   Medications reviewed and include neurontin  and thiamine .  Lab Results  Component Value Date   HGBA1C 5.8 (H) 09/11/2023   PTA DM medications are 4 mg amaryl daily and 25 mg jardiance daily.   Labs reviewed: CBGS: 76-134 (inpatient orders for glycemic control are none).    Diet Order:   Diet Order             Diet - low sodium heart healthy           Diet  Carb Modified Fluid consistency: Thin; Room service appropriate? Yes  Diet effective now                   EDUCATION NEEDS:   No education needs have been identified at this time  Skin:  Skin Assessment: Reviewed RN Assessment  Last BM:  09/12/23 (type 4)  Height:   Ht Readings from Last 1 Encounters:  09/11/23 5' 9 (1.753 m)    Weight:   Wt Readings from Last 1 Encounters:  09/12/23 95 kg    Ideal Body Weight:  72.7 kg  BMI:  Body mass index is 30.93 kg/m.  Estimated Nutritional Needs:   Kcal:  1800-2000  Protein:  100-115 grams  Fluid:  1.8- 2.0 L    Margery ORN, RD, LDN, CDCES Registered Dietitian III Certified Diabetes Care and Education Specialist If unable to reach this RD, please use RD Inpatient group chat on secure chat between hours of 8am-4 pm daily

## 2023-09-12 NOTE — Progress Notes (Signed)
 OT Screen Note  Patient Details Name: [name] MRN: [id number] DOB: Mar 03, 1955   Cancelled Treatment:    Reason Eval/Treat Not Completed: OT screened, no needs identified, will sign off (Discussed with PT that pt is back to functional baseline. no acute skilled OT needs identified. OT signing off)  09/12/2023  AB, OTR/L  Acute Rehabilitation Services  Office: [phone]   Curtistine JONETTA Das 09/12/2023, 12:39 PM
